# Patient Record
Sex: Female | Born: 1945 | Race: White | Hispanic: No | Smoking: Former smoker
Health system: Southern US, Community
[De-identification: ages and names within clinical notes are randomized; demographics above are authoritative.]

## PROBLEM LIST (undated history)

## (undated) DIAGNOSIS — J42 Unspecified chronic bronchitis: Secondary | ICD-10-CM

## (undated) DIAGNOSIS — J189 Pneumonia, unspecified organism: Secondary | ICD-10-CM

## (undated) DIAGNOSIS — R0601 Orthopnea: Secondary | ICD-10-CM

## (undated) DIAGNOSIS — I2510 Atherosclerotic heart disease of native coronary artery without angina pectoris: Secondary | ICD-10-CM

## (undated) DIAGNOSIS — F419 Anxiety disorder, unspecified: Secondary | ICD-10-CM

## (undated) DIAGNOSIS — E785 Hyperlipidemia, unspecified: Secondary | ICD-10-CM

## (undated) DIAGNOSIS — M549 Dorsalgia, unspecified: Secondary | ICD-10-CM

## (undated) DIAGNOSIS — I219 Acute myocardial infarction, unspecified: Secondary | ICD-10-CM

## (undated) DIAGNOSIS — R0600 Dyspnea, unspecified: Secondary | ICD-10-CM

## (undated) DIAGNOSIS — F329 Major depressive disorder, single episode, unspecified: Secondary | ICD-10-CM

## (undated) DIAGNOSIS — G8929 Other chronic pain: Secondary | ICD-10-CM

## (undated) DIAGNOSIS — J449 Chronic obstructive pulmonary disease, unspecified: Secondary | ICD-10-CM

## (undated) DIAGNOSIS — M1990 Unspecified osteoarthritis, unspecified site: Secondary | ICD-10-CM

## (undated) DIAGNOSIS — R062 Wheezing: Secondary | ICD-10-CM

## (undated) DIAGNOSIS — N289 Disorder of kidney and ureter, unspecified: Secondary | ICD-10-CM

## (undated) DIAGNOSIS — R011 Cardiac murmur, unspecified: Secondary | ICD-10-CM

## (undated) DIAGNOSIS — F32A Depression, unspecified: Secondary | ICD-10-CM

## (undated) DIAGNOSIS — Z8711 Personal history of peptic ulcer disease: Secondary | ICD-10-CM

## (undated) DIAGNOSIS — I10 Essential (primary) hypertension: Secondary | ICD-10-CM

## (undated) HISTORY — DX: Major depressive disorder, single episode, unspecified: F32.9

## (undated) HISTORY — DX: Depression, unspecified: F32.A

## (undated) HISTORY — PX: LACERATION REPAIR: SHX5168

## (undated) HISTORY — DX: Atherosclerotic heart disease of native coronary artery without angina pectoris: I25.10

## (undated) HISTORY — PX: ABDOMINAL HYSTERECTOMY: SHX81

## (undated) HISTORY — DX: Essential (primary) hypertension: I10

## (undated) HISTORY — DX: Dorsalgia, unspecified: M54.9

## (undated) HISTORY — DX: Other chronic pain: G89.29

## (undated) HISTORY — PX: BLADDER SUSPENSION: SHX72

## (undated) HISTORY — DX: Anxiety disorder, unspecified: F41.9

## (undated) HISTORY — DX: Disorder of kidney and ureter, unspecified: N28.9

## (undated) HISTORY — DX: Hyperlipidemia, unspecified: E78.5

## (undated) HISTORY — PX: TIBIA FRACTURE SURGERY: SHX806

---

## 2001-11-30 HISTORY — PX: CORONARY ARTERY BYPASS GRAFT: SHX141

## 2006-05-04 ENCOUNTER — Emergency Department: Payer: Self-pay | Admitting: Emergency Medicine

## 2011-05-07 ENCOUNTER — Observation Stay: Payer: Self-pay | Admitting: Internal Medicine

## 2011-05-07 DIAGNOSIS — R079 Chest pain, unspecified: Secondary | ICD-10-CM

## 2011-05-27 ENCOUNTER — Encounter: Payer: Self-pay | Admitting: Cardiovascular Disease

## 2014-01-22 DIAGNOSIS — M159 Polyosteoarthritis, unspecified: Secondary | ICD-10-CM | POA: Insufficient documentation

## 2014-01-22 DIAGNOSIS — J449 Chronic obstructive pulmonary disease, unspecified: Secondary | ICD-10-CM | POA: Insufficient documentation

## 2014-01-22 DIAGNOSIS — F17200 Nicotine dependence, unspecified, uncomplicated: Secondary | ICD-10-CM | POA: Insufficient documentation

## 2014-01-22 DIAGNOSIS — I2510 Atherosclerotic heart disease of native coronary artery without angina pectoris: Secondary | ICD-10-CM | POA: Insufficient documentation

## 2015-03-15 ENCOUNTER — Emergency Department
Admit: 2015-03-15 | Disposition: A | Discharge status: Home / Self Care | Payer: Self-pay | Admitting: Emergency Medicine

## 2015-09-07 ENCOUNTER — Emergency Department
Admission: EM | Admit: 2015-09-07 | Discharge: 2015-09-07 | Disposition: A | Discharge status: Home / Self Care | Payer: Medicare Other | Attending: Emergency Medicine | Admitting: Emergency Medicine

## 2015-09-07 ENCOUNTER — Encounter: Payer: Self-pay | Admitting: *Deleted

## 2015-09-07 ENCOUNTER — Emergency Department: Payer: Medicare Other

## 2015-09-07 DIAGNOSIS — Z720 Tobacco use: Secondary | ICD-10-CM | POA: Diagnosis not present

## 2015-09-07 DIAGNOSIS — Z7951 Long term (current) use of inhaled steroids: Secondary | ICD-10-CM | POA: Insufficient documentation

## 2015-09-07 DIAGNOSIS — Z7982 Long term (current) use of aspirin: Secondary | ICD-10-CM | POA: Insufficient documentation

## 2015-09-07 DIAGNOSIS — Z79899 Other long term (current) drug therapy: Secondary | ICD-10-CM | POA: Diagnosis not present

## 2015-09-07 DIAGNOSIS — J441 Chronic obstructive pulmonary disease with (acute) exacerbation: Secondary | ICD-10-CM | POA: Insufficient documentation

## 2015-09-07 DIAGNOSIS — I10 Essential (primary) hypertension: Secondary | ICD-10-CM | POA: Insufficient documentation

## 2015-09-07 DIAGNOSIS — R0682 Tachypnea, not elsewhere classified: Secondary | ICD-10-CM | POA: Diagnosis not present

## 2015-09-07 DIAGNOSIS — R0602 Shortness of breath: Secondary | ICD-10-CM | POA: Diagnosis present

## 2015-09-07 DIAGNOSIS — J069 Acute upper respiratory infection, unspecified: Secondary | ICD-10-CM

## 2015-09-07 LAB — TROPONIN I: Troponin I: <0.03 ng/mL (ref <0.031)

## 2015-09-07 LAB — CBC
HEMATOCRIT: 42.4 % (ref 35.0–47.0)
Hemoglobin: 13.8 g/dL (ref 12.0–16.0)
MCH: 29.6 pg (ref 26.0–34.0)
MCHC: 32.5 g/dL (ref 32.0–36.0)
MCV: 90.9 fL (ref 80.0–100.0)
PLATELETS: 338 10*3/uL (ref 150–440)
RBC: 4.66 MIL/uL (ref 3.80–5.20)
RDW: 14.7 % — ABNORMAL HIGH (ref 11.5–14.5)
WBC: 12.4 10*3/uL — ABNORMAL HIGH (ref 3.6–11.0)

## 2015-09-07 LAB — COMPREHENSIVE METABOLIC PANEL
ALBUMIN: 4.9 g/dL (ref 3.5–5.0)
ALK PHOS: 92 U/L (ref 38–126)
ALT: 15 U/L (ref 14–54)
AST: 20 U/L (ref 15–41)
Anion gap: 9 (ref 5–15)
BUN: 14 mg/dL (ref 6–20)
CALCIUM: 9.8 mg/dL (ref 8.9–10.3)
CO2: 26 mmol/L (ref 22–32)
CREATININE: 0.95 mg/dL (ref 0.44–1.00)
Chloride: 106 mmol/L (ref 101–111)
GFR calc Af Amer: >60 mL/min (ref >60)
GFR calc non Af Amer: 59 mL/min — ABNORMAL LOW (ref >60)
GLUCOSE: 100 mg/dL — AB (ref 65–99)
Potassium: 4.6 mmol/L (ref 3.5–5.1)
SODIUM: 141 mmol/L (ref 135–145)
Total Bilirubin: 0.5 mg/dL (ref 0.3–1.2)
Total Protein: 7.9 g/dL (ref 6.5–8.1)

## 2015-09-07 MED ORDER — AZITHROMYCIN 250 MG PO TABS
500.0000 mg | ORAL_TABLET | Freq: Once | ORAL | Status: AC
  Administered 2015-09-07: 500 mg via ORAL
  Filled 2015-09-07: qty 2

## 2015-09-07 MED ORDER — METOPROLOL TARTRATE 25 MG PO TABS: 25.0000 mg | ORAL_TABLET | Freq: Once | ORAL | Status: DC

## 2015-09-07 MED ORDER — HYDROCHLOROTHIAZIDE 25 MG PO TABS: 25.0000 mg | ORAL_TABLET | Freq: Every day | ORAL | Status: DC

## 2015-09-07 MED ORDER — PREDNISONE 20 MG PO TABS
60.0000 mg | ORAL_TABLET | Freq: Once | ORAL | Status: AC
  Administered 2015-09-07: 60 mg via ORAL
  Filled 2015-09-07: qty 3

## 2015-09-07 MED ORDER — IPRATROPIUM-ALBUTEROL 0.5-2.5 (3) MG/3ML IN SOLN
3.0000 mL | Freq: Once | RESPIRATORY_TRACT | Status: AC
  Administered 2015-09-07: 3 mL via RESPIRATORY_TRACT
  Filled 2015-09-07: qty 3

## 2015-09-07 MED ORDER — ALBUTEROL SULFATE (2.5 MG/3ML) 0.083% IN NEBU
5.0000 mg | INHALATION_SOLUTION | Freq: Once | RESPIRATORY_TRACT | Status: AC
  Administered 2015-09-07: 5 mg via RESPIRATORY_TRACT
  Filled 2015-09-07: qty 6

## 2015-09-07 MED ORDER — AZITHROMYCIN 250 MG PO TABS: ORAL_TABLET | ORAL | Status: AC

## 2015-09-07 MED ORDER — PREDNISONE 20 MG PO TABS: 60.0000 mg | ORAL_TABLET | Freq: Every day | ORAL | Status: DC

## 2015-09-07 MED ORDER — ALBUTEROL SULFATE HFA 108 (90 BASE) MCG/ACT IN AERS: 2.0000 | INHALATION_SPRAY | RESPIRATORY_TRACT | Status: DC | PRN

## 2015-09-07 NOTE — ED Notes (Signed)
Pt coughing throughout blood draw and EKG. Pt could not finish many sentences without beginning to cough excessively. Pt reports having productive cough and home but does not have productive sputum while in triage.

## 2015-09-07 NOTE — ED Notes (Signed)
Pt reports asthma flaring up x 3 weeks. Expiratory wheezing in triage. +Cough.

## 2015-09-07 NOTE — Discharge Instructions (Signed)
Please take the entire course of prednisone, even if you're feeling better. Please take the entire course of antibiotics, even if you're feeling better. Please take the albuterol inhaler as needed. Next  Return to the emergency Department if you develop shortness of breath, chest pain, fainting or lightheadedness, fever, or any other symptoms concerning to you.  It is very important that you call the [facility] to establish a primary care physician. This person will be able to be managed your high blood pressure and your COPD and asthma.

## 2015-09-07 NOTE — ED Provider Notes (Signed)
[facility] Emergency Department Provider Note  ____________________________________________  Time seen: Approximately 6:12 PM  I have reviewed the triage vital signs and the nursing notes.   HISTORY  Chief Complaint Asthma    HPI Katherine Price is a 69 y.o. female with ongoing tobacco abuse, asthma and COPD, presenting for shortness of breath and wheezing. Patient reports that for the past 3 weeks she has had a nonproductive cough with intermittent subjective fevers. She was previously stabilized on 3 inhalers for her pulmonary disease but has been off of all of her medications. Her symptoms did improve with an albuterol MDI that she borrowed from a friend.Her symptoms are worse if she ambulates, but she denies any chest pain, syncope. She does occasionally get lightheaded.   Past Medical History  Diagnosis Date  . Coronary artery disease   . Hypertension   . Hyperlipidemia   . Depression   . Anxiety   . Renal insufficiency   . Chronic back pain     There are no active problems to display for this patient.   Past Surgical History  Procedure Laterality Date  . Coronary artery bypass graft  2003    Current Outpatient Rx  Name  Route  Sig  Dispense  Refill  . albuterol (PROVENTIL HFA;VENTOLIN HFA) 108 (90 BASE) MCG/ACT inhaler   Inhalation   Inhale 2 puffs into the lungs every 4 (four) hours as needed for wheezing or shortness of breath.   1 Inhaler   0   . ALPRAZolam (XANAX) 0.25 MG tablet   Oral   Take 0.25 mg by mouth at bedtime as needed.           Marland Kitchen aspirin 81 MG tablet   Oral   Take 81 mg by mouth daily.           Marland Kitchen atorvastatin (LIPITOR) 40 MG tablet   Oral   Take 40 mg by mouth daily.           Marland Kitchen azithromycin (ZITHROMAX Z-PAK) 250 MG tablet      Take 1 tablet daily for four days   4 tablet   0   . enalapril (VASOTEC) 20 MG tablet   Oral   Take 20 mg by mouth daily.           Marland Kitchen escitalopram (LEXAPRO) 10 MG  tablet   Oral   Take 10 mg by mouth daily.           . fluticasone (FLONASE) 50 MCG/ACT nasal spray   Nasal   Place 2 sprays into the nose daily.           . hydrochlorothiazide (HYDRODIURIL) 25 MG tablet   Oral   Take 1 tablet (25 mg total) by mouth daily.   30 tablet   0   . metoprolol succinate (TOPROL XL) 25 MG 24 hr tablet   Oral   Take 25 mg by mouth daily.           . nitroGLYCERIN (NITROSTAT) 0.4 MG SL tablet   Sublingual   Place 0.4 mg under the tongue every 5 (five) minutes as needed.           Marland Kitchen oxybutynin (DITROPAN) 5 MG tablet   Oral   Take 5 mg by mouth 1 dose over 24 hours.           . predniSONE (DELTASONE) 20 MG tablet   Oral   Take 3 tablets (60 mg total) by mouth  daily.   15 tablet   0     Allergies Review of patient's allergies indicates no known allergies.  No family history on file.  Social History Social History  Substance Use Topics  . Smoking status: Current Every Day Smoker  . Smokeless tobacco: None  . Alcohol Use: No    Review of Systems Constitutional: Positive for subjective fever. No chills. Lightheadedness but no syncope. Eyes: No visual changes. ENT: No sore throat. Cardiovascular: No palpitations. Occasional central chest pain associated with coughing. Respiratory: Exertional shortness of breath. Nonproductive cough. Gastrointestinal: No abdominal pain.  No nausea, no vomiting.  No diarrhea.  No constipation. Genitourinary: Negative for dysuria. Musculoskeletal: Negative for back pain. Skin: Negative for rash. Neurological: Occasional headache after albuterol, focal weakness or numbness.  10-point ROS otherwise negative.  ____________________________________________   PHYSICAL EXAM:  VITAL SIGNS: ED Triage Vitals  Enc Vitals Group     BP 09/07/15 1456 166/115 mmHg     Pulse Rate 09/07/15 1456 125     Resp 09/07/15 1456 24     Temp 09/07/15 1456 98.3 F (36.8 C)     Temp Source 09/07/15 1456 Oral      SpO2 09/07/15 1456 92 %     Weight 09/07/15 1456 85 lb (38.556 kg)     Height 09/07/15 1456 5\' 4"  (1.626 m)     Head Cir --      Peak Flow --      Pain Score --      Pain Loc --      Pain Edu? --      Excl. in GC? --     Constitutional: Alert and oriented. Well appearing and able to speak in full sentences. Eyes: Conjunctivae are normal.  EOMI. Head: Atraumatic. Nose: No congestion/rhinnorhea. Mouth/Throat: Mucous membranes are moist.  Neck: No stridor.  Supple. No JVD  Cardiovascular: Normal rate, regular rhythm. No murmurs, rubs or gallops.  Respiratory: Mild tachypnea without accessory muscle use or retractions. No respiratory distress. Full breath sounds bilaterally. End expiratory wheezes and mild rales in the bases bilaterally. Gastrointestinal: Soft and nontender. No distention. No peritoneal signs. Musculoskeletal: No LE edema.  Neurologic:  Normal speech and language. No gross focal neurologic deficits are appreciated.  Skin:  Skin is warm, dry and intact. No rash noted. Psychiatric: Mood and affect are normal. Speech and behavior are normal.  Normal judgement.  ____________________________________________   LABS (all labs ordered are listed, but only abnormal results are displayed)  Labs Reviewed  COMPREHENSIVE METABOLIC PANEL - Abnormal; Notable for the following:    Glucose, Bld 100 (*)    GFR calc non Af Amer 59 (*)    All other components within normal limits  CBC - Abnormal; Notable for the following:    WBC 12.4 (*)    RDW 14.7 (*)    All other components within normal limits  TROPONIN I   ____________________________________________  EKG  ED ECG REPORT I, Rockne Menghini, the attending physician, personally viewed and interpreted this ECG.   Date: 09/07/2015  EKG Time: 1730  Rate: 81  Rhythm: sinus with sinus arrhythmia  Axis: normal  Intervals:none  ST&T Change: twave inversions in V1-V6; there are no EKGs for comparison in our system.  There is no ST elevation or reciprocal changes.  ____________________________________________  RADIOLOGY  Dg Chest 2 View  09/07/2015   CLINICAL DATA:  Cough, wheezing.  EXAM: CHEST  2 VIEW  COMPARISON:  October 31, 2011.  FINDINGS: The  heart size and mediastinal contours are within normal limits. Both lungs are clear. No pneumothorax or pleural effusion is noted. Sternotomy wires are noted. 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  IMPRESSION: No active cardiopulmonary disease.   Electronically Signed   By: Lupita Raider, M.D.   On: 09/07/2015 15:59    ____________________________________________   PROCEDURES  Procedure(s) performed: None  Critical Care performed: No ____________________________________________   INITIAL IMPRESSION / ASSESSMENT AND PLAN / ED COURSE  Pertinent labs & imaging results that were available during my care of the patient were reviewed by me and considered in my medical decision making (see chart for details).  69 y.o. female with a history of COPD and asthma, not on her chronic meds for these illnesses, presenting with 3 weeks of cough, subjective intermittent fevers cough and worsening shortness of breath. On my exam, the patient does have some end expiratory wheezing that would be consistent with URI exacerbating asthma and COPD. Her EKG shows nonspecific T-wave inversions in I don't have a previous I will repeat this to make sure there are no dynamic changes. Her troponin is negative. Her labs do not show an anemia or other leg slight disturbance that would correlate with her shortness of breath. I will plan a DuoNeb, steroids and azithromycin, and reevaluation after ambulation to see if she is able to maintain her oxygenation.  ----------------------------------------- 7:00 PM on 09/07/2015 -----------------------------------------  The patient was able to ambulate and maintain sats in the high 90s. After ambulation, her  blood pressure did elevate, but she states that she does have a past diagnosis of hypertension and has not been on her medications for several years. She is not having any active chest pain after walking. We will plan discharge with instructions for her to make an appointment with her primary care physician for reevaluation of her blood pressure. I will start her on HCTZ in the meantime.  ____________________________________________  FINAL CLINICAL IMPRESSION(S) / ED DIAGNOSES  Final diagnoses:  Upper respiratory infection  COPD with acute exacerbation (HCC)  Essential hypertension      NEW MEDICATIONS STARTED DURING THIS VISIT:  Discharge Medication List as of 09/07/2015  7:07 PM    START taking these medications   Details  albuterol (PROVENTIL HFA;VENTOLIN HFA) 108 (90 BASE) MCG/ACT inhaler Inhale 2 puffs into the lungs every 4 (four) hours as needed for wheezing or shortness of breath., Starting 09/07/2015, Until Discontinued, Print    azithromycin (ZITHROMAX Z-PAK) 250 MG tablet Take 1 tablet daily for four days, Print    hydrochlorothiazide (HYDRODIURIL) 25 MG tablet Take 1 tablet (25 mg total) by mouth daily., Starting 09/07/2015, Until Discontinued, Print    predniSONE (DELTASONE) 20 MG tablet Take 3 tablets (60 mg total) by mouth daily., Starting 09/07/2015, Until Discontinued, Print         Rockne Menghini, MD 09/07/15 2210

## 2015-10-18 ENCOUNTER — Emergency Department
Admission: EM | Admit: 2015-10-18 | Discharge: 2015-10-18 | Disposition: A | Discharge status: Home / Self Care | Payer: Medicare Other | Attending: Emergency Medicine | Admitting: Emergency Medicine

## 2015-10-18 ENCOUNTER — Emergency Department: Payer: Medicare Other

## 2015-10-18 ENCOUNTER — Encounter: Payer: Self-pay | Admitting: *Deleted

## 2015-10-18 DIAGNOSIS — F17210 Nicotine dependence, cigarettes, uncomplicated: Secondary | ICD-10-CM | POA: Diagnosis not present

## 2015-10-18 DIAGNOSIS — I10 Essential (primary) hypertension: Secondary | ICD-10-CM | POA: Diagnosis not present

## 2015-10-18 DIAGNOSIS — Z7982 Long term (current) use of aspirin: Secondary | ICD-10-CM | POA: Diagnosis not present

## 2015-10-18 DIAGNOSIS — Z7951 Long term (current) use of inhaled steroids: Secondary | ICD-10-CM | POA: Insufficient documentation

## 2015-10-18 DIAGNOSIS — Z79899 Other long term (current) drug therapy: Secondary | ICD-10-CM | POA: Insufficient documentation

## 2015-10-18 DIAGNOSIS — J441 Chronic obstructive pulmonary disease with (acute) exacerbation: Secondary | ICD-10-CM | POA: Insufficient documentation

## 2015-10-18 DIAGNOSIS — R0602 Shortness of breath: Secondary | ICD-10-CM | POA: Diagnosis present

## 2015-10-18 LAB — COMPREHENSIVE METABOLIC PANEL
ALT: 12 U/L — AB (ref 14–54)
ANION GAP: 7 (ref 5–15)
AST: 19 U/L (ref 15–41)
Albumin: 4.1 g/dL (ref 3.5–5.0)
Alkaline Phosphatase: 81 U/L (ref 38–126)
BUN: 11 mg/dL (ref 6–20)
CHLORIDE: 109 mmol/L (ref 101–111)
CO2: 26 mmol/L (ref 22–32)
CREATININE: 0.93 mg/dL (ref 0.44–1.00)
Calcium: 9 mg/dL (ref 8.9–10.3)
Glucose, Bld: 110 mg/dL — ABNORMAL HIGH (ref 65–99)
POTASSIUM: 4 mmol/L (ref 3.5–5.1)
SODIUM: 142 mmol/L (ref 135–145)
Total Bilirubin: <0.1 mg/dL — ABNORMAL LOW (ref 0.3–1.2)
Total Protein: 7.1 g/dL (ref 6.5–8.1)

## 2015-10-18 LAB — CBC
HCT: 39.6 % (ref 35.0–47.0)
Hemoglobin: 13.6 g/dL (ref 12.0–16.0)
MCH: 31.3 pg (ref 26.0–34.0)
MCHC: 34.4 g/dL (ref 32.0–36.0)
MCV: 91.1 fL (ref 80.0–100.0)
PLATELETS: 312 10*3/uL (ref 150–440)
RBC: 4.34 MIL/uL (ref 3.80–5.20)
RDW: 14.3 % (ref 11.5–14.5)
WBC: 9.7 10*3/uL (ref 3.6–11.0)

## 2015-10-18 LAB — TROPONIN I

## 2015-10-18 MED ORDER — IPRATROPIUM-ALBUTEROL 0.5-2.5 (3) MG/3ML IN SOLN
3.0000 mL | Freq: Once | RESPIRATORY_TRACT | Status: AC
  Administered 2015-10-18: 3 mL via RESPIRATORY_TRACT
  Filled 2015-10-18: qty 3

## 2015-10-18 MED ORDER — ALBUTEROL SULFATE (2.5 MG/3ML) 0.083% IN NEBU
5.0000 mg | INHALATION_SOLUTION | Freq: Once | RESPIRATORY_TRACT | Status: AC
  Administered 2015-10-18: 5 mg via RESPIRATORY_TRACT
  Filled 2015-10-18: qty 6

## 2015-10-18 MED ORDER — PREDNISONE 20 MG PO TABS: 40.0000 mg | ORAL_TABLET | Freq: Every day | ORAL | Status: DC

## 2015-10-18 MED ORDER — AZITHROMYCIN 250 MG PO TABS
ORAL_TABLET | ORAL | Status: AC
Start: 2015-10-18 — End: 2015-10-23

## 2015-10-18 MED ORDER — PREDNISONE 20 MG PO TABS
60.0000 mg | ORAL_TABLET | Freq: Once | ORAL | Status: AC
  Administered 2015-10-18: 60 mg via ORAL
  Filled 2015-10-18: qty 3

## 2015-10-18 MED ORDER — AZITHROMYCIN 250 MG PO TABS
500.0000 mg | ORAL_TABLET | Freq: Once | ORAL | Status: AC
  Administered 2015-10-18: 500 mg via ORAL
  Filled 2015-10-18: qty 2

## 2015-10-18 NOTE — ED Notes (Signed)
Pt reports feeling short of breath with coughing and chest pain, pt was treated last month for a URI

## 2015-10-18 NOTE — ED Provider Notes (Signed)
[facility] Emergency Department Provider Note  Time seen: 6:17 PM  I have reviewed the triage vital signs and the nursing notes.   HISTORY  Chief Complaint Shortness of Breath    HPI Katherine Price is a 69 y.o. female with a past medical history of CAD, hypertension, hyperlipidemia, anxiety, CK D, COPD who presents to the emergency department with difficulty breathing.According to the patient for the past 5-6 weeks she has had intermittent flares of dyspnea, as well as chest discomfort when coughing. She was seen last month and diagnosed with a COPD exacerbation with likely upper respiratory infection. She states she did a course of antibiotics and steroids and felt considerably better until the last few days. Patient states she believes it is the environmental smoke that has set off her COPD once again. Patient denies any fever. She states she has been coughing but denies any sputum production. States some chest pain with cough but denies any chest pain at rest. Describes her shortness of breath is moderate.     Past Medical History  Diagnosis Date  . Coronary artery disease   . Hypertension   . Hyperlipidemia   . Depression   . Anxiety   . Renal insufficiency   . Chronic back pain     There are no active problems to display for this patient.   Past Surgical History  Procedure Laterality Date  . Coronary artery bypass graft  2003    Current Outpatient Rx  Name  Route  Sig  Dispense  Refill  . albuterol (PROVENTIL HFA;VENTOLIN HFA) 108 (90 BASE) MCG/ACT inhaler   Inhalation   Inhale 2 puffs into the lungs every 4 (four) hours as needed for wheezing or shortness of breath.   1 Inhaler   0   . ALPRAZolam (XANAX) 0.25 MG tablet   Oral   Take 0.25 mg by mouth at bedtime as needed.           Marland Kitchen aspirin 81 MG tablet   Oral   Take 81 mg by mouth daily.           Marland Kitchen atorvastatin (LIPITOR) 40 MG tablet   Oral   Take 40 mg by mouth daily.          . enalapril (VASOTEC) 20 MG tablet   Oral   Take 20 mg by mouth daily.           Marland Kitchen escitalopram (LEXAPRO) 10 MG tablet   Oral   Take 10 mg by mouth daily.           . fluticasone (FLONASE) 50 MCG/ACT nasal spray   Nasal   Place 2 sprays into the nose daily.           . hydrochlorothiazide (HYDRODIURIL) 25 MG tablet   Oral   Take 1 tablet (25 mg total) by mouth daily.   30 tablet   0   . metoprolol succinate (TOPROL XL) 25 MG 24 hr tablet   Oral   Take 25 mg by mouth daily.           . nitroGLYCERIN (NITROSTAT) 0.4 MG SL tablet   Sublingual   Place 0.4 mg under the tongue every 5 (five) minutes as needed.           Marland Kitchen oxybutynin (DITROPAN) 5 MG tablet   Oral   Take 5 mg by mouth 1 dose over 24 hours.           Marland Kitchen  predniSONE (DELTASONE) 20 MG tablet   Oral   Take 3 tablets (60 mg total) by mouth daily.   15 tablet   0     Allergies Review of patient's allergies indicates no known allergies.  No family history on file.  Social History Social History  Substance Use Topics  . Smoking status: Current Every Day Smoker -- 1.00 packs/day    Types: Cigarettes  . Smokeless tobacco: None  . Alcohol Use: No    Review of Systems Constitutional: Negative for fever. Cardiovascular: Intermittent chest pain with cough. Denies any chest pain at rest. Respiratory: Positive for shortness of breath. Gastrointestinal: Negative for abdominal pain Musculoskeletal: Negative for back pain Neurological: Negative for headache 10-point ROS otherwise negative.  ____________________________________________   PHYSICAL EXAM:  VITAL SIGNS: ED Triage Vitals  Enc Vitals Group     BP 10/18/15 1722 162/100 mmHg     Pulse Rate 10/18/15 1722 88     Resp 10/18/15 1722 20     Temp 10/18/15 1722 98.2 F (36.8 C)     Temp Source 10/18/15 1722 Oral     SpO2 10/18/15 1722 96 %     Weight 10/18/15 1722 85 lb (38.556 kg)     Height 10/18/15 1722  (1.626 m)      Head Cir --      Peak Flow --      Pain Score 10/18/15 1723 5     Pain Loc --      Pain Edu? --      Excl. in GC? --     Constitutional: Alert and oriented. Well appearing and in no distress. Eyes: Normal exam ENT   Head: Normocephalic and atraumatic.   Mouth/Throat: Mucous membranes are moist. Cardiovascular: Normal rate, regular rhythm. No murmur Respiratory: Moderate expiratory wheeze bilaterally. No rales, rhonchi. Gastrointestinal: Soft and nontender. No distention.   Musculoskeletal: Nontender with normal range of motion in all extremities. No lower extremity tenderness or edema. Neurologic:  Normal speech and language. No gross focal neurologic deficits  Skin:  Skin is warm, dry and intact.  Psychiatric: Mood and affect are normal. Speech and behavior are normal.  ____________________________________________    EKG  EKG reviewed and interpreted by myself shows sinus rhythm at 85 bpm, narrow QRS, normal axis, normal intervals, nonspecific ST changes present, with lateral T-wave inversions. Patient appears to have T-wave changes on 09-22-15, somewhat more pronounced currently, but this could be positional or due to lead placement.  ____________________________________________    RADIOLOGY  Chest x-ray shows no acute findings.  ____________________________________________    INITIAL IMPRESSION / ASSESSMENT AND PLAN / ED COURSE  Pertinent labs & imaging results that were available during my care of the patient were reviewed by me and considered in my medical decision making (see chart for details).  Patient presents with trouble breathing, cough and intermittent chest pain while coughing. Patient has a history of COPD, moderate wheeze on exam. We'll place the patient prednisone, treat with DuoNeb's while awaiting lab results and chest x-ray. EKG appears to show some lateral T-wave inversions which appeared to be present in prior EKGs but somewhat more pronounced  currently.  Chest x-ray shows no acute findings, labs are largely within normal limits. Patient with likely COPD exacerbation.  ____________________________________________   FINAL CLINICAL IMPRESSION(S) / ED DIAGNOSES  Dyspnea   Minna Antis, MD 10/18/15 [phone]

## 2015-10-18 NOTE — Discharge Instructions (Signed)
Chronic Obstructive Pulmonary Disease Chronic obstructive pulmonary disease (COPD) is a common lung condition in which airflow from the lungs is limited. COPD is a general term that can be used to describe many different lung problems that limit airflow, including both chronic bronchitis and emphysema. If you have COPD, your lung function will probably never return to normal, but there are measures you can take to improve lung function and make yourself feel better. CAUSES   Smoking (common).  Exposure to secondhand smoke.  Genetic problems.  Chronic inflammatory lung diseases or recurrent infections. SYMPTOMS  Shortness of breath, especially with physical activity.  Deep, persistent (chronic) cough with a large amount of thick mucus.  Wheezing.  Rapid breaths (tachypnea).  Gray or bluish discoloration (cyanosis) of the skin, especially in your fingers, toes, or lips.  Fatigue.  Weight loss.  Frequent infections or episodes when breathing symptoms become much worse (exacerbations).  Chest tightness. DIAGNOSIS Your health care provider will take a medical history and perform a physical examination to diagnose COPD. Additional tests for COPD may include:  Lung (pulmonary) function tests.  Chest X-ray.  CT scan.  Blood tests. TREATMENT  Treatment for COPD may include:  Inhaler and nebulizer medicines. These help manage the symptoms of COPD and make your breathing more comfortable.  Supplemental oxygen. Supplemental oxygen is only helpful if you have a low oxygen level in your blood.  Exercise and physical activity. These are beneficial for nearly all people with COPD.  Lung surgery or transplant.  Nutrition therapy to gain weight, if you are underweight.  Pulmonary rehabilitation. This may involve working with a team of health care providers and specialists, such as respiratory, occupational, and physical therapists. HOME CARE INSTRUCTIONS  Take all medicines  (inhaled or pills) as directed by your health care provider.  Avoid over-the-counter medicines or cough syrups that dry up your airway (such as antihistamines) and slow down the elimination of secretions unless instructed otherwise by your health care provider.  If you are a smoker, the most important thing that you can do is stop smoking. Continuing to smoke will cause further lung damage and breathing trouble. Ask your health care provider for help with quitting smoking. He or she can direct you to community resources or hospitals that provide support.  Avoid exposure to irritants such as smoke, chemicals, and fumes that aggravate your breathing.  Use oxygen therapy and pulmonary rehabilitation if directed by your health care provider. If you require home oxygen therapy, ask your health care provider whether you should purchase a pulse oximeter to measure your oxygen level at home.  Avoid contact with individuals who have a contagious illness.  Avoid extreme temperature and humidity changes.  Eat healthy foods. Eating smaller, more frequent meals and resting before meals may help you maintain your strength.  Stay active, but balance activity with periods of rest. Exercise and physical activity will help you maintain your ability to do things you want to do.  Preventing infection and hospitalization is very important when you have COPD. Make sure to receive all the vaccines your health care provider recommends, especially the pneumococcal and influenza vaccines. Ask your health care provider whether you need a pneumonia vaccine.  Learn and use relaxation techniques to manage stress.  Learn and use controlled breathing techniques as directed by your health care provider. Controlled breathing techniques include:  Pursed lip breathing. Start by breathing in (inhaling) through your nose for 1 second. Then, purse your lips as if you were   going to whistle and breathe out (exhale) through the  pursed lips for 2 seconds.  Diaphragmatic breathing. Start by putting one hand on your abdomen just above your waist. Inhale slowly through your nose. The hand on your abdomen should move out. Then purse your lips and exhale slowly. You should be able to feel the hand on your abdomen moving in as you exhale.  Learn and use controlled coughing to clear mucus from your lungs. Controlled coughing is a series of short, progressive coughs. The steps of controlled coughing are: 1. Lean your head slightly forward. 2. Breathe in deeply using diaphragmatic breathing. 3. Try to hold your breath for 3 seconds. 4. Keep your mouth slightly open while coughing twice. 5. Spit any mucus out into a tissue. 6. Rest and repeat the steps once or twice as needed. SEEK MEDICAL CARE IF:  You are coughing up more mucus than usual.  There is a change in the color or thickness of your mucus.  Your breathing is more labored than usual.  Your breathing is faster than usual. SEEK IMMEDIATE MEDICAL CARE IF:  You have shortness of breath while you are resting.  You have shortness of breath that prevents you from:  Being able to talk.  Performing your usual physical activities.  You have chest pain lasting longer than 5 minutes.  Your skin color is more cyanotic than usual.  You measure low oxygen saturations for longer than 5 minutes with a pulse oximeter. MAKE SURE YOU:  Understand these instructions.  Will watch your condition.  Will get help right away if you are not doing well or get worse.   This information is not intended to replace advice given to you by your health care provider. Make sure you discuss any questions you have with your health care provider.   Document Released: 08/26/2005 Document Revised: 12/07/2014 Document Reviewed: 07/13/2013 Elsevier Interactive Patient Education 2016 [employer]  

## 2015-10-18 NOTE — ED Notes (Signed)
Pt. Going home with family. 

## 2015-12-09 ENCOUNTER — Emergency Department
Admission: EM | Admit: 2015-12-09 | Discharge: 2015-12-09 | Disposition: A | Discharge status: Home / Self Care | Payer: Medicare Other | Attending: Emergency Medicine | Admitting: Emergency Medicine

## 2015-12-09 ENCOUNTER — Encounter: Payer: Self-pay | Admitting: Emergency Medicine

## 2015-12-09 DIAGNOSIS — Z79899 Other long term (current) drug therapy: Secondary | ICD-10-CM | POA: Diagnosis not present

## 2015-12-09 DIAGNOSIS — Z7951 Long term (current) use of inhaled steroids: Secondary | ICD-10-CM | POA: Insufficient documentation

## 2015-12-09 DIAGNOSIS — L259 Unspecified contact dermatitis, unspecified cause: Secondary | ICD-10-CM | POA: Insufficient documentation

## 2015-12-09 DIAGNOSIS — Z7982 Long term (current) use of aspirin: Secondary | ICD-10-CM | POA: Insufficient documentation

## 2015-12-09 DIAGNOSIS — I10 Essential (primary) hypertension: Secondary | ICD-10-CM | POA: Insufficient documentation

## 2015-12-09 DIAGNOSIS — F17210 Nicotine dependence, cigarettes, uncomplicated: Secondary | ICD-10-CM | POA: Diagnosis not present

## 2015-12-09 DIAGNOSIS — R21 Rash and other nonspecific skin eruption: Secondary | ICD-10-CM | POA: Diagnosis present

## 2015-12-09 DIAGNOSIS — Z7952 Long term (current) use of systemic steroids: Secondary | ICD-10-CM | POA: Insufficient documentation

## 2015-12-09 MED ORDER — METHYLPREDNISOLONE 4 MG PO TBPK: ORAL_TABLET | ORAL | Status: DC

## 2015-12-09 MED ORDER — HYDROXYZINE HCL 50 MG PO TABS: 50.0000 mg | ORAL_TABLET | Freq: Three times a day (TID) | ORAL | Status: DC | PRN

## 2015-12-09 MED ORDER — HYDROXYZINE HCL 50 MG PO TABS
50.0000 mg | ORAL_TABLET | Freq: Once | ORAL | Status: AC
  Administered 2015-12-09: 50 mg via ORAL
  Filled 2015-12-09: qty 1

## 2015-12-09 NOTE — ED Provider Notes (Signed)
[facility] Emergency Department Provider Note  ____________________________________________  Time seen: Approximately 6:03 PM  I have reviewed the triage vital signs and the nursing notes.   HISTORY  Chief Complaint Rash    HPI Katherine Price is a 70 y.o. female patient complaining of rash to bilateral arms. Patient state rash has a burning and itching sensation. Patient states she was exposed to shingles. Patient stated this the first episode of having this type of rash. Patient denies any new foods, personal hygiene, or laundry products. Patient stated onset was 3 days ago. Patient states she's tried over-the-counter hydrocortisone cream with no resolution of complaint. Patient rates her pain discomfort as 8/10.   Past Medical History  Diagnosis Date  . Coronary artery disease   . Hypertension   . Hyperlipidemia   . Depression   . Anxiety   . Renal insufficiency   . Chronic back pain     There are no active problems to display for this patient.   Past Surgical History  Procedure Laterality Date  . Coronary artery bypass graft  2003    Current Outpatient Rx  Name  Route  Sig  Dispense  Refill  . albuterol (PROVENTIL HFA;VENTOLIN HFA) 108 (90 BASE) MCG/ACT inhaler   Inhalation   Inhale 2 puffs into the lungs every 4 (four) hours as needed for wheezing or shortness of breath.   1 Inhaler   0   . ALPRAZolam (XANAX) 0.25 MG tablet   Oral   Take 0.25 mg by mouth at bedtime as needed.           Marland Kitchen aspirin 81 MG tablet   Oral   Take 81 mg by mouth daily.           Marland Kitchen atorvastatin (LIPITOR) 40 MG tablet   Oral   Take 40 mg by mouth daily.           . enalapril (VASOTEC) 20 MG tablet   Oral   Take 20 mg by mouth daily.           Marland Kitchen escitalopram (LEXAPRO) 10 MG tablet   Oral   Take 10 mg by mouth daily.           . fluticasone (FLONASE) 50 MCG/ACT nasal spray   Nasal   Place 2 sprays into the nose daily.           .  hydrochlorothiazide (HYDRODIURIL) 25 MG tablet   Oral   Take 1 tablet (25 mg total) by mouth daily.   30 tablet   0   . hydrOXYzine (ATARAX/VISTARIL) 50 MG tablet   Oral   Take 1 tablet (50 mg total) by mouth 3 (three) times daily as needed.   30 tablet   0   . methylPREDNISolone (MEDROL DOSEPAK) 4 MG TBPK tablet      Take Tapered dose as directed   21 tablet   0   . metoprolol succinate (TOPROL XL) 25 MG 24 hr tablet   Oral   Take 25 mg by mouth daily.           . nitroGLYCERIN (NITROSTAT) 0.4 MG SL tablet   Sublingual   Place 0.4 mg under the tongue every 5 (five) minutes as needed.           Marland Kitchen oxybutynin (DITROPAN) 5 MG tablet   Oral   Take 5 mg by mouth 1 dose over 24 hours.           Marland Kitchen  predniSONE (DELTASONE) 20 MG tablet   Oral   Take 2 tablets (40 mg total) by mouth daily.   10 tablet   0     Allergies Review of patient's allergies indicates no known allergies.  History reviewed. No pertinent family history.  Social History Social History  Substance Use Topics  . Smoking status: Current Every Day Smoker -- 1.00 packs/day    Types: Cigarettes  . Smokeless tobacco: None  . Alcohol Use: No    Review of Systems Constitutional: No fever/chills Eyes: No visual changes. ENT: No sore throat. Cardiovascular: Denies chest pain. Respiratory: Denies shortness of breath. Gastrointestinal: No abdominal pain.  No nausea, no vomiting.  No diarrhea.  No constipation. Genitourinary: Negative for dysuria. Musculoskeletal: Negative for back pain. Skin: Positive for rash. Neurological: Negative for headaches, focal weakness or numbness. 10-point ROS otherwise negative.  ____________________________________________   PHYSICAL EXAM:  VITAL SIGNS: ED Triage Vitals  Enc Vitals Group     BP 12/09/15 1722 199/85 mmHg     Pulse Rate 12/09/15 1722 92     Resp 12/09/15 1722 18     Temp 12/09/15 1722 99.1 F (37.3 C)     Temp Source 12/09/15 1722 Oral      SpO2 12/09/15 1722 96 %     Weight 12/09/15 1722 85 lb (38.556 kg)     Height 12/09/15 1722  (1.626 m)     Head Cir --      Peak Flow --      Pain Score 12/09/15 1709 8     Pain Loc --      Pain Edu? --      Excl. in GC? --     Constitutional: Alert and oriented. Well appearing and in no acute distress. Eyes: Conjunctivae are normal. PERRL. EOMI. Head: Atraumatic. Nose: No congestion/rhinnorhea. Mouth/Throat: Mucous membranes are moist.  Oropharynx non-erythematous. Neck: No stridor.  No cervical spine tenderness to palpation. Hematological/Lymphatic/Immunilogical: No cervical lymphadenopathy. Cardiovascular: Normal rate, regular rhythm. Grossly normal heart sounds.  Good peripheral circulation. Elevated blood pressure. Respiratory: Normal respiratory effort.  No retractions. Lungs CTAB. Gastrointestinal: Soft and nontender. No distention. No abdominal bruits. No CVA tenderness. Musculoskeletal: No lower extremity tenderness nor edema.  No joint effusions. Neurologic:  Normal speech and language. No gross focal neurologic deficits are appreciated. No gait instability. Skin:  Skin is warm, dry and intact. No rash noted. Vesicle papular lesions bilateral upper extremity. Signs of excoriation. Signs of secondary infection. Psychiatric: Mood and affect are normal. Speech and behavior are normal.  ____________________________________________   LABS (all labs ordered are listed, but only abnormal results are displayed)  Labs Reviewed - No data to display ____________________________________________  EKG   ____________________________________________  RADIOLOGY   ____________________________________________   PROCEDURES  Procedure(s) performed: None  Critical Care performed: No  ____________________________________________   INITIAL IMPRESSION / ASSESSMENT AND PLAN / ED COURSE  Pertinent labs & imaging results that were available during my care of the patient  were reviewed by me and considered in my medical decision making (see chart for details).  Contact dermatitis. Patient get a prescription for Medrol Dosepak and Atarax. ____________________________________________   FINAL CLINICAL IMPRESSION(S) / ED DIAGNOSES  Final diagnoses:  Contact dermatitis      Joni Reining, PA-C 12/09/15 1816  Emily Filbert, MD 12/10/15 [phone]

## 2015-12-09 NOTE — ED Notes (Addendum)
Pt to ed with c/o rash to bilateral arms and face.  Pt states itching and burning to areas.

## 2015-12-09 NOTE — Discharge Instructions (Signed)
Take medication as directed and follow-up with clinic if no improvement

## 2016-11-05 ENCOUNTER — Telehealth: Payer: Self-pay | Admitting: Pharmacist

## 2016-11-05 ENCOUNTER — Emergency Department: Payer: Medicare Other

## 2016-11-05 ENCOUNTER — Emergency Department
Admission: EM | Admit: 2016-11-05 | Discharge: 2016-11-05 | Disposition: A | Discharge status: Home / Self Care | Payer: Medicare Other | Attending: Student in an Organized Health Care Education/Training Program | Admitting: Student in an Organized Health Care Education/Training Program

## 2016-11-05 ENCOUNTER — Encounter: Payer: Self-pay | Admitting: Emergency Medicine

## 2016-11-05 DIAGNOSIS — F17210 Nicotine dependence, cigarettes, uncomplicated: Secondary | ICD-10-CM | POA: Diagnosis not present

## 2016-11-05 DIAGNOSIS — I2510 Atherosclerotic heart disease of native coronary artery without angina pectoris: Secondary | ICD-10-CM | POA: Diagnosis not present

## 2016-11-05 DIAGNOSIS — Z79899 Other long term (current) drug therapy: Secondary | ICD-10-CM | POA: Diagnosis not present

## 2016-11-05 DIAGNOSIS — I10 Essential (primary) hypertension: Secondary | ICD-10-CM | POA: Insufficient documentation

## 2016-11-05 DIAGNOSIS — H1032 Unspecified acute conjunctivitis, left eye: Secondary | ICD-10-CM | POA: Insufficient documentation

## 2016-11-05 DIAGNOSIS — J441 Chronic obstructive pulmonary disease with (acute) exacerbation: Secondary | ICD-10-CM | POA: Diagnosis not present

## 2016-11-05 DIAGNOSIS — R0602 Shortness of breath: Secondary | ICD-10-CM | POA: Diagnosis present

## 2016-11-05 LAB — TROPONIN I: Troponin I: <0.03 ng/mL (ref <0.03)

## 2016-11-05 LAB — CBC
HEMATOCRIT: 40.9 % (ref 35.0–47.0)
Hemoglobin: 14.1 g/dL (ref 12.0–16.0)
MCH: 30.5 pg (ref 26.0–34.0)
MCHC: 34.4 g/dL (ref 32.0–36.0)
MCV: 88.7 fL (ref 80.0–100.0)
Platelets: 328 10*3/uL (ref 150–440)
RBC: 4.61 MIL/uL (ref 3.80–5.20)
RDW: 14.8 % — AB (ref 11.5–14.5)
WBC: 6.7 10*3/uL (ref 3.6–11.0)

## 2016-11-05 LAB — BASIC METABOLIC PANEL
Anion gap: 8 (ref 5–15)
BUN: 23 mg/dL — AB (ref 6–20)
CHLORIDE: 108 mmol/L (ref 101–111)
CO2: 25 mmol/L (ref 22–32)
Calcium: 9.2 mg/dL (ref 8.9–10.3)
Creatinine, Ser: 0.82 mg/dL (ref 0.44–1.00)
GFR calc Af Amer: >60 mL/min (ref >60)
GFR calc non Af Amer: >60 mL/min (ref >60)
Glucose, Bld: 86 mg/dL (ref 65–99)
POTASSIUM: 3.8 mmol/L (ref 3.5–5.1)
SODIUM: 141 mmol/L (ref 135–145)

## 2016-11-05 LAB — BRAIN NATRIURETIC PEPTIDE: B Natriuretic Peptide: 51 pg/mL (ref 0.0–100.0)

## 2016-11-05 MED ORDER — PREDNISONE 20 MG PO TABS
60.0000 mg | ORAL_TABLET | Freq: Once | ORAL | Status: AC
  Administered 2016-11-05: 60 mg via ORAL
  Filled 2016-11-05: qty 3

## 2016-11-05 MED ORDER — POLYMYXIN B-TRIMETHOPRIM 10000-0.1 UNIT/ML-% OP SOLN: 2.0000 [drp] | Freq: Four times a day (QID) | OPHTHALMIC | 0 refills | Status: AC

## 2016-11-05 MED ORDER — PREDNISONE 20 MG PO TABS: 20.0000 mg | ORAL_TABLET | Freq: Every day | ORAL | 0 refills | Status: AC

## 2016-11-05 MED ORDER — ALBUTEROL SULFATE HFA 108 (90 BASE) MCG/ACT IN AERS: 2.0000 | INHALATION_SPRAY | Freq: Four times a day (QID) | RESPIRATORY_TRACT | 2 refills | Status: DC | PRN

## 2016-11-05 MED ORDER — IPRATROPIUM-ALBUTEROL 0.5-2.5 (3) MG/3ML IN SOLN
RESPIRATORY_TRACT | Status: AC
  Administered 2016-11-05: 3 mL
  Filled 2016-11-05: qty 3

## 2016-11-05 MED ORDER — ALBUTEROL SULFATE (2.5 MG/3ML) 0.083% IN NEBU
INHALATION_SOLUTION | RESPIRATORY_TRACT | Status: AC
  Administered 2016-11-05: 2.5 mg
  Filled 2016-11-05: qty 3

## 2016-11-05 MED ORDER — IPRATROPIUM-ALBUTEROL 0.5-2.5 (3) MG/3ML IN SOLN
3.0000 mL | Freq: Once | RESPIRATORY_TRACT | Status: AC
  Administered 2016-11-05: 3 mL via RESPIRATORY_TRACT
  Filled 2016-11-05: qty 3

## 2016-11-05 MED ORDER — POLYMYXIN B-TRIMETHOPRIM 10000-0.1 UNIT/ML-% OP SOLN: 1.0000 [drp] | Freq: Four times a day (QID) | OPHTHALMIC | Status: DC

## 2016-11-05 MED ORDER — ALBUTEROL SULFATE (2.5 MG/3ML) 0.083% IN NEBU: 5.0000 mg | INHALATION_SOLUTION | Freq: Once | RESPIRATORY_TRACT | Status: DC

## 2016-11-05 MED ORDER — ALBUTEROL SULFATE (2.5 MG/3ML) 0.083% IN NEBU
5.0000 mg | INHALATION_SOLUTION | Freq: Once | RESPIRATORY_TRACT | Status: AC
  Administered 2016-11-05: 5 mg via RESPIRATORY_TRACT
  Filled 2016-11-05: qty 6

## 2016-11-05 NOTE — ED Notes (Addendum)
Pt states that she is breathing better. A/o. Denies pain. Breath sounds clear. Sats 98% RA. resp 18

## 2016-11-05 NOTE — Telephone Encounter (Signed)
Ms. Afifi called today inquiring about her prescriptions from [facility] and Medicare. I spoke with her about Medicare Part A, B and C and D. I let her know that today is the last day for the annual Medicare enrollment. It appears that she currently has Medicare A and B, with no prescription coverage. She states that she cannot afford prescription coverage.   Patient will be going to Social Services in the morning. I encouraged her to ask Social Services to screen her for LIS (Low Income Subsidy) and [employer] (Medicare Savings Plan). If she qualifies for LIS, this will provide financial assistance with the prescription coverage. If she qualifies for [facility], this will assist with the Part B premium and prescription coverage.   I explained that if she does qualify for either the LIS/MSP, that she could enroll for a Medicare Part D plan or Medicare Advantage plan at any time. I also explained that several of the Advantage plans has a zero monthly premium.  I also explained that if she does not qualify for LIS/MSP that she would not be able to enroll again until next year's enrollment and a penalty may be attached to the Part D premium.   If the patient is not able to get acute prescription assistance through Social Services for her 3 medications upon discharge from [facility], I encouraged her to bring them to [facility] to see if we can assist. Patient was told our hours of operation.   K. Joelene Millin, PharmD [facility]-Pharmacy Operations Coordinator [phone]

## 2016-11-05 NOTE — ED Notes (Signed)
Pt's avn popped apart due to O2 pressure and remaining medication spilled on bed. OK per Dr. Roxan Hockey to replace with one duoneb and one albuterol.

## 2016-11-05 NOTE — ED Provider Notes (Addendum)
[facility] Emergency Department Provider Note    None    (approximate)  I have reviewed the triage vital signs and the nursing notes.   HISTORY  Chief Complaint Shortness of Breath    HPI Katherine Price is a 70 y.o. female  history of half pack per day smoking presents with shortness of breath 2 weeks as well as crustations in her eyes when she wakes in the morning. Patient has had worsening wheezing. No productive cough. No fevers. No chest pain. No pressure. No nausea or vomiting.  Patient has been using her neighbor's albuterol inhaler with some improvement but has not been able to afford any inhalers that she was previously prescribed.   Past Medical History:  Diagnosis Date  . Anxiety   . Chronic back pain   . Coronary artery disease   . Depression   . Hyperlipidemia   . Hypertension   . Renal insufficiency    History reviewed. No pertinent family history. Past Surgical History:  Procedure Laterality Date  . CORONARY ARTERY BYPASS GRAFT  2003   There are no active problems to display for this patient.     Prior to Admission medications   Medication Sig Start Date End Date Taking? Authorizing Provider  albuterol (PROVENTIL HFA;VENTOLIN HFA) 108 (90 BASE) MCG/ACT inhaler Inhale 2 puffs into the lungs every 4 (four) hours as needed for wheezing or shortness of breath. 09/07/15   Anne-Caroline Sharma Covert, MD  albuterol (PROVENTIL HFA;VENTOLIN HFA) 108 (90 Base) MCG/ACT inhaler Inhale 2 puffs into the lungs every 6 (six) hours as needed for wheezing or shortness of breath. 11/05/16   Willy Eddy, MD  ALPRAZolam Prudy Feeler) 0.25 MG tablet Take 0.25 mg by mouth at bedtime as needed.      Historical Provider, MD  aspirin 81 MG tablet Take 81 mg by mouth daily.      Historical Provider, MD  atorvastatin (LIPITOR) 40 MG tablet Take 40 mg by mouth daily.      Historical Provider, MD  enalapril (VASOTEC) 20 MG tablet Take 20 mg by mouth daily.       Historical Provider, MD  escitalopram (LEXAPRO) 10 MG tablet Take 10 mg by mouth daily.      Historical Provider, MD  fluticasone (FLONASE) 50 MCG/ACT nasal spray Place 2 sprays into the nose daily.      Historical Provider, MD  hydrochlorothiazide (HYDRODIURIL) 25 MG tablet Take 1 tablet (25 mg total) by mouth daily. 09/07/15   Rockne Menghini, MD  hydrOXYzine (ATARAX/VISTARIL) 50 MG tablet Take 1 tablet (50 mg total) by mouth 3 (three) times daily as needed. 12/09/15   Joni Reining, PA-C  methylPREDNISolone (MEDROL DOSEPAK) 4 MG TBPK tablet Take Tapered dose as directed 12/09/15   Joni Reining, PA-C  metoprolol succinate (TOPROL XL) 25 MG 24 hr tablet Take 25 mg by mouth daily.      Historical Provider, MD  nitroGLYCERIN (NITROSTAT) 0.4 MG SL tablet Place 0.4 mg under the tongue every 5 (five) minutes as needed.      Historical Provider, MD  oxybutynin (DITROPAN) 5 MG tablet Take 5 mg by mouth 1 dose over 24 hours.      Historical Provider, MD  predniSONE (DELTASONE) 20 MG tablet Take 2 tablets (40 mg total) by mouth daily. 10/18/15   Minna Antis, MD  predniSONE (DELTASONE) 20 MG tablet Take 1 tablet (20 mg total) by mouth daily. 11/05/16 11/05/17  Willy Eddy, MD  trimethoprim-polymyxin b (POLYTRIM)  ophthalmic solution Place 2 drops into both eyes every 6 (six) hours. 11/05/16 11/12/16  Willy EddyPatrick , MD    Allergies Patient has no known allergies.    Social History Social History  Substance Use Topics  . Smoking status: Current Every Day Smoker    Packs/day: 1.00    Types: Cigarettes  . Smokeless tobacco: Never Used  . Alcohol use No    Review of Systems Patient denies headaches, rhinorrhea, blurry vision, numbness, shortness of breath, chest pain, edema, cough, abdominal pain, nausea, vomiting, diarrhea, dysuria, fevers, rashes or hallucinations unless otherwise stated above in HPI. ____________________________________________   PHYSICAL EXAM:  VITAL  SIGNS: Vitals:   11/05/16 1200 11/05/16 1215  BP:    Pulse: 75 71  Resp: (!) 23 15    Constitutional: Alert and oriented. Well appearing and in no acute distress. Eyes: Conjunctivae with mild erythema bilaterally. PERRL. EOMI. Head: Atraumatic. Nose: No congestion/rhinnorhea. Mouth/Throat: Mucous membranes are moist.  Oropharynx non-erythematous. Neck: No stridor. Painless ROM. No cervical spine tenderness to palpation Hematological/Lymphatic/Immunilogical: No cervical lymphadenopathy. Cardiovascular: Normal rate, regular rhythm. Grossly normal heart sounds.  Good peripheral circulation. Respiratory: prolonged expiratory phase, diffuse wheezing Gastrointestinal: Soft and nontender. No distention. No abdominal bruits. No CVA tenderness. Genitourinary:  Musculoskeletal: No lower extremity tenderness nor edema.  No joint effusions. Neurologic:  Normal speech and language. No gross focal neurologic deficits are appreciated. No gait instability. Skin:  Skin is warm, dry and intact. No rash noted. Psychiatric: Mood and affect are normal. Speech and behavior are normal.  ____________________________________________   LABS (all labs ordered are listed, but only abnormal results are displayed)  Results for orders placed or performed during the hospital encounter of 11/05/16 (from the past 24 hour(s))  Basic metabolic panel     Status: Abnormal   Collection Time: 11/05/16 10:38 AM  Result Value Ref Range   Sodium 141 135 - 145 mmol/L   Potassium 3.8 3.5 - 5.1 mmol/L   Chloride 108 101 - 111 mmol/L   CO2 25 22 - 32 mmol/L   Glucose, Bld 86 65 - 99 mg/dL   BUN 23 (H) 6 - 20 mg/dL   Creatinine, Ser 1.610.82 0.44 - 1.00 mg/dL   Calcium 9.2 8.9 - 09.610.3 mg/dL   GFR calc non Af Amer >60 >60 mL/min   GFR calc Af Amer >60 >60 mL/min   Anion gap 8 5 - 15  CBC     Status: Abnormal   Collection Time: 11/05/16 10:38 AM  Result Value Ref Range   WBC 6.7 3.6 - 11.0 K/uL   RBC 4.61 3.80 - 5.20  MIL/uL   Hemoglobin 14.1 12.0 - 16.0 g/dL   HCT 04.540.9 40.935.0 - 81.147.0 %   MCV 88.7 80.0 - 100.0 fL   MCH 30.5 26.0 - 34.0 pg   MCHC 34.4 32.0 - 36.0 g/dL   RDW 91.414.8 (H) 78.211.5 - 95.614.5 %   Platelets 328 150 - 440 K/uL  Troponin I     Status: None   Collection Time: 11/05/16 10:38 AM  Result Value Ref Range   Troponin I <0.03 <0.03 ng/mL  Brain natriuretic peptide     Status: None   Collection Time: 11/05/16 10:38 AM  Result Value Ref Range   B Natriuretic Peptide 51.0 0.0 - 100.0 pg/mL   ____________________________________________  EKG My review and personal interpretation at Time: 10:05   Indication: sob  Rate: 70  Rhythm: sinus Axis: normal Other: non specific st changes,normal intervals, no  STEMI ____________________________________________  RADIOLOGY  I personally reviewed all radiographic images ordered to evaluate for the above acute complaints and reviewed radiology reports and findings.  These findings were personally discussed with the patient.  Please see medical record for radiology report.  ____________________________________________   PROCEDURES  Procedure(s) performed: none Procedures    Critical Care performed: no ____________________________________________   INITIAL IMPRESSION / ASSESSMENT AND PLAN / ED COURSE  Pertinent labs & imaging results that were available during my care of the patient were reviewed by me and considered in my medical decision making (see chart for details).  DDX: Asthma, copd, CHF, pna, ptx, malignancy, Pe, anemia   MAUD RUBENDALL is a 70 y.o. who presents to the ED with chief complaint of 2 weeks of progressive worsening shortness of breath and wheezing. Patient is afebrile and hemodynamically stable. Exam is more consistent with COPD exacerbation possible bronchitis or pneumonia. Do not appreciate any exam findings to be consistent with congestive heart failure but will order chest x-ray to evaluate for any component of pneumonia  or edema. EKG ordered in triage shows nonspecific ST changes. Patient denies any chest pain or pressure in the symptoms have been ongoing for 2 weeks of phlebolith suspicion for ACS. We'll check blood work as well as give breathing treatments and oral steroids.  Clinical Course as of Nov 05 1312  Thu Nov 05, 2016  1239 Troponin negative. Not consistent with CHF.  Significant improvement after nebs.  Patient is able to ambulate with a steady gait and without desaturation ORA. We'll provide patient with upper prescription for steroids as well as breathing treatments and ophthalmic antibiotic drops.  Have discussed with the patient and available family all diagnostics and treatments performed thus far and all questions were answered to the best of my ability. The patient demonstrates understanding and agreement with plan.   [PR]    Clinical Course User Index [PR] Willy Eddy, MD     ____________________________________________   FINAL CLINICAL IMPRESSION(S) / ED DIAGNOSES  Final diagnoses:  COPD exacerbation (HCC)  Shortness of breath  Acute conjunctivitis of left eye, unspecified acute conjunctivitis type      NEW MEDICATIONS STARTED DURING THIS VISIT:  Discharge Medication List as of 11/05/2016 12:40 PM    START taking these medications   Details  !! albuterol (PROVENTIL HFA;VENTOLIN HFA) 108 (90 Base) MCG/ACT inhaler Inhale 2 puffs into the lungs every 6 (six) hours as needed for wheezing or shortness of breath., Starting Thu 11/05/2016, Print    !! predniSONE (DELTASONE) 20 MG tablet Take 1 tablet (20 mg total) by mouth daily., Starting Thu 11/05/2016, Until Fri 11/05/2017, Print    trimethoprim-polymyxin b (POLYTRIM) ophthalmic solution Place 2 drops into both eyes every 6 (six) hours., Starting Thu 11/05/2016, Until Thu 11/12/2016, Print     !! - Potential duplicate medications found. Please discuss with provider.       Note:  This document was prepared using Dragon voice  recognition software and may include unintentional dictation errors.    Willy Eddy, MD 11/05/16 1313    Willy Eddy, MD 11/05/16 [phone]

## 2016-11-05 NOTE — ED Triage Notes (Signed)
Pt from home with sob x 2 weeks; states it continues to worsen. Pt states she has been prescribed htn medication but does not take it because of the cost> Pt O2 sat 97% on room air, has some difficulty with long sentences. Wheezing bilaterally, most prominent in base of left lower lobe. Pt alert & oriented; nad noted.   Pt c/o headache x 2 weeks intermittently.

## 2018-02-26 ENCOUNTER — Other Ambulatory Visit: Payer: Self-pay

## 2018-02-26 ENCOUNTER — Emergency Department: Payer: Medicare Other

## 2018-02-26 ENCOUNTER — Encounter: Payer: Self-pay | Admitting: Emergency Medicine

## 2018-02-26 ENCOUNTER — Emergency Department
Admission: EM | Admit: 2018-02-26 | Discharge: 2018-02-26 | Disposition: A | Discharge status: Home / Self Care | Payer: Medicare Other | Attending: Emergency Medicine | Admitting: Emergency Medicine

## 2018-02-26 DIAGNOSIS — J441 Chronic obstructive pulmonary disease with (acute) exacerbation: Secondary | ICD-10-CM | POA: Diagnosis not present

## 2018-02-26 DIAGNOSIS — Z79899 Other long term (current) drug therapy: Secondary | ICD-10-CM | POA: Insufficient documentation

## 2018-02-26 DIAGNOSIS — Z951 Presence of aortocoronary bypass graft: Secondary | ICD-10-CM | POA: Insufficient documentation

## 2018-02-26 DIAGNOSIS — I10 Essential (primary) hypertension: Secondary | ICD-10-CM | POA: Diagnosis not present

## 2018-02-26 DIAGNOSIS — R062 Wheezing: Secondary | ICD-10-CM | POA: Diagnosis present

## 2018-02-26 DIAGNOSIS — Z7982 Long term (current) use of aspirin: Secondary | ICD-10-CM | POA: Diagnosis not present

## 2018-02-26 DIAGNOSIS — F17210 Nicotine dependence, cigarettes, uncomplicated: Secondary | ICD-10-CM | POA: Diagnosis not present

## 2018-02-26 DIAGNOSIS — E785 Hyperlipidemia, unspecified: Secondary | ICD-10-CM | POA: Insufficient documentation

## 2018-02-26 DIAGNOSIS — I2510 Atherosclerotic heart disease of native coronary artery without angina pectoris: Secondary | ICD-10-CM | POA: Diagnosis not present

## 2018-02-26 HISTORY — DX: Chronic obstructive pulmonary disease, unspecified: J44.9

## 2018-02-26 LAB — COMPREHENSIVE METABOLIC PANEL
ALK PHOS: 83 U/L (ref 38–126)
ALT: 11 U/L — AB (ref 14–54)
ANION GAP: 11 (ref 5–15)
AST: 27 U/L (ref 15–41)
Albumin: 4.3 g/dL (ref 3.5–5.0)
BILIRUBIN TOTAL: 0.7 mg/dL (ref 0.3–1.2)
BUN: 20 mg/dL (ref 6–20)
CALCIUM: 9.3 mg/dL (ref 8.9–10.3)
CO2: 22 mmol/L (ref 22–32)
Chloride: 106 mmol/L (ref 101–111)
Creatinine, Ser: 1.15 mg/dL — ABNORMAL HIGH (ref 0.44–1.00)
GFR calc non Af Amer: 46 mL/min — ABNORMAL LOW (ref >60)
GFR, EST AFRICAN AMERICAN: 53 mL/min — AB (ref >60)
GLUCOSE: 90 mg/dL (ref 65–99)
Potassium: 3.9 mmol/L (ref 3.5–5.1)
Sodium: 139 mmol/L (ref 135–145)
TOTAL PROTEIN: 7.5 g/dL (ref 6.5–8.1)

## 2018-02-26 LAB — CBC
HEMATOCRIT: 40.8 % (ref 35.0–47.0)
HEMOGLOBIN: 13.7 g/dL (ref 12.0–16.0)
MCH: 30 pg (ref 26.0–34.0)
MCHC: 33.6 g/dL (ref 32.0–36.0)
MCV: 89.5 fL (ref 80.0–100.0)
Platelets: 307 10*3/uL (ref 150–440)
RBC: 4.55 MIL/uL (ref 3.80–5.20)
RDW: 14.7 % — ABNORMAL HIGH (ref 11.5–14.5)
WBC: 8.7 10*3/uL (ref 3.6–11.0)

## 2018-02-26 LAB — TROPONIN I: Troponin I: <0.03 ng/mL (ref <0.03)

## 2018-02-26 MED ORDER — ALBUTEROL SULFATE HFA 108 (90 BASE) MCG/ACT IN AERS: 2.0000 | INHALATION_SPRAY | Freq: Four times a day (QID) | RESPIRATORY_TRACT | 2 refills | Status: DC | PRN

## 2018-02-26 MED ORDER — PREDNISONE 20 MG PO TABS: 60.0000 mg | ORAL_TABLET | Freq: Every day | ORAL | 0 refills | Status: AC

## 2018-02-26 MED ORDER — IPRATROPIUM-ALBUTEROL 0.5-2.5 (3) MG/3ML IN SOLN
3.0000 mL | Freq: Once | RESPIRATORY_TRACT | Status: AC
  Administered 2018-02-26: 3 mL via RESPIRATORY_TRACT
  Filled 2018-02-26: qty 3

## 2018-02-26 MED ORDER — PREDNISONE 20 MG PO TABS
60.0000 mg | ORAL_TABLET | Freq: Once | ORAL | Status: AC
  Administered 2018-02-26: 60 mg via ORAL
  Filled 2018-02-26: qty 3

## 2018-02-26 MED ORDER — HYDROCHLOROTHIAZIDE 25 MG PO TABS: 25.0000 mg | ORAL_TABLET | Freq: Every day | ORAL | 2 refills | Status: DC

## 2018-02-26 MED ORDER — LABETALOL HCL 5 MG/ML IV SOLN
10.0000 mg | Freq: Once | INTRAVENOUS | Status: AC
  Administered 2018-02-26: 10 mg via INTRAVENOUS
  Filled 2018-02-26: qty 4

## 2018-02-26 NOTE — ED Notes (Signed)
ED Provider at bedside. 

## 2018-02-26 NOTE — ED Provider Notes (Signed)
EKG is reviewed and her by me at 10:47 AM Heart rate 80 QRS 89 QTC 430 Normal sinus rhythm, frequent PVCs, there are multiple T wave abnormalities denoted, as compared with her previous however many of them appear to be pre-existing compared with November 05, 2016.  However, I question if the T wave inversions in lead II and lead III are new compared with previous.  EKG reviewed, no STEMI, but is concerning for possible new T wave abnormality.  Cannot rule out ischemia based on this twelve-lead.  Discussed with nurse, patient will be transferred to the A or B side of the ER for physician evaluation   Sharyn Creamer, MD 02/26/18 1053

## 2018-02-26 NOTE — Discharge Instructions (Addendum)
Take the prednisone as prescribed starting tomorrow for the full 4 days.  You may use your inhaler up to every 4-6 hours for the next several days.  We have also prescribed blood pressure medication.  Return to the emergency department for new, worsening, persistent shortness of breath, weakness or lightheadedness, chest pain, or any other new or worsening symptoms that concern you.

## 2018-02-26 NOTE — ED Provider Notes (Signed)
[facility] Emergency Department Provider Note ____________________________________________   First MD Initiated Contact with Patient 02/26/18 1504     (approximate)  I have reviewed the triage vital signs and the nursing notes.   HISTORY  Chief Complaint Wheezing    HPI Katherine Price is a 72 y.o. female with past medical history as noted below including COPD and CAD who presents with shortness of breath over the last several days, gradual onset, associated with wheezing and nonproductive cough, somewhat improved with her in inhaler initially but not in the last few days.  She states it feels like her COPD/asthma.  She also reports some sharp chest pain.  She denies fever, vomiting or diarrhea, abdominal pain, or back pain.   Past Medical History:  Diagnosis Date  . Anxiety   . Chronic back pain   . COPD (chronic obstructive pulmonary disease) (HCC)   . Coronary artery disease   . Depression   . Hyperlipidemia   . Hypertension   . Renal insufficiency     There are no active problems to display for this patient.   Past Surgical History:  Procedure Laterality Date  . CORONARY ARTERY BYPASS GRAFT  2003    Prior to Admission medications   Medication Sig Start Date End Date Taking? Authorizing Provider  albuterol (PROVENTIL HFA;VENTOLIN HFA) 108 (90 Base) MCG/ACT inhaler Inhale 2 puffs into the lungs every 6 (six) hours as needed for wheezing or shortness of breath. 02/26/18   Dionne Bucy, MD  ALPRAZolam Prudy Feeler) 0.25 MG tablet Take 0.25 mg by mouth at bedtime as needed.      [provider]  aspirin 81 MG tablet Take 81 mg by mouth daily.      [provider]  atorvastatin (LIPITOR) 40 MG tablet Take 40 mg by mouth daily.      [provider]  enalapril (VASOTEC) 20 MG tablet Take 20 mg by mouth daily.      [provider]  escitalopram (LEXAPRO) 10 MG tablet Take 10 mg by mouth daily.      [provider]  fluticasone (FLONASE) 50 MCG/ACT nasal spray Place 2 sprays into the nose daily.      [provider]  hydrochlorothiazide (HYDRODIURIL) 25 MG tablet Take 1 tablet (25 mg total) by mouth daily. 02/26/18 05/27/18  Dionne Bucy, MD  hydrOXYzine (ATARAX/VISTARIL) 50 MG tablet Take 1 tablet (50 mg total) by mouth 3 (three) times daily as needed. 12/09/15   Joni Reining, PA-C  methylPREDNISolone (MEDROL DOSEPAK) 4 MG TBPK tablet Take Tapered dose as directed 12/09/15   Joni Reining, PA-C  metoprolol succinate (TOPROL XL) 25 MG 24 hr tablet Take 25 mg by mouth daily.      [provider]  nitroGLYCERIN (NITROSTAT) 0.4 MG SL tablet Place 0.4 mg under the tongue every 5 (five) minutes as needed.      [provider]  oxybutynin (DITROPAN) 5 MG tablet Take 5 mg by mouth 1 dose over 24 hours.      [provider]  predniSONE (DELTASONE) 20 MG tablet Take 3 tablets (60 mg total) by mouth daily for 4 days. 02/27/18 03/03/18  Dionne Bucy, MD    Allergies Patient has no known allergies.  No family history on file.  Social History Social History   Tobacco Use  . Smoking status: Current Every Day Smoker    Packs/day: 1.00    Types: Cigarettes  . Smokeless tobacco: Never Used  Substance Use Topics  . Alcohol use: No  . Drug use: No    Review of Systems  Constitutional: No fever. Eyes: No redness. ENT: No sore throat. Cardiovascular: Positive for chest pain. Respiratory: Positive for shortness of breath. Gastrointestinal: No nausea, no vomiting.  No diarrhea.  Genitourinary: Negative for dysuria.  Musculoskeletal: Negative for back pain. Skin: Negative for rash. Neurological: Negative for headache.   ____________________________________________   PHYSICAL EXAM:  VITAL SIGNS: ED Triage Vitals  Enc Vitals Group     BP 02/26/18 0952 (!) 203/105     Pulse Rate 02/26/18 0952 (!) 109     Resp 02/26/18 0952 20     Temp  02/26/18 0952 98.2 F (36.8 C)     Temp src --      SpO2 02/26/18 0952 98 %     Weight 02/26/18 0949 82 lb (37.2 kg)     Height 02/26/18 0949 5' 4.5" (1.638 m)     Head Circumference --      Peak Flow --      Pain Score 02/26/18 0949 0     Pain Loc --      Pain Edu? --      Excl. in GC? --     Constitutional: Alert and oriented. Well appearing and in no acute distress. Eyes: Conjunctivae are normal.  Head: Atraumatic. Nose: No congestion/rhinnorhea. Mouth/Throat: Mucous membranes are moist.   Neck: Normal range of motion.  Cardiovascular: Normal rate, regular rhythm. Grossly normal heart sounds.  Good peripheral circulation. Respiratory: Normal respiratory effort.  No retractions.  Trace wheezing with prolonged expiratory phase.  Good air entry.  No respiratory distress. Gastrointestinal: No distention.  Musculoskeletal: No lower extremity edema.  Extremities warm and well perfused.  Neurologic:  Normal speech and language. No gross focal neurologic deficits are appreciated.  Skin:  Skin is warm and dry. No rash noted. Psychiatric: Mood and affect are normal. Speech and behavior are normal.  ____________________________________________   LABS (all labs ordered are listed, but only abnormal results are displayed)  Labs Reviewed  CBC - Abnormal; Notable for the following components:      Result Value   RDW 14.7 (*)    All other components within normal limits  COMPREHENSIVE METABOLIC PANEL - Abnormal; Notable for the following components:   Creatinine, Ser 1.15 (*)    ALT 11 (*)    GFR calc non Af Amer 46 (*)    GFR calc Af Amer 53 (*)    All other components within normal limits  TROPONIN I  TROPONIN I   ____________________________________________  EKG  ED ECG REPORT I, Dionne Bucy, the attending physician, personally viewed and interpreted this ECG.  Date: 02/26/2018 EKG Time: 1046 Rate: 80 Rhythm: normal sinus rhythm with PVCs  QRS Axis:  normal Intervals: normal ST/T Wave abnormalities: Inferior and lateral T wave inversions Narrative Interpretation: Nonspecific changes; new inferior T wave inversions when compared to EKG from 11/05/2016  ____________________________________________  RADIOLOGY  CXR: No focal infiltrate  ____________________________________________   PROCEDURES  Procedure(s) performed: No  Procedures  Critical Care performed: No ____________________________________________   INITIAL IMPRESSION / ASSESSMENT AND PLAN / ED COURSE  Pertinent labs & imaging results that were available during my care of the patient were reviewed by me and considered in my medical decision making (see chart for details).  72 year old female with history of COPD presents with shortness of breath, wheezing, and some cough over the last week not relieved by her inhaler  at home but feeling similar to prior COPD exacerbations.  She also reports some chest pain.  Past medical records reviewed in epic and are noncontributory; patient was last seen in the ED in late 2017.  On exam, the patient is extremely well-appearing.  She is significantly hypertensive, although she reports that this is chronic as she is noncompliant with her blood pressure medication due to not being able to afford it.  The remainder of her exam is as described above, with some trace wheezing (after nebs were already given) but no respiratory distress.  Of note, the patient's MD evaluation was delayed due to a clerical error.  The patient was placed in the room with a  physician assigned on Epic that was not the patient's actual physician.  I was alerted to the presence of the patient and the fact that she had not yet been evaluated by an MD at 3 PM and immediately went to go see her.  Overall presentation is consistent with mild COPD exacerbation versus viral bronchitis.  Chest x-ray shows no focal infiltrate.  I have a low suspicion for ACS or other acute  cardiac etiology.  Given patient's CAD history and nonspecific EKG findings we will obtain a repeat troponin.  Nebs and steroid have already been given.  Anticipate discharge home if repeat troponin is negative.    ----------------------------------------- 4:51 PM on 02/26/2018 -----------------------------------------  Repeat troponin is negative.  Patient's blood pressure is improved.  She feels well to go home.  Return precautions given, and she expresses understanding.  She states that she may be able to get the blood pressure medication with help from social services so I prescribed her for hydrochlorothiazide which she has been prescribed previously.  ____________________________________________   FINAL CLINICAL IMPRESSION(S) / ED DIAGNOSES  Final diagnoses:  COPD exacerbation (HCC)      NEW MEDICATIONS STARTED DURING THIS VISIT:  New Prescriptions   ALBUTEROL (PROVENTIL HFA;VENTOLIN HFA) 108 (90 BASE) MCG/ACT INHALER    Inhale 2 puffs into the lungs every 6 (six) hours as needed for wheezing or shortness of breath.   HYDROCHLOROTHIAZIDE (HYDRODIURIL) 25 MG TABLET    Take 1 tablet (25 mg total) by mouth daily.   PREDNISONE (DELTASONE) 20 MG TABLET    Take 3 tablets (60 mg total) by mouth daily for 4 days.     Note:  This document was prepared using Dragon voice recognition software and may include unintentional dictation errors.   Dionne Bucy, MD 02/26/18 [id number]

## 2018-02-26 NOTE — ED Triage Notes (Signed)
C/O cough and wheezing for several weeks.  States she ran out of her inhaler, but did get another one -- its not working.   AAOx3/  Skin warm and dry. NAD

## 2018-02-26 NOTE — ED Notes (Signed)
Patient states she has history of high blood pressure, but is not taking her medication at this time due to financial struggles.

## 2018-02-26 NOTE — ED Provider Notes (Signed)
[facility] Emergency Department Provider Note  ____________________________________________  Time seen: Approximately 11:03 AM  I have reviewed the triage vital signs and the nursing notes.   HISTORY  Chief Complaint Wheezing    HPI Katherine Price is a 72 y.o. female that presents to the emergency department for evaluation of worsening non productive cough and wheezing for 3 days.  Patient  states that this feels similar to COPD/asthma.  Inhalers have not helped her symptoms lately.   Past Medical History:  Diagnosis Date  . Anxiety   . Chronic back pain   . COPD (chronic obstructive pulmonary disease) (HCC)   . Coronary artery disease   . Depression   . Hyperlipidemia   . Hypertension   . Renal insufficiency     There are no active problems to display for this patient.   Past Surgical History:  Procedure Laterality Date  . CORONARY ARTERY BYPASS GRAFT  2003    Prior to Admission medications   Medication Sig Start Date End Date Taking? Authorizing Provider  albuterol (PROVENTIL HFA;VENTOLIN HFA) 108 (90 BASE) MCG/ACT inhaler Inhale 2 puffs into the lungs every 4 (four) hours as needed for wheezing or shortness of breath. 09/07/15   Rockne Menghini, MD  albuterol (PROVENTIL HFA;VENTOLIN HFA) 108 (90 Base) MCG/ACT inhaler Inhale 2 puffs into the lungs every 6 (six) hours as needed for wheezing or shortness of breath. 11/05/16   Willy Eddy, MD  ALPRAZolam Prudy Feeler) 0.25 MG tablet Take 0.25 mg by mouth at bedtime as needed.      [provider]  aspirin 81 MG tablet Take 81 mg by mouth daily.      [provider]  atorvastatin (LIPITOR) 40 MG tablet Take 40 mg by mouth daily.      [provider]  enalapril (VASOTEC) 20 MG tablet Take 20 mg by mouth daily.      [provider]  escitalopram (LEXAPRO) 10 MG tablet Take 10 mg by mouth daily.      [provider]  fluticasone (FLONASE) 50 MCG/ACT  nasal spray Place 2 sprays into the nose daily.      [provider]  hydrochlorothiazide (HYDRODIURIL) 25 MG tablet Take 1 tablet (25 mg total) by mouth daily. 09/07/15   Rockne Menghini, MD  hydrOXYzine (ATARAX/VISTARIL) 50 MG tablet Take 1 tablet (50 mg total) by mouth 3 (three) times daily as needed. 12/09/15   Joni Reining, PA-C  methylPREDNISolone (MEDROL DOSEPAK) 4 MG TBPK tablet Take Tapered dose as directed 12/09/15   Joni Reining, PA-C  metoprolol succinate (TOPROL XL) 25 MG 24 hr tablet Take 25 mg by mouth daily.      [provider]  nitroGLYCERIN (NITROSTAT) 0.4 MG SL tablet Place 0.4 mg under the tongue every 5 (five) minutes as needed.      [provider]  oxybutynin (DITROPAN) 5 MG tablet Take 5 mg by mouth 1 dose over 24 hours.      [provider]  predniSONE (DELTASONE) 20 MG tablet Take 2 tablets (40 mg total) by mouth daily. 10/18/15   Minna Antis, MD    Allergies Patient has no known allergies.  No family history on file.  Social History Social History   Tobacco Use  . Smoking status: Current Every Day Smoker    Packs/day: 1.00    Types: Cigarettes  . Smokeless tobacco: Never Used  Substance Use Topics  . Alcohol use: No  . Drug use: No  Review of Systems  Respiratory: Positive for cough and shortness of breath. Gastrointestinal: No abdominal pain.  No nausea, no vomiting.  Musculoskeletal: Negative for musculoskeletal pain. Skin: Negative for rash, abrasions, lacerations, ecchymosis. Neurological: Negative for headaches   ____________________________________________   PHYSICAL EXAM:  VITAL SIGNS: ED Triage Vitals  Enc Vitals Group     BP 02/26/18 0952 (!) 203/105     Pulse Rate 02/26/18 0952 (!) 109     Resp 02/26/18 0952 20     Temp 02/26/18 0952 98.2 F (36.8 C)     Temp src --      SpO2 02/26/18 0952 98 %     Weight 02/26/18 0949 82 lb (37.2 kg)     Height 02/26/18 0949 5' 4.5" (1.638  m)     Head Circumference --      Peak Flow --      Pain Score 02/26/18 0949 0     Pain Loc --      Pain Edu? --      Excl. in GC? --      Constitutional: Alert and oriented.  Eyes: Conjunctivae are normal. PERRL. EOMI. Head: Atraumatic. ENT:      Ears:      Nose: No congestion/rhinnorhea.      Mouth/Throat: Mucous membranes are moist.  Neck: No stridor.   Cardiovascular: Normal rate, regular rhythm.  Good peripheral circulation. Respiratory: Audible wheezing.  Normal respiratory effort without tachypnea or retractions.  Scattered wheezes. Good air entry to the bases with no decreased or absent breath sounds. Musculoskeletal: Full range of motion to all extremities. No gross deformities appreciated. Neurologic:  Normal speech and language. No gross focal neurologic deficits are appreciated.  Skin:  Skin is warm, dry and intact. No rash noted.   ____________________________________________   LABS (all labs ordered are listed, but only abnormal results are displayed)  Labs Reviewed  CBC  COMPREHENSIVE METABOLIC PANEL  TROPONIN I   ____________________________________________  EKG   ____________________________________________  RADIOLOGY   No results found.  ____________________________________________    PROCEDURES  Procedure(s) performed:    Procedures    Medications  ipratropium-albuterol (DUONEB) 0.5-2.5 (3) MG/3ML nebulizer solution 3 mL (3 mLs Nebulization Given 02/26/18 1041)  predniSONE (DELTASONE) tablet 60 mg (60 mg Oral Given 02/26/18 1041)     ____________________________________________   INITIAL IMPRESSION / ASSESSMENT AND PLAN / ED COURSE  Pertinent labs & imaging results that were available during my care of the patient were reviewed by me and considered in my medical decision making (see chart for details).  Review of the [place] CSRS was performed in accordance of the [facility] prior to dispensing any controlled drugs.   Patient  presented to the emergency department for evaluation of worsening wheezing and cough for 3 days.  Upon entering the room, patient had audible wheezing.  Labs, chest x-ray, EKG, DuoNeb, steroids were ordered.  EKG showed changes per MD so patient was transferred to main for further evaluation.  Report was given to Dr. Mayford Knife.  ____________________________________________  FINAL CLINICAL IMPRESSION(S) / ED DIAGNOSES  Final diagnoses:  None      NEW MEDICATIONS STARTED DURING THIS VISIT:  ED Discharge Orders    None          This chart was dictated using voice recognition software/Dragon. Despite best efforts to proofread, errors can occur which can change the meaning. Any change was purely unintentional.    Enid Derry, PA-C 02/26/18 1613    Sharyn Creamer, MD 02/27/18  1403  

## 2018-04-14 ENCOUNTER — Emergency Department: Payer: Medicare Other

## 2018-04-14 ENCOUNTER — Emergency Department
Admission: EM | Admit: 2018-04-14 | Discharge: 2018-04-14 | Disposition: A | Discharge status: Home / Self Care | Payer: Medicare Other | Attending: Emergency Medicine | Admitting: Emergency Medicine

## 2018-04-14 ENCOUNTER — Encounter: Payer: Self-pay | Admitting: Emergency Medicine

## 2018-04-14 ENCOUNTER — Other Ambulatory Visit: Payer: Self-pay

## 2018-04-14 DIAGNOSIS — I2510 Atherosclerotic heart disease of native coronary artery without angina pectoris: Secondary | ICD-10-CM | POA: Insufficient documentation

## 2018-04-14 DIAGNOSIS — F17210 Nicotine dependence, cigarettes, uncomplicated: Secondary | ICD-10-CM | POA: Insufficient documentation

## 2018-04-14 DIAGNOSIS — J069 Acute upper respiratory infection, unspecified: Secondary | ICD-10-CM

## 2018-04-14 DIAGNOSIS — J441 Chronic obstructive pulmonary disease with (acute) exacerbation: Secondary | ICD-10-CM

## 2018-04-14 DIAGNOSIS — Z7982 Long term (current) use of aspirin: Secondary | ICD-10-CM | POA: Diagnosis not present

## 2018-04-14 DIAGNOSIS — F329 Major depressive disorder, single episode, unspecified: Secondary | ICD-10-CM | POA: Insufficient documentation

## 2018-04-14 DIAGNOSIS — Z951 Presence of aortocoronary bypass graft: Secondary | ICD-10-CM | POA: Insufficient documentation

## 2018-04-14 DIAGNOSIS — R05 Cough: Secondary | ICD-10-CM | POA: Diagnosis present

## 2018-04-14 DIAGNOSIS — F419 Anxiety disorder, unspecified: Secondary | ICD-10-CM | POA: Insufficient documentation

## 2018-04-14 DIAGNOSIS — I10 Essential (primary) hypertension: Secondary | ICD-10-CM | POA: Insufficient documentation

## 2018-04-14 DIAGNOSIS — Z79899 Other long term (current) drug therapy: Secondary | ICD-10-CM | POA: Insufficient documentation

## 2018-04-14 LAB — COMPREHENSIVE METABOLIC PANEL
ALBUMIN: 4.5 g/dL (ref 3.5–5.0)
ALT: 11 U/L — ABNORMAL LOW (ref 14–54)
ANION GAP: 10 (ref 5–15)
AST: 24 U/L (ref 15–41)
Alkaline Phosphatase: 79 U/L (ref 38–126)
BUN: 17 mg/dL (ref 6–20)
CHLORIDE: 106 mmol/L (ref 101–111)
CO2: 22 mmol/L (ref 22–32)
Calcium: 9.2 mg/dL (ref 8.9–10.3)
Creatinine, Ser: 0.89 mg/dL (ref 0.44–1.00)
GFR calc Af Amer: >60 mL/min (ref >60)
Glucose, Bld: 93 mg/dL (ref 65–99)
POTASSIUM: 3.8 mmol/L (ref 3.5–5.1)
Sodium: 138 mmol/L (ref 135–145)
Total Bilirubin: 0.8 mg/dL (ref 0.3–1.2)
Total Protein: 7.7 g/dL (ref 6.5–8.1)

## 2018-04-14 LAB — CBC
HCT: 41.6 % (ref 35.0–47.0)
Hemoglobin: 14 g/dL (ref 12.0–16.0)
MCH: 30.3 pg (ref 26.0–34.0)
MCHC: 33.7 g/dL (ref 32.0–36.0)
MCV: 89.9 fL (ref 80.0–100.0)
PLATELETS: 242 10*3/uL (ref 150–440)
RBC: 4.62 MIL/uL (ref 3.80–5.20)
RDW: 15.2 % — AB (ref 11.5–14.5)
WBC: 7.2 10*3/uL (ref 3.6–11.0)

## 2018-04-14 MED ORDER — AZITHROMYCIN 250 MG PO TABS: ORAL_TABLET | ORAL | 0 refills | Status: AC

## 2018-04-14 MED ORDER — PREDNISONE 50 MG PO TABS: 50.0000 mg | ORAL_TABLET | Freq: Every day | ORAL | 0 refills | Status: DC

## 2018-04-14 MED ORDER — ALBUTEROL SULFATE (2.5 MG/3ML) 0.083% IN NEBU
5.0000 mg | INHALATION_SOLUTION | Freq: Once | RESPIRATORY_TRACT | Status: DC
  Filled 2018-04-14: qty 6

## 2018-04-14 NOTE — ED Triage Notes (Signed)
Pt reports sinus pressure and pain, cough and congestion and CP when she coughs. Pt reports is not able to cough anything up.

## 2018-04-14 NOTE — ED Notes (Signed)
Pt is being discharged to home. Pt is AOX4, VSS, and she does not show any signs of respiratory distress. AVS was given and explained to pt and she verbalized understanding of the AVS and discharge plan of care.

## 2018-04-14 NOTE — ED Notes (Signed)
ED Provider at bedside. 

## 2018-04-14 NOTE — ED Notes (Signed)
First Nurse Note:  Patient to Room 19 via WC.  Tresa Endo RN aware of room placement.  Given a gown to put on, bed in low position.  Patient states she feels better than she did.

## 2018-04-14 NOTE — ED Provider Notes (Signed)
[facility] Emergency Department Provider Note   ____________________________________________    I have reviewed the triage vital signs and the nursing notes.   HISTORY  Chief Complaint Cough; Chest Pain; and Facial Pain     HPI Katherine Price is a 72 y.o. female with a history of COPD who presents with complaints of cough, mild shortness of breath, chest tightness consistent with her usual COPD exacerbation.  She has taken inhalers at home with little improvement.  She denies fevers or chills.  No recent travel.  No calf pain or swelling.  No nausea or vomiting.  No diaphoresis.   Past Medical History:  Diagnosis Date  . Anxiety   . Chronic back pain   . COPD (chronic obstructive pulmonary disease) (HCC)   . Coronary artery disease   . Depression   . Hyperlipidemia   . Hypertension   . Renal insufficiency     There are no active problems to display for this patient.   Past Surgical History:  Procedure Laterality Date  . CORONARY ARTERY BYPASS GRAFT  2003    Prior to Admission medications   Medication Sig Start Date End Date Taking? Authorizing Provider  albuterol (PROVENTIL HFA;VENTOLIN HFA) 108 (90 Base) MCG/ACT inhaler Inhale 2 puffs into the lungs every 6 (six) hours as needed for wheezing or shortness of breath. 02/26/18   Dionne Bucy, MD  ALPRAZolam Prudy Feeler) 0.25 MG tablet Take 0.25 mg by mouth at bedtime as needed.      [provider]  aspirin 81 MG tablet Take 81 mg by mouth daily.      [provider]  atorvastatin (LIPITOR) 40 MG tablet Take 40 mg by mouth daily.      [provider]  azithromycin (ZITHROMAX Z-PAK) 250 MG tablet Take 2 tablets (500 mg) on  Day 1,  followed by 1 tablet (250 mg) once daily on Days 2 through 5. 04/14/18 04/19/18  Jene Every, MD  enalapril (VASOTEC) 20 MG tablet Take 20 mg by mouth daily.      [provider]  escitalopram (LEXAPRO) 10 MG tablet Take 10  mg by mouth daily.      [provider]  fluticasone (FLONASE) 50 MCG/ACT nasal spray Place 2 sprays into the nose daily.      [provider]  hydrochlorothiazide (HYDRODIURIL) 25 MG tablet Take 1 tablet (25 mg total) by mouth daily. 02/26/18 05/27/18  Dionne Bucy, MD  hydrOXYzine (ATARAX/VISTARIL) 50 MG tablet Take 1 tablet (50 mg total) by mouth 3 (three) times daily as needed. 12/09/15   Joni Reining, PA-C  methylPREDNISolone (MEDROL DOSEPAK) 4 MG TBPK tablet Take Tapered dose as directed 12/09/15   Joni Reining, PA-C  metoprolol succinate (TOPROL XL) 25 MG 24 hr tablet Take 25 mg by mouth daily.      [provider]  nitroGLYCERIN (NITROSTAT) 0.4 MG SL tablet Place 0.4 mg under the tongue every 5 (five) minutes as needed.      [provider]  oxybutynin (DITROPAN) 5 MG tablet Take 5 mg by mouth 1 dose over 24 hours.      [provider]  predniSONE (DELTASONE) 50 MG tablet Take 1 tablet (50 mg total) by mouth daily with breakfast. 04/14/18   Jene Every, MD     Allergies Patient has no known allergies.  No family history on file.  Social History Social History   Tobacco Use  . Smoking status: Current Every Day  Smoker    Packs/day: 1.00    Types: Cigarettes  . Smokeless tobacco: Never Used  Substance Use Topics  . Alcohol use: No  . Drug use: No    Review of Systems  Constitutional: No fever/chills Eyes: No visual changes.  ENT: No sore throat. Cardiovascular: As above Respiratory: As above. Gastrointestinal: No abdominal pain.  No nausea, no vomiting.   Genitourinary: Negative for dysuria. Musculoskeletal: Negative for back pain. Skin: Negative for rash. Neurological: Negative for headaches or weakness   ____________________________________________   PHYSICAL EXAM:  VITAL SIGNS: ED Triage Vitals  Enc Vitals Group     BP 04/14/18 0851 (!) 177/81     Pulse Rate 04/14/18 0851 93     Resp 04/14/18 0851 16      Temp 04/14/18 0851 98.4 F (36.9 C)     Temp Source 04/14/18 0851 Oral     SpO2 04/14/18 0851 93 %     Weight 04/14/18 0852 36.3 kg (80 lb)     Height 04/14/18 0852 1.638 m (5' 4.5")     Head Circumference --      Peak Flow --      Pain Score 04/14/18 0852 8     Pain Loc --      Pain Edu? --      Excl. in GC? --     Constitutional: Alert and oriented. No acute distress. Pleasant and interactive Eyes: Conjunctivae are normal.   Nose: No congestion/rhinnorhea. Mouth/Throat: Mucous membranes are moist.   Neck:  Painless ROM Cardiovascular: Normal rate, regular rhythm. Grossly normal heart sounds.  Good peripheral circulation. Respiratory: Normal respiratory effort.  No retractions.  Scattered wheezes on exam Gastrointestinal: Soft and nontender. No distention.  . Genitourinary: deferred Musculoskeletal: No lower extremity tenderness nor edema.  Warm and well perfused Neurologic:  Normal speech and language. No gross focal neurologic deficits are appreciated.  Skin:  Skin is warm, dry and intact. No rash noted. Psychiatric: Mood and affect are normal. Speech and behavior are normal.  ____________________________________________   LABS (all labs ordered are listed, but only abnormal results are displayed)  Labs Reviewed  CBC - Abnormal; Notable for the following components:      Result Value   RDW 15.2 (*)    All other components within normal limits  COMPREHENSIVE METABOLIC PANEL - Abnormal; Notable for the following components:   ALT 11 (*)    All other components within normal limits   ____________________________________________  EKG  None ____________________________________________  RADIOLOGY  Chest x-ray unremarkable ____________________________________________   PROCEDURES  Procedure(s) performed: No  Procedures   Critical Care performed: No ____________________________________________   INITIAL IMPRESSION / ASSESSMENT AND PLAN / ED  COURSE  Pertinent labs & imaging results that were available during my care of the patient were reviewed by me and considered in my medical decision making (see chart for details).  Patient well-appearing in no acute distress.  She had near complete resolution of symptoms after 2 DuoNeb's.  She feels well.  We will treat with p.o. prednisone, recommend every 4 to 6-hour inhaler.  Lab work unremarkable, chest x-ray negative for pneumonia.    ____________________________________________   FINAL CLINICAL IMPRESSION(S) / ED DIAGNOSES  Final diagnoses:  Upper respiratory tract infection, unspecified type  COPD exacerbation (HCC)        Note:  This document was prepared using Dragon voice recognition software and may include unintentional dictation errors.    Jene Every, MD 04/14/18 1459

## 2018-04-14 NOTE — ED Notes (Signed)
First Nurse Note:  Patient complaining of SHOB.  Pulse ox 93% on room air.  Color pale, speaking in full sentences.  Congested cough noted.  States she has used her inhaler this AM.  RR - 24.

## 2018-04-14 NOTE — ED Notes (Signed)
Patient had breathing treatment.  Pulse ox 94% after TX.  Acuity changed to 3.

## 2018-04-14 NOTE — ED Notes (Signed)
Pt is AOx4, vital signs are stable, she does not show any signs of distress at this time. Pt reports that she was having shortness of breath but now it feels "a lot better." Pt denies any nausea, vomiting or chest pain at this time. We will continue to monitor the pt.

## 2018-04-14 NOTE — ED Notes (Signed)
First Nurse Note:  Patient getting 2nd neb tx, RR-16, pulse ox 100%, states she feels better.

## 2018-11-02 ENCOUNTER — Encounter: Payer: Self-pay | Admitting: Emergency Medicine

## 2018-11-02 ENCOUNTER — Emergency Department
Admission: EM | Admit: 2018-11-02 | Discharge: 2018-11-02 | Disposition: A | Discharge status: Home / Self Care | Payer: Medicare Other | Attending: Emergency Medicine | Admitting: Emergency Medicine

## 2018-11-02 ENCOUNTER — Other Ambulatory Visit: Payer: Self-pay

## 2018-11-02 ENCOUNTER — Emergency Department: Payer: Medicare Other

## 2018-11-02 DIAGNOSIS — I10 Essential (primary) hypertension: Secondary | ICD-10-CM | POA: Insufficient documentation

## 2018-11-02 DIAGNOSIS — R21 Rash and other nonspecific skin eruption: Secondary | ICD-10-CM | POA: Diagnosis present

## 2018-11-02 DIAGNOSIS — J019 Acute sinusitis, unspecified: Secondary | ICD-10-CM | POA: Insufficient documentation

## 2018-11-02 DIAGNOSIS — Z951 Presence of aortocoronary bypass graft: Secondary | ICD-10-CM | POA: Diagnosis not present

## 2018-11-02 DIAGNOSIS — J329 Chronic sinusitis, unspecified: Secondary | ICD-10-CM

## 2018-11-02 DIAGNOSIS — F17200 Nicotine dependence, unspecified, uncomplicated: Secondary | ICD-10-CM | POA: Diagnosis not present

## 2018-11-02 DIAGNOSIS — Z7982 Long term (current) use of aspirin: Secondary | ICD-10-CM | POA: Diagnosis not present

## 2018-11-02 DIAGNOSIS — Z79899 Other long term (current) drug therapy: Secondary | ICD-10-CM | POA: Insufficient documentation

## 2018-11-02 DIAGNOSIS — I2510 Atherosclerotic heart disease of native coronary artery without angina pectoris: Secondary | ICD-10-CM | POA: Diagnosis not present

## 2018-11-02 DIAGNOSIS — J449 Chronic obstructive pulmonary disease, unspecified: Secondary | ICD-10-CM | POA: Diagnosis not present

## 2018-11-02 MED ORDER — AZITHROMYCIN 250 MG PO TABS: ORAL_TABLET | ORAL | 0 refills | Status: AC

## 2018-11-02 MED ORDER — ALBUTEROL SULFATE HFA 108 (90 BASE) MCG/ACT IN AERS: 2.0000 | INHALATION_SPRAY | Freq: Four times a day (QID) | RESPIRATORY_TRACT | 2 refills | Status: DC | PRN

## 2018-11-02 MED ORDER — ALBUTEROL SULFATE (2.5 MG/3ML) 0.083% IN NEBU
5.0000 mg | INHALATION_SOLUTION | Freq: Once | RESPIRATORY_TRACT | Status: AC
  Administered 2018-11-02: 5 mg via RESPIRATORY_TRACT
  Filled 2018-11-02: qty 6

## 2018-11-02 MED ORDER — TRIAMCINOLONE ACETONIDE 0.5 % EX OINT: 1.0000 "application " | TOPICAL_OINTMENT | Freq: Two times a day (BID) | CUTANEOUS | 0 refills | Status: AC

## 2018-11-02 NOTE — ED Triage Notes (Addendum)
Pt c/o sore throat and rash x65month. Rash localized and noted on RT side of neck. States also sinus pressure. VSS. PT hx of COPD states has been out of her inhaler x33month due to financial reasons, increased SOB. PT able to speak in full sentences and ambulate, + cough. Denies fever.

## 2018-11-02 NOTE — ED Provider Notes (Signed)
[facility] Emergency Department Provider Note  Time seen: 7:08 PM  I have reviewed the triage vital signs and the nursing notes.   HISTORY  Chief Complaint Sore Throat and Rash    HPI Katherine Price is a 73 y.o. female with a past medical history of anxiety, COPD, depression, hypertension, hyperlipidemia presents to the emergency department for a rash and congestion.  According to the patient for the past 1 month she has been experiencing a rash to both sides of her neck which is itchy, as well as experiencing congestion with occasional dark sputum production.  States a history of sinus infections in the past which this feels identical.  Denies any fever.  Patient did have wheeze in triage was given a breathing treatment and states no shortness of breath at this time.  Denies any chest pain.   Past Medical History:  Diagnosis Date  . Anxiety   . Chronic back pain   . COPD (chronic obstructive pulmonary disease) (HCC)   . Coronary artery disease   . Depression   . Hyperlipidemia   . Hypertension   . Renal insufficiency     There are no active problems to display for this patient.   Past Surgical History:  Procedure Laterality Date  . CORONARY ARTERY BYPASS GRAFT  2003    Prior to Admission medications   Medication Sig Start Date End Date Taking? Authorizing Provider  albuterol (PROVENTIL HFA;VENTOLIN HFA) 108 (90 Base) MCG/ACT inhaler Inhale 2 puffs into the lungs every 6 (six) hours as needed for wheezing or shortness of breath. 02/26/18   Dionne Bucy, MD  ALPRAZolam Prudy Feeler) 0.25 MG tablet Take 0.25 mg by mouth at bedtime as needed.      [provider]  aspirin 81 MG tablet Take 81 mg by mouth daily.      [provider]  atorvastatin (LIPITOR) 40 MG tablet Take 40 mg by mouth daily.      [provider]  enalapril (VASOTEC) 20 MG tablet Take 20 mg by mouth daily.      [provider]  escitalopram  (LEXAPRO) 10 MG tablet Take 10 mg by mouth daily.      [provider]  fluticasone (FLONASE) 50 MCG/ACT nasal spray Place 2 sprays into the nose daily.      [provider]  hydrochlorothiazide (HYDRODIURIL) 25 MG tablet Take 1 tablet (25 mg total) by mouth daily. 02/26/18 05/27/18  Dionne Bucy, MD  hydrOXYzine (ATARAX/VISTARIL) 50 MG tablet Take 1 tablet (50 mg total) by mouth 3 (three) times daily as needed. 12/09/15   Joni Reining, PA-C  methylPREDNISolone (MEDROL DOSEPAK) 4 MG TBPK tablet Take Tapered dose as directed 12/09/15   Joni Reining, PA-C  metoprolol succinate (TOPROL XL) 25 MG 24 hr tablet Take 25 mg by mouth daily.      [provider]  nitroGLYCERIN (NITROSTAT) 0.4 MG SL tablet Place 0.4 mg under the tongue every 5 (five) minutes as needed.      [provider]  oxybutynin (DITROPAN) 5 MG tablet Take 5 mg by mouth 1 dose over 24 hours.      [provider]  predniSONE (DELTASONE) 50 MG tablet Take 1 tablet (50 mg total) by mouth daily with breakfast. 04/14/18   Jene Every, MD    No Known Allergies  No family history on file.  Social History Social History   Tobacco Use  . Smoking status: Current Every Day Smoker  Packs/day: 1.00    Types: Cigarettes  . Smokeless tobacco: Never Used  Substance Use Topics  . Alcohol use: No  . Drug use: No    Review of Systems Constitutional: Negative for fever. Eyes: Negative for visual complaints ENT: Positive for sinus congestion and pressure Cardiovascular: Negative for chest pain. Respiratory: Negative for shortness of breath. Gastrointestinal: Negative for abdominal pain Genitourinary: Negative for urinary compaints Musculoskeletal: Negative for musculoskeletal complaints Skin: Itchy rash to both sides of her neck x1 month Neurological: Negative for headache All other ROS negative  ____________________________________________   PHYSICAL EXAM:  VITAL SIGNS: ED  Triage Vitals  Enc Vitals Group     BP 11/02/18 1628 (!) 185/95     Pulse Rate 11/02/18 1628 99     Resp 11/02/18 1628 20     Temp 11/02/18 1628 98.9 F (37.2 C)     Temp Source 11/02/18 1628 Oral     SpO2 11/02/18 1628 99 %     Weight --      Height --      Head Circumference --      Peak Flow --      Pain Score 11/02/18 1629 5     Pain Loc --      Pain Edu? --      Excl. in GC? --    Constitutional: Alert and oriented. Well appearing and in no distress. Eyes: Normal exam ENT   Head: Normocephalic and atraumatic.  Patient does have tenderness to percussion of the bilateral maxillary and frontal sinuses.   Mouth/Throat: Mucous membranes are moist. Cardiovascular: Normal rate, regular rhythm. Respiratory: Normal respiratory effort without tachypnea nor retractions. Breath sounds are clear (received a DuoNeb in triage). Gastrointestinal: Soft and nontender. No distention. Musculoskeletal: Nontender with normal range of motion in all extremities. Neurologic:  Normal speech and language. No gross focal neurologic deficits  Skin: Patient has mild erythema with dry skin to both sides of upper neck.  States it is very itchy.  There is no vesicular rash.  It crosses the midline.  Not consistent with shingles. Psychiatric: Mood and affect are normal.   ____________________________________________    EKG  EKG viewed and interpreted by myself shows a normal sinus rhythm at 100 bpm with a narrow QRS, normal axis, normal intervals.  Patient does have inferolateral T wave inversions.  Unchanged from 04/14/2018. ____________________________________________    RADIOLOGY  Chest x-ray shows possible left upper lobe nodule discussed this with the patient for follow-up with her PCP in 3-6 months for repeat chest x-ray.  ____________________________________________   INITIAL IMPRESSION / ASSESSMENT AND PLAN / ED COURSE  Pertinent labs & imaging results that were available during my  care of the patient were reviewed by me and considered in my medical decision making (see chart for details).  Patient presents to the emergency department with complaints of a rash to both sides of her upper neck.  The rash on the neck is not consistent with shingles.  Is itchy and bilateral.  We will prescribe a steroid for the patient to be used topically.  Is a secondary complaint patient states sinus congestion x1 month with occasional dark sputum and now with sinus pressure over the past several days.  We will write for Zithromax.  Finally patient also states she is out of her albuterol inhaler.  Noted to have a mild wheeze in triage, no wheeze during my examination however this was after a DuoNeb treatment.  We will refill the patient's  home albuterol inhaler.  ____________________________________________   FINAL CLINICAL IMPRESSION(S) / ED DIAGNOSES  Sinusitis Rash    Minna Antis, MD 11/02/18 [phone]

## 2018-11-02 NOTE — ED Notes (Signed)
Pt ambulatory to POV without difficulty. VSS. NAD. Discharge instructions, RX and follow up reviewed. All questions and concerns addressed at this time.

## 2019-08-03 ENCOUNTER — Other Ambulatory Visit: Payer: Self-pay

## 2019-08-03 ENCOUNTER — Emergency Department: Payer: Medicare Other

## 2019-08-03 DIAGNOSIS — Z5321 Procedure and treatment not carried out due to patient leaving prior to being seen by health care provider: Secondary | ICD-10-CM | POA: Insufficient documentation

## 2019-08-03 DIAGNOSIS — I10 Essential (primary) hypertension: Secondary | ICD-10-CM | POA: Insufficient documentation

## 2019-08-03 LAB — BASIC METABOLIC PANEL
Anion gap: 11 (ref 5–15)
BUN: 10 mg/dL (ref 8–23)
CO2: 25 mmol/L (ref 22–32)
Calcium: 9.6 mg/dL (ref 8.9–10.3)
Chloride: 103 mmol/L (ref 98–111)
Creatinine, Ser: 1.02 mg/dL — ABNORMAL HIGH (ref 0.44–1.00)
GFR calc Af Amer: >60 mL/min (ref >60)
GFR calc non Af Amer: 54 mL/min — ABNORMAL LOW (ref >60)
Glucose, Bld: 98 mg/dL (ref 70–99)
Potassium: 5 mmol/L (ref 3.5–5.1)
Sodium: 139 mmol/L (ref 135–145)

## 2019-08-03 LAB — CBC
HCT: 39.8 % (ref 36.0–46.0)
Hemoglobin: 13.6 g/dL (ref 12.0–15.0)
MCH: 30.8 pg (ref 26.0–34.0)
MCHC: 34.2 g/dL (ref 30.0–36.0)
MCV: 90 fL (ref 80.0–100.0)
Platelets: 337 10*3/uL (ref 150–400)
RBC: 4.42 MIL/uL (ref 3.87–5.11)
RDW: 14.2 % (ref 11.5–15.5)
WBC: 8.6 10*3/uL (ref 4.0–10.5)
nRBC: 0 % (ref 0.0–0.2)

## 2019-08-03 LAB — TROPONIN I (HIGH SENSITIVITY): Troponin I (High Sensitivity): 7 ng/L (ref <18)

## 2019-08-03 NOTE — ED Triage Notes (Addendum)
Pt arrives to ED via POV from [facility] with c/o hypertension and tachycardia. Pt states she was being seen for a cough and was told her HR and BP was elevated and sent to the ED for evaluation. Pt denies CP; reports she's been out of her asthma inhaler for "2-3 months" and denies being r/x'd antihypertensives for high BP. No c/o N/V/D or fever. Pt is A&O, in NAD; RR even, regular, and unlabored.

## 2019-08-04 ENCOUNTER — Emergency Department
Admission: EM | Admit: 2019-08-04 | Discharge: 2019-08-04 | Disposition: A | Discharge status: Home / Self Care | Payer: Medicare Other | Attending: Emergency Medicine | Admitting: Emergency Medicine

## 2019-08-04 ENCOUNTER — Telehealth: Payer: Self-pay | Admitting: Emergency Medicine

## 2019-08-04 NOTE — ED Notes (Signed)
No answer when called several times from lobby 

## 2019-08-04 NOTE — Telephone Encounter (Signed)
Called patient due to lwot to inquire about condition and follow up plans. Left message.   

## 2019-08-06 ENCOUNTER — Other Ambulatory Visit: Payer: Self-pay

## 2019-08-06 ENCOUNTER — Emergency Department
Admission: EM | Admit: 2019-08-06 | Discharge: 2019-08-06 | Disposition: A | Discharge status: Home / Self Care | Payer: Medicare Other | Attending: Emergency Medicine | Admitting: Emergency Medicine

## 2019-08-06 ENCOUNTER — Encounter: Payer: Self-pay | Admitting: Emergency Medicine

## 2019-08-06 DIAGNOSIS — Z79899 Other long term (current) drug therapy: Secondary | ICD-10-CM | POA: Diagnosis not present

## 2019-08-06 DIAGNOSIS — I2510 Atherosclerotic heart disease of native coronary artery without angina pectoris: Secondary | ICD-10-CM | POA: Diagnosis not present

## 2019-08-06 DIAGNOSIS — J441 Chronic obstructive pulmonary disease with (acute) exacerbation: Secondary | ICD-10-CM | POA: Insufficient documentation

## 2019-08-06 DIAGNOSIS — Z7982 Long term (current) use of aspirin: Secondary | ICD-10-CM | POA: Diagnosis not present

## 2019-08-06 DIAGNOSIS — F17210 Nicotine dependence, cigarettes, uncomplicated: Secondary | ICD-10-CM | POA: Insufficient documentation

## 2019-08-06 DIAGNOSIS — Z951 Presence of aortocoronary bypass graft: Secondary | ICD-10-CM | POA: Insufficient documentation

## 2019-08-06 DIAGNOSIS — R0602 Shortness of breath: Secondary | ICD-10-CM | POA: Diagnosis present

## 2019-08-06 DIAGNOSIS — I10 Essential (primary) hypertension: Secondary | ICD-10-CM

## 2019-08-06 LAB — BASIC METABOLIC PANEL
Anion gap: 10 (ref 5–15)
BUN: 14 mg/dL (ref 8–23)
CO2: 25 mmol/L (ref 22–32)
Calcium: 9.5 mg/dL (ref 8.9–10.3)
Chloride: 104 mmol/L (ref 98–111)
Creatinine, Ser: 0.9 mg/dL (ref 0.44–1.00)
GFR calc Af Amer: >60 mL/min (ref >60)
GFR calc non Af Amer: >60 mL/min (ref >60)
Glucose, Bld: 95 mg/dL (ref 70–99)
Potassium: 3.6 mmol/L (ref 3.5–5.1)
Sodium: 139 mmol/L (ref 135–145)

## 2019-08-06 LAB — CBC WITH DIFFERENTIAL/PLATELET
Abs Immature Granulocytes: 0.02 10*3/uL (ref 0.00–0.07)
Basophils Absolute: 0.1 10*3/uL (ref 0.0–0.1)
Basophils Relative: 1 %
Eosinophils Absolute: 0.5 10*3/uL (ref 0.0–0.5)
Eosinophils Relative: 6 %
HCT: 41.3 % (ref 36.0–46.0)
Hemoglobin: 14 g/dL (ref 12.0–15.0)
Immature Granulocytes: 0 %
Lymphocytes Relative: 36 %
Lymphs Abs: 3.2 10*3/uL (ref 0.7–4.0)
MCH: 30.8 pg (ref 26.0–34.0)
MCHC: 33.9 g/dL (ref 30.0–36.0)
MCV: 91 fL (ref 80.0–100.0)
Monocytes Absolute: 0.4 10*3/uL (ref 0.1–1.0)
Monocytes Relative: 5 %
Neutro Abs: 4.5 10*3/uL (ref 1.7–7.7)
Neutrophils Relative %: 52 %
Platelets: 338 10*3/uL (ref 150–400)
RBC: 4.54 MIL/uL (ref 3.87–5.11)
RDW: 14.4 % (ref 11.5–15.5)
WBC: 8.8 10*3/uL (ref 4.0–10.5)
nRBC: 0 % (ref 0.0–0.2)

## 2019-08-06 LAB — TROPONIN I (HIGH SENSITIVITY): Troponin I (High Sensitivity): 8 ng/L (ref <18)

## 2019-08-06 MED ORDER — IPRATROPIUM-ALBUTEROL 0.5-2.5 (3) MG/3ML IN SOLN
3.0000 mL | Freq: Once | RESPIRATORY_TRACT | Status: AC
  Administered 2019-08-06: 3 mL via RESPIRATORY_TRACT
  Filled 2019-08-06: qty 3

## 2019-08-06 MED ORDER — IPRATROPIUM-ALBUTEROL 0.5-2.5 (3) MG/3ML IN SOLN
3.0000 mL | Freq: Once | RESPIRATORY_TRACT | Status: AC
  Administered 2019-08-06: 08:00:00 3 mL via RESPIRATORY_TRACT
  Filled 2019-08-06: qty 3

## 2019-08-06 MED ORDER — ENALAPRIL MALEATE 20 MG PO TABS: 20.0000 mg | ORAL_TABLET | Freq: Every day | ORAL | 0 refills | Status: DC

## 2019-08-06 MED ORDER — METHYLPREDNISOLONE SODIUM SUCC 125 MG IJ SOLR
125.0000 mg | Freq: Once | INTRAMUSCULAR | Status: AC
  Administered 2019-08-06: 125 mg via INTRAVENOUS
  Filled 2019-08-06: qty 2

## 2019-08-06 MED ORDER — HYDROCHLOROTHIAZIDE 25 MG PO TABS
25.0000 mg | ORAL_TABLET | Freq: Once | ORAL | Status: AC
  Administered 2019-08-06: 25 mg via ORAL
  Filled 2019-08-06: qty 1

## 2019-08-06 MED ORDER — METOPROLOL SUCCINATE ER 25 MG PO TB24: 25.0000 mg | ORAL_TABLET | Freq: Every day | ORAL | 0 refills | Status: DC

## 2019-08-06 MED ORDER — ALBUTEROL SULFATE HFA 108 (90 BASE) MCG/ACT IN AERS: 2.0000 | INHALATION_SPRAY | Freq: Four times a day (QID) | RESPIRATORY_TRACT | 1 refills | Status: DC | PRN

## 2019-08-06 MED ORDER — METOPROLOL SUCCINATE ER 50 MG PO TB24: 25.0000 mg | ORAL_TABLET | Freq: Every day | ORAL | Status: DC

## 2019-08-06 MED ORDER — PREDNISONE 20 MG PO TABS: 40.0000 mg | ORAL_TABLET | Freq: Every day | ORAL | 0 refills | Status: AC

## 2019-08-06 MED ORDER — ENALAPRIL MALEATE 10 MG PO TABS
20.0000 mg | ORAL_TABLET | Freq: Once | ORAL | Status: AC
  Administered 2019-08-06: 08:00:00 20 mg via ORAL
  Filled 2019-08-06: qty 2

## 2019-08-06 NOTE — ED Notes (Signed)
Pt given 2 warm blankets

## 2019-08-06 NOTE — ED Triage Notes (Signed)
Pt arrived via POV with reports of increased shortness of breath over the past several days. Pt was seen at [facility] and referred here on Friday. Pt left before being seen.  Pt states she has had recent elevated BP and hypertensive.   Pt states she has been out of her inhaler for the past 3 months.  Audible wheezing heard on arrival, short of breath when speaking in complete sentences.

## 2019-08-06 NOTE — ED Notes (Signed)
Pt ambulated around ED. Pt able to ambulate without shortness of breath. Pts SpO2 stayed between 98-100% on room air. Pts heart rate stable in the 90's during ambulation. Dr. Jari Pigg aware.

## 2019-08-06 NOTE — Discharge Instructions (Addendum)
Your work-up was reassuring.  We are prescribing you your albuterol to take every 4-6 hours as needed.  Also your steroids to help you with your asthma and COPD.  I am also prescribing you two blood pressure medications.  Your blood pressure was severely elevated today.  I do not want to restart all of the blood pressure medications at once given the risk of low blood pressures or dropping too fast.  Therefore it will be important that you follow-up with a primary doctor within 1 week to have your blood pressure rechecked.  At that time they may want to restart your hydrochlorothiazide.  Return to the ER for headaches, worsening shortness of breath or any other concerns

## 2019-08-06 NOTE — ED Provider Notes (Signed)
[facility] Emergency Department Provider Note  ____________________________________________   First MD Initiated Contact with Patient 08/06/19 [phone]     (approximate)  I have reviewed the triage vital signs and the nursing notes.   HISTORY  Chief Complaint Shortness of Breath    HPI Katherine Price is a 73 y.o. female with hypertension, hyperlipidemia, coronary disease status post CABG 20 years ago, COPD who presents with shortness of breath.  Patient was having increased shortness of breath for 3 months. [facility] but left before being seen.  Patient is been out of her inhaler for the past 3 months she says since then her shortness of breath has been intermittent, worse with exertion, better with rest, consistent with her prior COPD flares with wheezing and getting worse due to not using her inhaler because she ran out.  She denies any fevers, cough, chest pain, abdominal pain.  No leg swelling.  No history of blood clots.       Past Medical History:  Diagnosis Date  . Anxiety   . Chronic back pain   . COPD (chronic obstructive pulmonary disease) ([place])   . Coronary artery disease   . Depression   . Hyperlipidemia   . Hypertension   . Renal insufficiency     There are no active problems to display for this patient.   Past Surgical History:  Procedure Laterality Date  . CORONARY ARTERY BYPASS GRAFT  2003    Prior to Admission medications   Medication Sig Start Date End Date Taking? Authorizing Provider  albuterol (PROVENTIL HFA;VENTOLIN HFA) 108 (90 Base) MCG/ACT inhaler Inhale 2 puffs into the lungs every 6 (six) hours as needed for wheezing or shortness of breath. 11/02/18   Harvest Dark, MD  ALPRAZolam Duanne Moron) 0.25 MG tablet Take 0.25 mg by mouth at bedtime as needed.      [provider]  aspirin 81 MG tablet Take 81 mg by mouth daily.      [provider]  atorvastatin  (LIPITOR) 40 MG tablet Take 40 mg by mouth daily.      [provider]  enalapril (VASOTEC) 20 MG tablet Take 20 mg by mouth daily.      [provider]  escitalopram (LEXAPRO) 10 MG tablet Take 10 mg by mouth daily.      [provider]  fluticasone (FLONASE) 50 MCG/ACT nasal spray Place 2 sprays into the nose daily.      [provider]  hydrochlorothiazide (HYDRODIURIL) 25 MG tablet Take 1 tablet (25 mg total) by mouth daily. 02/26/18 05/27/18  Arta Silence, MD  hydrOXYzine (ATARAX/VISTARIL) 50 MG tablet Take 1 tablet (50 mg total) by mouth 3 (three) times daily as needed. 12/09/15   Sable Feil, PA-C  methylPREDNISolone (MEDROL DOSEPAK) 4 MG TBPK tablet Take Tapered dose as directed 12/09/15   Sable Feil, PA-C  metoprolol succinate (TOPROL XL) 25 MG 24 hr tablet Take 25 mg by mouth daily.      [provider]  nitroGLYCERIN (NITROSTAT) 0.4 MG SL tablet Place 0.4 mg under the tongue every 5 (five) minutes as needed.      [provider]  oxybutynin (DITROPAN) 5 MG tablet Take 5 mg by mouth 1 dose over 24 hours.      [provider]  predniSONE (DELTASONE) 50 MG tablet Take 1 tablet (50 mg total) by mouth daily with breakfast. 04/14/18   Corky Downs,  Molly Maduroobert, MD    Allergies Patient has no known allergies.  History reviewed. No pertinent family history.  Social History Social History   Tobacco Use  . Smoking status: Current Every Day Smoker    Packs/day: 1.00    Types: Cigarettes  . Smokeless tobacco: Never Used  Substance Use Topics  . Alcohol use: No  . Drug use: No      Review of Systems Constitutional: No fever/chills Eyes: No visual changes. ENT: No sore throat. Cardiovascular: No chest pain Respiratory: Positive for SOB Gastrointestinal: No abdominal pain.  No nausea, no vomiting.  No diarrhea.  No constipation. Genitourinary: Negative for dysuria. Musculoskeletal: Negative for back pain. Skin:  Negative for rash. Neurological: Negative for headaches, focal weakness or numbness. All other ROS negative ____________________________________________   PHYSICAL EXAM:  VITAL SIGNS: ED Triage Vitals  Enc Vitals Group     BP 08/06/19 0714 (!) 238/92     Pulse Rate 08/06/19 0714 82     Resp 08/06/19 0714 (!) 22     Temp 08/06/19 0714 98.1 F (36.7 C)     Temp Source 08/06/19 0714 Oral     SpO2 08/06/19 0714 97 %     Weight 08/06/19 0710 85 lb (38.6 kg)     Height 08/06/19 0710 5\' 4"  (1.626 m)     Head Circumference --      Peak Flow --      Pain Score 08/06/19 0710 0     Pain Loc --      Pain Edu? --      Excl. in GC? --     Constitutional: Alert and oriented. Well appearing and in no acute distress. Eyes: Conjunctivae are normal. EOMI. Head: Atraumatic. Nose: No congestion/rhinnorhea. Mouth/Throat: Mucous membranes are moist.   Neck: No stridor. Trachea Midline. FROM Cardiovascular: Normal rate, regular rhythm. Grossly normal heart sounds.  Good peripheral circulation.  Well-healing midsternal scar Respiratory: No increased work of breathing, bilateral wheezing Gastrointestinal: Soft and nontender. No distention. No abdominal bruits.  Musculoskeletal: No lower extremity tenderness nor edema.  No joint effusions. Neurologic:  Normal speech and language. No gross focal neurologic deficits are appreciated.  Skin:  Skin is warm, dry and intact. No rash noted. Psychiatric: Mood and affect are normal. Speech and behavior are normal. GU: Deferred   ____________________________________________   LABS (all labs ordered are listed, but only abnormal results are displayed)  Labs Reviewed  SARS CORONAVIRUS 2 (HOSPITAL ORDER, PERFORMED IN [facility])  CBC WITH DIFFERENTIAL/PLATELET  BASIC METABOLIC PANEL  TROPONIN I (HIGH SENSITIVITY)   ____________________________________________   ED ECG REPORT I, Concha SeMary E , the attending physician, personally viewed  and interpreted this ECG.  EKG is sinus rhythm rate of 90, flipped T waves that looks similar to prior, no ST elevation, normal intervals, occasional PVC ____________________________________________  RADIOLOGY    PROCEDURES  Procedure(s) performed (including Critical Care):  Procedures   ____________________________________________   INITIAL IMPRESSION / ASSESSMENT AND PLAN / ED COURSE   Katherine Price was evaluated in Emergency Department on 08/06/2019 for the symptoms described in the history of present illness. She was evaluated in the context of the global COVID-19 pandemic, which necessitated consideration that the patient might be at risk for infection with the SARS-CoV-2 virus that causes COVID-19. Institutional protocols and algorithms that pertain to the evaluation of patients at risk for COVID-19 are in a state of rapid change based on information released by regulatory bodies including the CDC  and federal and state organizations. These policies and algorithms were followed during the patient's care in the ED.     Pt presents with SOB.  This is most likely secondary to patient's COPD and running out of her inhaler..  Patient Already had chest x-ray done 3 days ago.  Patient declined get another chest x-ray today.  The chest x-ray did not show any signs of pneumonia, pneumothorax and her symptoms have stayed constant since a few days ago.  Patient also noted to be quite hypertensive in triage at 230 systolic.  Patient says she not been taking her blood pressure medications for a while now due to running out.  Will give patient her blood pressure meds and reevaluate her blood pressures.  This is asymptomatic hypertension at this time.  Anemia-CBC to evaluate ACS- will get trops, troponin 3 days ago was 7 so as long as is around here will assume that this is stable given patient denies any chest pain. Arrhythmia-Will get EKG and keep on monitor.  COVID- low suspicion givens  symptoms for 3 months. PE-lower suspicion given no risk factors and other cause more likely   We will give patient duo nebs, steroids and reevaluate with ambulatory saturation. Negative Gold criteria so will hold of on zpac.   Troponin is 8 which is stable from 3 days ago.  Offered repeat troponin testing but given her very low suspicion for ACS given this is been going on for 3 months due to not having her albuterol, patient would like to hold off on repeat troponin. EKG is stable from prior.   8:44 AM reevaluated patient.  Patient has no more wheezing.  Patient speaking in full sentences.  Patient feels much better.  Patient says she has not had a primary care doctor to follow-up for her blood pressure medicines.  I discussed that I would write a 1 month supply but she would need to follow-up with her primary care doctor.  Patient is normally on four blood pressure medicine but given I do not want to drop her too fast I will only restart 2 blood pressure medications.  Patient does not think that she is ever been on all 4 at once.  She thinks that at most she is only been on 2 at once.  Patient is instructed to follow-up with her primary care doctor for her blood pressure within 1  weeks.  Patient denies having any headache.  I discussed with patient that if her blood pressure stay that high that she is high risk of internal bleeding in her brain.  Patient expressed understanding.  Ambulatory sat 98-100% and felt very well.  Repeat BP down from 230s to 190s and continues to not have symptoms.     ____________________________________________   FINAL CLINICAL IMPRESSION(S) / ED DIAGNOSES   Final diagnoses:  COPD exacerbation (HCC)  Hypertension, unspecified type     MEDICATIONS GIVEN DURING THIS VISIT:  Medications  metoprolol succinate (TOPROL-XL) 24 hr tablet 25 mg (has no administration in time range)  ipratropium-albuterol (DUONEB) 0.5-2.5 (3) MG/3ML nebulizer solution 3 mL (3 mLs  Nebulization Given 08/06/19 0749)  ipratropium-albuterol (DUONEB) 0.5-2.5 (3) MG/3ML nebulizer solution 3 mL (3 mLs Nebulization Given 08/06/19 0749)  ipratropium-albuterol (DUONEB) 0.5-2.5 (3) MG/3ML nebulizer solution 3 mL (3 mLs Nebulization Given 08/06/19 0748)  methylPREDNISolone sodium succinate (SOLU-MEDROL) 125 mg/2 mL injection 125 mg (125 mg Intravenous Given 08/06/19 0746)  hydrochlorothiazide (HYDRODIURIL) tablet 25 mg (25 mg Oral Given 08/06/19 0748)  enalapril (VASOTEC)  tablet 20 mg (20 mg Oral Given 08/06/19 0748)     ED Discharge Orders         Ordered    albuterol (VENTOLIN HFA) 108 (90 Base) MCG/ACT inhaler  Every 6 hours PRN     08/06/19 0848    enalapril (VASOTEC) 20 MG tablet  Daily     08/06/19 0848    metoprolol succinate (TOPROL XL) 25 MG 24 hr tablet  Daily     08/06/19 0848    predniSONE (DELTASONE) 20 MG tablet  Daily     08/06/19 0851           Note:  This document was prepared using Dragon voice recognition software and may include unintentional dictation errors.   Concha SeFunke,  E, MD 08/06/19 [phone]

## 2019-09-04 ENCOUNTER — Encounter: Payer: Self-pay | Admitting: *Deleted

## 2019-09-11 ENCOUNTER — Other Ambulatory Visit
Admission: RE | Admit: 2019-09-11 | Discharge: 2019-09-11 | Disposition: A | Discharge status: Home / Self Care | Payer: Medicare Other | Source: Ambulatory Visit | Attending: Ophthalmology | Admitting: Ophthalmology

## 2019-09-11 DIAGNOSIS — Z01812 Encounter for preprocedural laboratory examination: Secondary | ICD-10-CM | POA: Insufficient documentation

## 2019-09-11 DIAGNOSIS — Z20828 Contact with and (suspected) exposure to other viral communicable diseases: Secondary | ICD-10-CM | POA: Insufficient documentation

## 2019-09-11 DIAGNOSIS — H2511 Age-related nuclear cataract, right eye: Secondary | ICD-10-CM | POA: Diagnosis not present

## 2019-09-11 LAB — SARS CORONAVIRUS 2 (TAT 6-24 HRS): SARS Coronavirus 2: NEGATIVE

## 2019-09-14 ENCOUNTER — Other Ambulatory Visit: Payer: Self-pay

## 2019-09-14 ENCOUNTER — Encounter
Admission: RE | Disposition: A | Discharge status: Home / Self Care | Payer: Self-pay | Source: Home / Self Care | Attending: Ophthalmology

## 2019-09-14 ENCOUNTER — Ambulatory Visit: Payer: Medicare Other | Admitting: Anesthesiology

## 2019-09-14 ENCOUNTER — Ambulatory Visit
Admission: RE | Admit: 2019-09-14 | Discharge: 2019-09-14 | Disposition: A | Discharge status: Home / Self Care | Payer: Medicare Other | Attending: Ophthalmology | Admitting: Ophthalmology

## 2019-09-14 DIAGNOSIS — J449 Chronic obstructive pulmonary disease, unspecified: Secondary | ICD-10-CM | POA: Diagnosis not present

## 2019-09-14 DIAGNOSIS — H2511 Age-related nuclear cataract, right eye: Secondary | ICD-10-CM | POA: Diagnosis not present

## 2019-09-14 DIAGNOSIS — I10 Essential (primary) hypertension: Secondary | ICD-10-CM | POA: Insufficient documentation

## 2019-09-14 DIAGNOSIS — Z955 Presence of coronary angioplasty implant and graft: Secondary | ICD-10-CM | POA: Diagnosis not present

## 2019-09-14 DIAGNOSIS — I252 Old myocardial infarction: Secondary | ICD-10-CM | POA: Diagnosis not present

## 2019-09-14 DIAGNOSIS — F17200 Nicotine dependence, unspecified, uncomplicated: Secondary | ICD-10-CM | POA: Diagnosis not present

## 2019-09-14 DIAGNOSIS — Z951 Presence of aortocoronary bypass graft: Secondary | ICD-10-CM | POA: Diagnosis not present

## 2019-09-14 HISTORY — DX: Personal history of peptic ulcer disease: Z87.11

## 2019-09-14 HISTORY — DX: Unspecified osteoarthritis, unspecified site: M19.90

## 2019-09-14 HISTORY — DX: Dyspnea, unspecified: R06.00

## 2019-09-14 HISTORY — DX: Pneumonia, unspecified organism: J18.9

## 2019-09-14 HISTORY — DX: Acute myocardial infarction, unspecified: I21.9

## 2019-09-14 HISTORY — DX: Cardiac murmur, unspecified: R01.1

## 2019-09-14 HISTORY — PX: CATARACT EXTRACTION W/PHACO: SHX586

## 2019-09-14 SURGERY — PHACOEMULSIFICATION, CATARACT, WITH IOL INSERTION
Anesthesia: Monitor Anesthesia Care | Site: Eye | Laterality: Right

## 2019-09-14 MED ORDER — TETRACAINE HCL 0.5 % OP SOLN
OPHTHALMIC | Status: AC
  Administered 2019-09-14: 09:00:00 1 [drp] via OPHTHALMIC
  Filled 2019-09-14: qty 4

## 2019-09-14 MED ORDER — POVIDONE-IODINE 5 % OP SOLN
OPHTHALMIC | Status: AC
  Filled 2019-09-14: qty 30

## 2019-09-14 MED ORDER — TRYPAN BLUE 0.06 % OP SOLN
OPHTHALMIC | Status: AC
  Filled 2019-09-14: qty 0.5

## 2019-09-14 MED ORDER — TRYPAN BLUE 0.06 % OP SOLN
OPHTHALMIC | Status: DC | PRN
  Administered 2019-09-14: 0.5 mL via INTRAOCULAR

## 2019-09-14 MED ORDER — NA HYALUR & NA CHOND-NA HYALUR 0.55-0.5 ML IO KIT
PACK | INTRAOCULAR | Status: AC
  Filled 2019-09-14: qty 1.05

## 2019-09-14 MED ORDER — NA CHONDROIT SULF-NA HYALURON 40-17 MG/ML IO SOLN
INTRAOCULAR | Status: AC
  Filled 2019-09-14: qty 1

## 2019-09-14 MED ORDER — ARMC OPHTHALMIC DILATING DROPS
1.0000 "application " | OPHTHALMIC | Status: AC
  Administered 2019-09-14 (×3): 1 via OPHTHALMIC

## 2019-09-14 MED ORDER — MOXIFLOXACIN HCL 0.5 % OP SOLN: 1.0000 [drp] | Freq: Once | OPHTHALMIC | Status: DC

## 2019-09-14 MED ORDER — POVIDONE-IODINE 5 % OP SOLN
OPHTHALMIC | Status: DC | PRN
  Administered 2019-09-14: 1 via OPHTHALMIC

## 2019-09-14 MED ORDER — EPINEPHRINE PF 1 MG/ML IJ SOLN
INTRAOCULAR | Status: DC | PRN
  Administered 2019-09-14: 200 mL via OPHTHALMIC

## 2019-09-14 MED ORDER — TETRACAINE HCL 0.5 % OP SOLN
1.0000 [drp] | Freq: Once | OPHTHALMIC | Status: AC
  Administered 2019-09-14 (×2): 1 [drp] via OPHTHALMIC

## 2019-09-14 MED ORDER — ONDANSETRON HCL 4 MG/2ML IJ SOLN
INTRAMUSCULAR | Status: DC | PRN
  Administered 2019-09-14: 4 mg via INTRAVENOUS

## 2019-09-14 MED ORDER — ARMC OPHTHALMIC DILATING DROPS
OPHTHALMIC | Status: AC
  Filled 2019-09-14: qty 0.5

## 2019-09-14 MED ORDER — EPINEPHRINE PF 1 MG/ML IJ SOLN
INTRAMUSCULAR | Status: AC
  Filled 2019-09-14: qty 1

## 2019-09-14 MED ORDER — MOXIFLOXACIN HCL 0.5 % OP SOLN
OPHTHALMIC | Status: DC | PRN
  Administered 2019-09-14: 0.2 mL via OPHTHALMIC

## 2019-09-14 MED ORDER — MIDAZOLAM HCL 2 MG/2ML IJ SOLN
INTRAMUSCULAR | Status: DC | PRN
  Administered 2019-09-14 (×2): 0.5 mg via INTRAVENOUS

## 2019-09-14 MED ORDER — MIDAZOLAM HCL 2 MG/2ML IJ SOLN
INTRAMUSCULAR | Status: AC
  Filled 2019-09-14: qty 2

## 2019-09-14 MED ORDER — NEOMYCIN-POLYMYXIN-DEXAMETH 3.5-10000-0.1 OP OINT
TOPICAL_OINTMENT | OPHTHALMIC | Status: AC
  Filled 2019-09-14: qty 3.5

## 2019-09-14 MED ORDER — NA CHONDROIT SULF-NA HYALURON 40-17 MG/ML IO SOLN
INTRAOCULAR | Status: DC | PRN
  Administered 2019-09-14 (×2): 1 mL via INTRAOCULAR

## 2019-09-14 MED ORDER — SODIUM CHLORIDE 0.9 % IV SOLN
INTRAVENOUS | Status: DC
  Administered 2019-09-14 (×2): via INTRAVENOUS

## 2019-09-14 MED ORDER — LIDOCAINE HCL (PF) 4 % IJ SOLN
INTRAMUSCULAR | Status: AC
  Filled 2019-09-14: qty 5

## 2019-09-14 MED ORDER — MOXIFLOXACIN HCL 0.5 % OP SOLN
OPHTHALMIC | Status: AC
  Filled 2019-09-14: qty 3

## 2019-09-14 MED ORDER — LIDOCAINE HCL (PF) 4 % IJ SOLN
INTRAOCULAR | Status: DC | PRN
  Administered 2019-09-14: 10:00:00 2 mL via OPHTHALMIC

## 2019-09-14 MED ORDER — LABETALOL HCL 5 MG/ML IV SOLN
INTRAVENOUS | Status: DC | PRN
  Administered 2019-09-14: 5 mg via INTRAVENOUS

## 2019-09-14 MED ORDER — FENTANYL CITRATE (PF) 100 MCG/2ML IJ SOLN
INTRAMUSCULAR | Status: AC
  Filled 2019-09-14: qty 2

## 2019-09-14 SURGICAL SUPPLY — 18 items
BNDG EYE OVAL (GAUZE/BANDAGES/DRESSINGS) ×6 IMPLANT
DISSECTOR HYDRO NUCLEUS 50X22 (MISCELLANEOUS) ×12 IMPLANT
DRSG TEGADERM 2-3/8X2-3/4 SM (GAUZE/BANDAGES/DRESSINGS) ×3 IMPLANT
GLOVE BIOGEL M 6.5 STRL (GLOVE) ×3 IMPLANT
GOWN STRL REUS W/ TWL LRG LVL3 (GOWN DISPOSABLE) ×1 IMPLANT
GOWN STRL REUS W/ TWL XL LVL3 (GOWN DISPOSABLE) ×1 IMPLANT
GOWN STRL REUS W/TWL LRG LVL3 (GOWN DISPOSABLE) ×2
GOWN STRL REUS W/TWL XL LVL3 (GOWN DISPOSABLE) ×2
KNIFE 45D UP 2.3 (MISCELLANEOUS) ×3 IMPLANT
LABEL CATARACT MEDS ST (LABEL) ×3 IMPLANT
LENS IOL TECNIS ITEC 22.5 (Intraocular Lens) ×3 IMPLANT
PACK CATARACT (MISCELLANEOUS) ×3 IMPLANT
PACK CATARACT KING (MISCELLANEOUS) ×3 IMPLANT
PACK EYE AFTER SURG (MISCELLANEOUS) ×3 IMPLANT
SOL BSS BAG (MISCELLANEOUS) ×3
SOLUTION BSS BAG (MISCELLANEOUS) ×1 IMPLANT
WATER STERILE IRR 250ML POUR (IV SOLUTION) ×3 IMPLANT
WIPE NON LINTING 3.25X3.25 (MISCELLANEOUS) ×3 IMPLANT

## 2019-09-14 NOTE — Transfer of Care (Signed)
Immediate Anesthesia Transfer of Care Note  Patient: Katherine Price  Procedure(s) Performed: CATARACT EXTRACTION PHACO AND INTRAOCULAR LENS PLACEMENT (IOC), RIGHT (Right Eye)  Patient Location: PACU  Anesthesia Type:MAC  Level of Consciousness: awake, alert , oriented and patient cooperative  Airway & Oxygen Therapy: Patient Spontanous Breathing  Post-op Assessment: Report given to RN and Post -op Vital signs reviewed and stable  Post vital signs: Reviewed and stable  Last Vitals:  Vitals Value Taken Time  BP    Temp    Pulse    Resp    SpO2      Last Pain:  Vitals:   09/14/19 0821  TempSrc: Tympanic  PainSc: 0-No pain         Complications: No apparent anesthesia complications

## 2019-09-14 NOTE — Discharge Instructions (Addendum)
Eye Surgery Discharge Instructions    Expect mild scratchy sensation or mild soreness. DO NOT RUB YOUR EYE!  The day of surgery:  Minimal physical activity, but bed rest is not required  No reading, computer work, or close hand work  No bending, lifting, or straining.  May watch TV  For 24 hours:  No driving, legal decisions, or alcoholic beverages  Safety precautions  Eat anything you prefer: It is better to start with liquids, then soup then solid foods.  _____ Eye patch should be worn until postoperative exam tomorrow.   Resume all regular medications including aspirin or Coumadin if these were discontinued prior to surgery. You may shower, bathe, shave, or wash your hair. Tylenol may be taken for mild discomfort.  Call your doctor if you experience significant pain, nausea, or vomiting, fever > 101 or other signs of infection. [phone] or [phone] Specific instructions:  Follow-up Information    Marchia Meiers, MD Follow up.   Specialty: Ophthalmology Why: [id number] on 10/16 Contact information: [address] [place] [address] [phone]

## 2019-09-14 NOTE — OR Nursing (Signed)
BP reported to Dr Ronelle Nigh no new orders given.

## 2019-09-14 NOTE — H&P (Signed)
   I have reviewed the patient's H&P and agree with its findings. There have been no interval changes.    MD Ophthalmology 

## 2019-09-14 NOTE — Anesthesia Preprocedure Evaluation (Signed)
Anesthesia Evaluation  Patient identified by MRN, date of birth, ID band Patient awake    Reviewed: Allergy & Precautions, NPO status , Patient's Chart, lab work & pertinent test results, reviewed documented beta blocker date and time   History of Anesthesia Complications Negative for: history of anesthetic complications  Airway Mallampati: II       Dental  (+) Edentulous Upper, Edentulous Lower   Pulmonary neg sleep apnea, COPD,  COPD inhaler, Current Smoker,           Cardiovascular hypertension, Pt. on medications and Pt. on home beta blockers + Past MI and + Cardiac Stents  (-) CHF (-) dysrhythmias (-) Valvular Problems/Murmurs     Neuro/Psych neg Seizures Anxiety Depression    GI/Hepatic Neg liver ROS, neg GERD  ,  Endo/Other  neg diabetes  Renal/GU Renal InsufficiencyRenal disease     Musculoskeletal   Abdominal   Peds  Hematology   Anesthesia Other Findings   Reproductive/Obstetrics                             Anesthesia Physical Anesthesia Plan  ASA: III  Anesthesia Plan: MAC   Post-op Pain Management:    Induction:   PONV Risk Score and Plan:   Airway Management Planned:   Additional Equipment:   Intra-op Plan:   Post-operative Plan:   Informed Consent: I have reviewed the patients History and Physical, chart, labs and discussed the procedure including the risks, benefits and alternatives for the proposed anesthesia with the patient or authorized representative who has indicated his/her understanding and acceptance.       Plan Discussed with:   Anesthesia Plan Comments:         Anesthesia Quick Evaluation

## 2019-09-14 NOTE — Anesthesia Post-op Follow-up Note (Signed)
Anesthesia QCDR form completed.        

## 2019-09-14 NOTE — Op Note (Signed)
  PREOPERATIVE DIAGNOSIS:  Nuclear sclerotic cataract of the RIGHT eye.   POSTOPERATIVE DIAGNOSIS:  Nuclear sclerotic cataract of the RIGHT eye.   OPERATIVE PROCEDURE: Cataract surgery OD   SURGEON:  Marchia Meiers, MD.   ANESTHESIA:  Anesthesiologist: Gunnar Fusi, MD CRNA: Kelton Pillar, CRNA  1.      Managed anesthesia care. 2.     0.66ml of Shugarcaine was instilled following the paracentesis   COMPLICATIONS:  None.   TECHNIQUE:   Divide and conquer   DESCRIPTION OF PROCEDURE:  The patient was examined and consented in the preoperative holding area where the aforementioned topical anesthesia was applied to the RIGHT eye and then brought back to the Operating Room where the RIGHT eye was prepped and draped in the usual sterile ophthalmic fashion and a lid speculum was placed. A paracentesis was created with the side port blade, the anterior chamber was washed out with trypan blue to stain the anterior capsule, and the anterior chamber was filled with viscoelastic. A near clear corneal incision was performed with the steel keratome. A continuous curvilinear capsulorrhexis was performed with a cystotome followed by the capsulorrhexis forceps. Hydrodissection and hydrodelineation were carried out with BSS on a blunt cannula. The lens was removed in a divide and conquer  technique and the remaining cortical material was removed with the irrigation-aspiration handpiece. The capsular bag was inflated with viscoelastic and the lens was placed in the capsular bag without complication. The remaining viscoelastic was removed from the eye with the irrigation-aspiration handpiece. The wounds were hydrated. The anterior chamber was flushed and the eye was inflated to physiologic pressure. 0.110ml Vigamox was placed in the anterior chamber. The wounds were found to be water tight. The eye was dressed with Vigamox. The patient was given protective glasses to wear throughout the day and a shield  with which to sleep tonight. The patient was also given drops with which to begin a drop regimen today and will follow-up with me in one day. Implant Name Type Inv. Item Serial No. Manufacturer Lot No. LRB No. Used Action  LENS IOL DIOP 22.5 - [id number] Intraocular Lens LENS IOL DIOP 22.5 [id number] [place]  Right 1 Implanted    Procedure(s) with comments: CATARACT EXTRACTION PHACO AND INTRAOCULAR LENS PLACEMENT (IOC), RIGHT (Right) - Lot #[id number] H [place]: 01:10.7 CDE; 12.49  Electronically signed:    09/14/2019 11:57 AM

## 2019-09-14 NOTE — Anesthesia Postprocedure Evaluation (Signed)
Anesthesia Post Note  Patient: Katherine Price  Procedure(s) Performed: CATARACT EXTRACTION PHACO AND INTRAOCULAR LENS PLACEMENT ([place]), RIGHT (Right Eye)  Patient location during evaluation: PACU Anesthesia Type: MAC Level of consciousness: awake and alert Pain management: pain level controlled Vital Signs Assessment: post-procedure vital signs reviewed and stable Respiratory status: spontaneous breathing, nonlabored ventilation and respiratory function stable Cardiovascular status: stable and blood pressure returned to baseline Postop Assessment: no apparent nausea or vomiting Anesthetic complications: no     Last Vitals:  Vitals:   09/14/19 0821 09/14/19 0956  BP: (!) 193/71 (!) 162/91  Pulse: (!) 58 63  Resp: 18 18  Temp: (!) 36.3 C (!) 36.3 C  SpO2: 99% 100%    Last Pain:  Vitals:   09/14/19 0956  TempSrc: Temporal  PainSc: 0-No pain                  F[place]Harrison

## 2019-10-09 ENCOUNTER — Encounter: Payer: Self-pay | Admitting: *Deleted

## 2019-10-16 ENCOUNTER — Other Ambulatory Visit
Admission: RE | Admit: 2019-10-16 | Discharge: 2019-10-16 | Disposition: A | Discharge status: Home / Self Care | Payer: Medicare Other | Source: Ambulatory Visit | Attending: Ophthalmology | Admitting: Ophthalmology

## 2019-10-16 ENCOUNTER — Other Ambulatory Visit: Payer: Self-pay

## 2019-10-16 DIAGNOSIS — Z20828 Contact with and (suspected) exposure to other viral communicable diseases: Secondary | ICD-10-CM | POA: Insufficient documentation

## 2019-10-16 DIAGNOSIS — Z01812 Encounter for preprocedural laboratory examination: Secondary | ICD-10-CM | POA: Diagnosis present

## 2019-10-16 LAB — SARS CORONAVIRUS 2 (TAT 6-24 HRS): SARS Coronavirus 2: NEGATIVE

## 2019-10-19 ENCOUNTER — Ambulatory Visit
Admission: RE | Admit: 2019-10-19 | Discharge: 2019-10-19 | Disposition: A | Discharge status: Home / Self Care | Payer: Medicare Other | Attending: Ophthalmology | Admitting: Ophthalmology

## 2019-10-19 ENCOUNTER — Encounter
Admission: RE | Disposition: A | Discharge status: Home / Self Care | Payer: Self-pay | Source: Home / Self Care | Attending: Ophthalmology

## 2019-10-19 ENCOUNTER — Other Ambulatory Visit: Payer: Self-pay

## 2019-10-19 ENCOUNTER — Ambulatory Visit: Payer: Medicare Other | Admitting: Anesthesiology

## 2019-10-19 ENCOUNTER — Encounter: Payer: Self-pay | Admitting: *Deleted

## 2019-10-19 DIAGNOSIS — H2512 Age-related nuclear cataract, left eye: Secondary | ICD-10-CM | POA: Insufficient documentation

## 2019-10-19 DIAGNOSIS — E7800 Pure hypercholesterolemia, unspecified: Secondary | ICD-10-CM | POA: Diagnosis not present

## 2019-10-19 DIAGNOSIS — Z79899 Other long term (current) drug therapy: Secondary | ICD-10-CM | POA: Diagnosis not present

## 2019-10-19 DIAGNOSIS — Z955 Presence of coronary angioplasty implant and graft: Secondary | ICD-10-CM | POA: Diagnosis not present

## 2019-10-19 DIAGNOSIS — I10 Essential (primary) hypertension: Secondary | ICD-10-CM | POA: Diagnosis not present

## 2019-10-19 DIAGNOSIS — Z951 Presence of aortocoronary bypass graft: Secondary | ICD-10-CM | POA: Insufficient documentation

## 2019-10-19 DIAGNOSIS — F17200 Nicotine dependence, unspecified, uncomplicated: Secondary | ICD-10-CM | POA: Diagnosis not present

## 2019-10-19 DIAGNOSIS — M1990 Unspecified osteoarthritis, unspecified site: Secondary | ICD-10-CM | POA: Diagnosis not present

## 2019-10-19 DIAGNOSIS — I252 Old myocardial infarction: Secondary | ICD-10-CM | POA: Insufficient documentation

## 2019-10-19 DIAGNOSIS — I2510 Atherosclerotic heart disease of native coronary artery without angina pectoris: Secondary | ICD-10-CM | POA: Insufficient documentation

## 2019-10-19 DIAGNOSIS — J449 Chronic obstructive pulmonary disease, unspecified: Secondary | ICD-10-CM | POA: Insufficient documentation

## 2019-10-19 HISTORY — PX: CATARACT EXTRACTION W/PHACO: SHX586

## 2019-10-19 HISTORY — DX: Unspecified chronic bronchitis: J42

## 2019-10-19 HISTORY — DX: Orthopnea: R06.01

## 2019-10-19 HISTORY — DX: Wheezing: R06.2

## 2019-10-19 SURGERY — PHACOEMULSIFICATION, CATARACT, WITH IOL INSERTION
Anesthesia: Monitor Anesthesia Care | Site: Eye | Laterality: Left

## 2019-10-19 MED ORDER — NA CHONDROIT SULF-NA HYALURON 40-17 MG/ML IO SOLN
INTRAOCULAR | Status: DC | PRN
  Administered 2019-10-19: 1 mL via INTRAOCULAR

## 2019-10-19 MED ORDER — POVIDONE-IODINE 5 % OP SOLN
OPHTHALMIC | Status: DC | PRN
  Administered 2019-10-19: 1 via OPHTHALMIC

## 2019-10-19 MED ORDER — MOXIFLOXACIN HCL 0.5 % OP SOLN
OPHTHALMIC | Status: AC
  Filled 2019-10-19: qty 3

## 2019-10-19 MED ORDER — NA HYALUR & NA CHOND-NA HYALUR 0.55-0.5 ML IO KIT
PACK | INTRAOCULAR | Status: DC | PRN
  Administered 2019-10-19: 1 via OPHTHALMIC

## 2019-10-19 MED ORDER — TETRACAINE HCL 0.5 % OP SOLN
OPHTHALMIC | Status: AC
  Filled 2019-10-19: qty 4

## 2019-10-19 MED ORDER — EPINEPHRINE PF 1 MG/ML IJ SOLN
INTRAOCULAR | Status: DC | PRN
  Administered 2019-10-19: 11:00:00 via OPHTHALMIC

## 2019-10-19 MED ORDER — FENTANYL CITRATE (PF) 100 MCG/2ML IJ SOLN
INTRAMUSCULAR | Status: AC
  Filled 2019-10-19: qty 2

## 2019-10-19 MED ORDER — TETRACAINE HCL 0.5 % OP SOLN
1.0000 [drp] | Freq: Two times a day (BID) | OPHTHALMIC | Status: AC
  Administered 2019-10-19 (×2): 1 [drp] via OPHTHALMIC

## 2019-10-19 MED ORDER — MOXIFLOXACIN HCL 0.5 % OP SOLN
OPHTHALMIC | Status: DC | PRN
  Administered 2019-10-19: 0.2 mL via OPHTHALMIC

## 2019-10-19 MED ORDER — SODIUM CHLORIDE 0.9 % IV SOLN
INTRAVENOUS | Status: DC
  Administered 2019-10-19 (×2): via INTRAVENOUS

## 2019-10-19 MED ORDER — FENTANYL CITRATE (PF) 100 MCG/2ML IJ SOLN
INTRAMUSCULAR | Status: DC | PRN
  Administered 2019-10-19 (×2): 25 ug via INTRAVENOUS

## 2019-10-19 MED ORDER — LIDOCAINE HCL (PF) 4 % IJ SOLN
INTRAOCULAR | Status: DC | PRN
  Administered 2019-10-19: 4 mL via OPHTHALMIC

## 2019-10-19 MED ORDER — ARMC OPHTHALMIC DILATING DROPS
OPHTHALMIC | Status: AC
  Administered 2019-10-19: 1 via OPHTHALMIC
  Filled 2019-10-19: qty 0.5

## 2019-10-19 MED ORDER — MIDAZOLAM HCL 2 MG/2ML IJ SOLN
INTRAMUSCULAR | Status: AC
  Filled 2019-10-19: qty 2

## 2019-10-19 MED ORDER — ARMC OPHTHALMIC DILATING DROPS
1.0000 "application " | OPHTHALMIC | Status: AC
  Administered 2019-10-19 (×3): 1 via OPHTHALMIC

## 2019-10-19 MED ORDER — MOXIFLOXACIN HCL 0.5 % OP SOLN: 1.0000 [drp] | Freq: Once | OPHTHALMIC | Status: DC

## 2019-10-19 MED ORDER — MIDAZOLAM HCL 2 MG/2ML IJ SOLN
INTRAMUSCULAR | Status: DC | PRN
  Administered 2019-10-19: 1 mg via INTRAVENOUS

## 2019-10-19 MED ORDER — TRYPAN BLUE 0.06 % OP SOLN
OPHTHALMIC | Status: DC | PRN
  Administered 2019-10-19: 0.5 mL via INTRAOCULAR

## 2019-10-19 SURGICAL SUPPLY — 18 items
DISSECTOR HYDRO NUCLEUS 50X22 (MISCELLANEOUS) ×12 IMPLANT
DRSG TEGADERM 2-3/8X2-3/4 SM (GAUZE/BANDAGES/DRESSINGS) ×3 IMPLANT
GLOVE BIOGEL M 6.5 STRL (GLOVE) ×3 IMPLANT
GOWN STRL REUS W/ TWL LRG LVL3 (GOWN DISPOSABLE) ×1 IMPLANT
GOWN STRL REUS W/ TWL XL LVL3 (GOWN DISPOSABLE) ×1 IMPLANT
GOWN STRL REUS W/TWL LRG LVL3 (GOWN DISPOSABLE) ×2
GOWN STRL REUS W/TWL XL LVL3 (GOWN DISPOSABLE) ×2
KIT SLEEVE INFUSION .9 MICRO (MISCELLANEOUS) ×2 IMPLANT
KNIFE 45D UP 2.3 (MISCELLANEOUS) ×3 IMPLANT
LABEL CATARACT MEDS ST (LABEL) ×3 IMPLANT
LENS IOL TECNIS ITEC 22.5 (Intraocular Lens) ×2 IMPLANT
PACK CATARACT (MISCELLANEOUS) ×3 IMPLANT
PACK CATARACT KING (MISCELLANEOUS) ×3 IMPLANT
PACK EYE AFTER SURG (MISCELLANEOUS) ×3 IMPLANT
SOL BSS BAG (MISCELLANEOUS) ×3
SOLUTION BSS BAG (MISCELLANEOUS) ×1 IMPLANT
WATER STERILE IRR 250ML POUR (IV SOLUTION) ×3 IMPLANT
WIPE NON LINTING 3.25X3.25 (MISCELLANEOUS) ×3 IMPLANT

## 2019-10-19 NOTE — H&P (Signed)
   I have reviewed the patient's H&P and agree with its findings. There have been no interval changes.    MD Ophthalmology 

## 2019-10-19 NOTE — Anesthesia Preprocedure Evaluation (Signed)
Anesthesia Evaluation  Patient identified by MRN, date of birth, ID band Patient awake    Reviewed: Allergy & Precautions, H&P , NPO status , reviewed documented beta blocker date and time   Airway Mallampati: II  TM Distance: >3 FB Neck ROM: full    Dental  (+) Edentulous Lower, Edentulous Upper   Pulmonary shortness of breath, pneumonia, COPD, Current Smoker and Patient abstained from smoking.,     + decreased breath sounds      Cardiovascular hypertension, + CAD, + Past MI, + Cardiac Stents and + Orthopnea  Normal cardiovascular exam+ Valvular Problems/Murmurs   Low risk Myoview 2012, Nml EF   Neuro/Psych PSYCHIATRIC DISORDERS Anxiety Depression    GI/Hepatic   Endo/Other    Renal/GU Renal disease     Musculoskeletal   Abdominal   Peds  Hematology   Anesthesia Other Findings Past Medical History: No date: Anxiety No date: Arthritis No date: Chronic back pain No date: Chronic bronchitis (HCC) No date: COPD (chronic obstructive pulmonary disease) (HCC) No date: Coronary artery disease No date: Depression No date: Dyspnea     Comment:  orthopnea No date: Heart murmur No date: History of peptic ulcer No date: Hyperlipidemia No date: Hypertension No date: Myocardial infarction ([place]) No date: Orthopnea No date: Pneumonia     Comment:  no to pneumonia but yes to chronic bronchitis No date: Renal insufficiency No date: Wheezing  Past Surgical History: No date: ABDOMINAL HYSTERECTOMY No date: BLADDER SUSPENSION 09/14/2019: CATARACT EXTRACTION W/PHACO; Right     Comment:  Procedure: CATARACT EXTRACTION PHACO AND INTRAOCULAR               LENS PLACEMENT ([place]), RIGHT;  Surgeon: Marchia Meiers, MD;              Location: [facility] ORS;  Service: Ophthalmology;  Laterality:              Right;  Lot #[id number] H [place]: 01:10.7 CDE; 12.49 2003: CORONARY ARTERY BYPASS GRAFT No date: LACERATION REPAIR     Comment:  of  forehead No date: TIBIA FRACTURE SURGERY     Comment:  rod implanted  BMI    Body Mass Index: 15.00 kg/m      Reproductive/Obstetrics                             Anesthesia Physical Anesthesia Plan  ASA: III  Anesthesia Plan: MAC   Post-op Pain Management:    Induction: Intravenous  PONV Risk Score and Plan: Treatment may vary due to age or medical condition and TIVA  Airway Management Planned: Nasal Cannula and Natural Airway  Additional Equipment:   Intra-op Plan:   Post-operative Plan:   Informed Consent: I have reviewed the patients History and Physical, chart, labs and discussed the procedure including the risks, benefits and alternatives for the proposed anesthesia with the patient or authorized representative who has indicated his/her understanding and acceptance.     Dental Advisory Given  Plan Discussed with: CRNA  Anesthesia Plan Comments:         Anesthesia Quick Evaluation

## 2019-10-19 NOTE — Discharge Instructions (Signed)
Eye Surgery Discharge Instructions    Expect mild scratchy sensation or mild soreness. DO NOT RUB YOUR EYE!  The day of surgery:  Minimal physical activity, but bed rest is not required  No reading, computer work, or close hand work  No bending, lifting, or straining.  May watch TV  For 24 hours:  No driving, legal decisions, or alcoholic beverages  Safety precautions  Eat anything you prefer: It is better to start with liquids, then soup then solid foods.  _____ Eye patch should be worn until postoperative exam tomorrow.  ____ Solar shield eyeglasses should be worn for comfort in the sunlight/patch while sleeping  Resume all regular medications including aspirin or Coumadin if these were discontinued prior to surgery. You may shower, bathe, shave, or wash your hair. Tylenol may be taken for mild discomfort.  Call your doctor if you experience significant pain, nausea, or vomiting, fever > 101 or other signs of infection. [phone] or [phone] Specific instructions:  Follow-up Information    Marchia Meiers, MD Follow up.   Specialty: Ophthalmology Why: 950 AM 11/20 AS SCHEDULED FOR POST OP FOLLOW UP Contact information: [address] [place] [address] [phone]

## 2019-10-19 NOTE — Anesthesia Postprocedure Evaluation (Signed)
Anesthesia Post Note  Patient: Katherine Price  Procedure(s) Performed: CATARACT EXTRACTION PHACO AND INTRAOCULAR LENS PLACEMENT (I[facility]) LEFT VISION BLUE (Left Eye)  Patient location during evaluation: Phase II Anesthesia Type: MAC Level of consciousness: awake and alert Pain management: pain level controlled Vital Signs Assessment: post-procedure vital signs reviewed and stable Respiratory status: spontaneous breathing, nonlabored ventilation and respiratory function stable Cardiovascular status: blood pressure returned to baseline and stable Postop Assessment: no apparent nausea or vomiting Anesthetic complications: no     Last Vitals:  Vitals:   10/19/19 1116 10/19/19 1149  BP: (!) 143/50 (!) 158/76  Pulse: 74 62  Resp: 16 16  Temp: 36.6 C (!) 36.1 C  SpO2: 100% 99%    Last Pain:  Vitals:   10/19/19 1149  TempSrc: Temporal  PainSc: 0-No pain                 Alphonsus Sias

## 2019-10-19 NOTE — Anesthesia Post-op Follow-up Note (Signed)
Anesthesia QCDR form completed.        

## 2019-10-19 NOTE — Transfer of Care (Signed)
Immediate Anesthesia Transfer of Care Note  Patient: Katherine Price  Procedure(s) Performed: CATARACT EXTRACTION PHACO AND INTRAOCULAR LENS PLACEMENT ([facility]) LEFT VISION BLUE (Left Eye)  Patient Location: PACU  Anesthesia Type:MAC  Level of Consciousness: awake and alert   Airway & Oxygen Therapy: Patient Spontanous Breathing  Post-op Assessment: Report given to RN and Post -op Vital signs reviewed and stable  Post vital signs: Reviewed and stable  Last Vitals:  Vitals Value Taken Time  BP    Temp    Pulse    Resp    SpO2      Last Pain:  Vitals:   10/19/19 0847  TempSrc: Temporal  PainSc: 0-No pain         Complications: No apparent anesthesia complications

## 2019-10-19 NOTE — Op Note (Signed)
  PREOPERATIVE DIAGNOSIS:  Nuclear sclerotic cataract of the LEFT eye.   POSTOPERATIVE DIAGNOSIS:  Nuclear sclerotic cataract of the LEFT eye.   OPERATIVE PROCEDURE: Cataract surgery OS   SURGEON:  Marchia Meiers, MD.   ANESTHESIA:  Anesthesiologist: Alphonsus Sias, MD CRNA: Allean Found, CRNA  1.      Managed anesthesia care. 2.     0.58ml of Shugarcaine was instilled following the paracentesis   COMPLICATIONS:  None.   TECHNIQUE:   Divide and conquer   DESCRIPTION OF PROCEDURE:  The patient was examined and consented in the preoperative holding area where the aforementioned topical anesthesia was applied to the LEFT eye and then brought back to the Operating Room where the left eye was prepped and draped in the usual sterile ophthalmic fashion and a lid speculum was placed. A paracentesis was created with the side port blade, the anterior chamber was washed out with trypan blue to stain the anterior capsule, and the anterior chamber was filled with viscoelastic. A near clear corneal incision was performed with the steel keratome. A continuous curvilinear capsulorrhexis was performed with a cystotome followed by the capsulorrhexis forceps. Hydrodissection and hydrodelineation were carried out with BSS on a blunt cannula. The lens was removed in a divide and conquer  technique and the remaining cortical material was removed with the irrigation-aspiration handpiece. The capsular bag was inflated with viscoelastic and the lens was placed in the capsular bag without complication. The remaining viscoelastic was removed from the eye with the irrigation-aspiration handpiece. The wounds were hydrated. The anterior chamber was flushed and the eye was inflated to physiologic pressure. 0.41ml Vigamox was placed in the anterior chamber. The wounds were found to be water tight. The eye was dressed with Vigamox. The patient was given protective glasses to wear throughout the day and a shield with which to  sleep tonight. The patient was also given drops with which to begin a drop regimen today and will follow-up with me in one day. Implant Name Type Inv. Item Serial No. Manufacturer Lot No. LRB No. Used Action  LENS IOL DIOP 22.5 - [id number] Intraocular Lens LENS IOL DIOP 22.5 [id number] [place]  Left 1 Implanted    Procedure(s) with comments: CATARACT EXTRACTION PHACO AND INTRAOCULAR LENS PLACEMENT ([facility]) LEFT VISION BLUE (Left) - [place] 01:19.8 CDE 11.74 Fluid Pack Lot 3 [id number]  Electronically signed: Marchia Meiers 10/19/2019 12:35 PM

## 2021-11-11 ENCOUNTER — Encounter: Payer: Self-pay | Admitting: Emergency Medicine

## 2021-11-11 ENCOUNTER — Emergency Department: Payer: Medicare (Managed Care)

## 2021-11-11 ENCOUNTER — Other Ambulatory Visit: Payer: Self-pay

## 2021-11-11 ENCOUNTER — Emergency Department
Admission: EM | Admit: 2021-11-11 | Discharge: 2021-11-11 | Disposition: A | Discharge status: Home / Self Care | Payer: Medicare (Managed Care) | Attending: Emergency Medicine | Admitting: Emergency Medicine

## 2021-11-11 DIAGNOSIS — I10 Essential (primary) hypertension: Secondary | ICD-10-CM | POA: Diagnosis not present

## 2021-11-11 DIAGNOSIS — Z79899 Other long term (current) drug therapy: Secondary | ICD-10-CM | POA: Diagnosis not present

## 2021-11-11 DIAGNOSIS — F17210 Nicotine dependence, cigarettes, uncomplicated: Secondary | ICD-10-CM | POA: Insufficient documentation

## 2021-11-11 DIAGNOSIS — I2510 Atherosclerotic heart disease of native coronary artery without angina pectoris: Secondary | ICD-10-CM | POA: Diagnosis not present

## 2021-11-11 DIAGNOSIS — S0990XA Unspecified injury of head, initial encounter: Secondary | ICD-10-CM | POA: Insufficient documentation

## 2021-11-11 DIAGNOSIS — M25561 Pain in right knee: Secondary | ICD-10-CM | POA: Insufficient documentation

## 2021-11-11 DIAGNOSIS — J449 Chronic obstructive pulmonary disease, unspecified: Secondary | ICD-10-CM | POA: Diagnosis not present

## 2021-11-11 DIAGNOSIS — W01198A Fall on same level from slipping, tripping and stumbling with subsequent striking against other object, initial encounter: Secondary | ICD-10-CM | POA: Diagnosis not present

## 2021-11-11 DIAGNOSIS — W19XXXA Unspecified fall, initial encounter: Secondary | ICD-10-CM

## 2021-11-11 NOTE — Discharge Instructions (Signed)
You can take 650 mg of Tylenol every four hours as needed for pain.

## 2021-11-11 NOTE — ED Triage Notes (Addendum)
Pt brought in by [facility] with c/o a fall she is complaining of right knee pain. She reports that she did hit her face. She has bruising on the left lower jaw when she fell the other day. She denies any LOC or taking any blood thinners. Pt reports that she was waling in the street and tripped over some rocks.

## 2021-11-11 NOTE — ED Notes (Signed)
First Nurse-Pt brought in via ems from a motel   pt fell and has right knee and nose pain    pt tripped on rocks. no loc.  Pt is homeless.  Son is also a pt in the ER also.  VS wnl per ems  per ems pt may have scabies

## 2021-11-11 NOTE — ED Notes (Signed)
Pt s/p fall onto concrete c/o bilateral knee pain with left being worse. Pt able to ambulate and was going to walk from lobby to flex.

## 2021-11-11 NOTE — ED Provider Notes (Signed)
[facility]-EMERGENCY DEPARTMENT  ____________________________________________  Time seen: Approximately 6:21 PM  I have reviewed the triage vital signs and the nursing notes.   HISTORY  Chief Complaint Fall   Patient     HPI Katherine Price is a 75 y.o. female presents to the emergency department primarily complaining of right knee pain.  Patient reports that she tripped over some rocks and did hit her face but denies loss of consciousness.  No neck pain.  No numbness or tingling in the upper and lower extremities.  No chest pain, chest tightness or shortness of breath.   Past Medical History:  Diagnosis Date   Anxiety    Arthritis    Chronic back pain    Chronic bronchitis (HCC)    COPD (chronic obstructive pulmonary disease) (HCC)    Coronary artery disease    Depression    Dyspnea    orthopnea   Heart murmur    History of peptic ulcer    Hyperlipidemia    Hypertension    Myocardial infarction (HCC)    Orthopnea    Pneumonia    no to pneumonia but yes to chronic bronchitis   Renal insufficiency    Wheezing      Immunizations up to date:  Yes.     Past Medical History:  Diagnosis Date   Anxiety    Arthritis    Chronic back pain    Chronic bronchitis (HCC)    COPD (chronic obstructive pulmonary disease) (HCC)    Coronary artery disease    Depression    Dyspnea    orthopnea   Heart murmur    History of peptic ulcer    Hyperlipidemia    Hypertension    Myocardial infarction (HCC)    Orthopnea    Pneumonia    no to pneumonia but yes to chronic bronchitis   Renal insufficiency    Wheezing     There are no problems to display for this patient.   Past Surgical History:  Procedure Laterality Date   ABDOMINAL HYSTERECTOMY     BLADDER SUSPENSION     CATARACT EXTRACTION W/PHACO Right 09/14/2019   Procedure: CATARACT EXTRACTION PHACO AND INTRAOCULAR LENS PLACEMENT (IOC), RIGHT;  Surgeon: Elliot Cousin, MD;  Location: [facility] ORS;  Service: Ophthalmology;   Laterality: Right;  Lot #[id number] H [place]: 01:10.7 CDE; 12.49   CATARACT EXTRACTION W/PHACO Left 10/19/2019   Procedure: CATARACT EXTRACTION PHACO AND INTRAOCULAR LENS PLACEMENT (IOC) LEFT VISION BLUE;  Surgeon: Elliot Cousin, MD;  Location: [facility] ORS;  Service: Ophthalmology;  Laterality: Left;  [place] 01:19.8 CDE 11.74 Fluid Pack Lot 3 [id number]   CORONARY ARTERY BYPASS GRAFT  2003   LACERATION REPAIR     of forehead   TIBIA FRACTURE SURGERY     rod implanted    Prior to Admission medications   Medication Sig Start Date End Date Taking? Authorizing Provider  albuterol (VENTOLIN HFA) 108 (90 Base) MCG/ACT inhaler Inhale 2 puffs into the lungs every 6 (six) hours as needed for wheezing or shortness of breath. 08/06/19   Concha Se, MD  enalapril (VASOTEC) 20 MG tablet Take 1 tablet (20 mg total) by mouth daily. 08/06/19 10/09/19  Concha Se, MD  hydrochlorothiazide (HYDRODIURIL) 25 MG tablet Take 1 tablet (25 mg total) by mouth daily. Patient not taking: Reported on 09/11/2019 02/26/18 09/11/19  Dionne Bucy, MD  ibuprofen (ADVIL) 200 MG tablet Take 600 mg by mouth 2 (two) times daily as needed for moderate pain.  [provider]  loperamide (IMODIUM) 1 MG/5ML solution Take 1 mg by mouth as needed for diarrhea or loose stools.    [provider]  metoprolol succinate (TOPROL XL) 25 MG 24 hr tablet Take 1 tablet (25 mg total) by mouth daily. 08/06/19 10/09/19  Concha Se, MD    Allergies Patient has no known allergies.  No family history on file.  Social History Social History   Tobacco Use   Smoking status: Every Day    Packs/day: 1.00    Types: Cigarettes   Smokeless tobacco: Never  Substance Use Topics   Alcohol use: No   Drug use: No     Review of Systems  Constitutional: No fever/chills Eyes:  No discharge ENT: No upper respiratory complaints. Respiratory: no cough. No SOB/ use of accessory muscles to breath Gastrointestinal:   No nausea, no  vomiting.  No diarrhea.  No constipation. Musculoskeletal: Patient has right knee pain.  Skin: Negative for rash, abrasions, lacerations, ecchymosis.    ____________________________________________   PHYSICAL EXAM:  VITAL SIGNS: ED Triage Vitals  Enc Vitals Group     BP 11/11/21 1538 (!) 147/77     Pulse Rate 11/11/21 1538 69     Resp 11/11/21 1538 20     Temp 11/11/21 1538 98.5 F (36.9 C)     Temp Source 11/11/21 1538 Oral     SpO2 11/11/21 1538 95 %     Weight 11/11/21 1539 90 lb (40.8 kg)     Height 11/11/21 1539 5' 4.5" (1.638 m)     Head Circumference --      Peak Flow --      Pain Score 11/11/21 1538 7     Pain Loc --      Pain Edu? --      Excl. in GC? --      Constitutional: Alert and oriented. Well appearing and in no acute distress. Eyes: Conjunctivae are normal. PERRL. EOMI. Head: Atraumatic. ENT:      Nose: No congestion/rhinnorhea.      Mouth/Throat: Mucous membranes are moist.  Neck: No stridor.  No cervical spine tenderness to palpation. Cardiovascular: Normal rate, regular rhythm. Normal S1 and S2.  Good peripheral circulation. Respiratory: Normal respiratory effort without tachypnea or retractions. Lungs CTAB. Good air entry to the bases with no decreased or absent breath sounds Gastrointestinal: Bowel sounds x 4 quadrants. Soft and nontender to palpation. No guarding or rigidity. No distention. Musculoskeletal: Full range of motion to all extremities. No obvious deformities noted Neurologic:  Normal for age. No gross focal neurologic deficits are appreciated.  Skin:  Skin is warm, dry and intact. No rash noted. Psychiatric: Mood and affect are normal for age. Speech and behavior are normal.   ____________________________________________   LABS (all labs ordered are listed, but only abnormal results are displayed)  Labs Reviewed - No data to  display ____________________________________________  EKG   ____________________________________________  RADIOLOGY Geraldo Pitter, personally viewed and evaluated these images (plain radiographs) as part of my medical decision making, as well as reviewing the written report by the radiologist.  CT Head Wo Contrast  Result Date: 11/11/2021 CLINICAL DATA:  Fall, trauma EXAM: CT HEAD WITHOUT CONTRAST CT MAXILLOFACIAL WITHOUT CONTRAST CT CERVICAL SPINE WITHOUT CONTRAST TECHNIQUE: Multidetector CT imaging of the head, cervical spine, and maxillofacial structures were performed using the standard protocol without intravenous contrast. Multiplanar CT image reconstructions of the cervical spine and maxillofacial structures were also generated. COMPARISON:  CT  brain 05/06/2011 FINDINGS: CT HEAD FINDINGS Brain: No acute territorial infarction, hemorrhage or intracranial mass. Mild atrophy. Fairly extensive white matter hypodensity consistent with chronic small vessel ischemic change. Small chronic infarcts within the right white matter and basal ganglia with mild ex vacuo dilatation of the right lateral ventricle. Ventricles are nonenlarged. Vascular: No hyperdense vessels.  Carotid vascular calcification Skull: Normal. Negative for fracture or focal lesion. Other: None CT MAXILLOFACIAL FINDINGS Osseous: Mastoid air cells are clear. Slight anterior sublux of left mandibular head, probably chronic. No mandibular fracture. Zygomatic arches and pterygoid plates are intact. No nasal bone fracture Orbits: Negative. No traumatic or inflammatory finding. Sinuses: Thickening of the maxillary sinus walls consistent with chronic sinusitis. Mild mucosal thickening. Soft tissues: Negative. CT CERVICAL SPINE FINDINGS Alignment: 3 mm anterolisthesis C3 on C4 with trace retrolisthesis C5 on C6. Trace anterolisthesis C7 on T1. Mild reversal of cervical lordosis. Facet alignment within normal limits Skull base and vertebrae:  No acute fracture. No primary bone lesion or focal pathologic process. Soft tissues and spinal canal: No prevertebral fluid or swelling. No visible canal hematoma. Disc levels: Advanced degenerative changes C3 through C7 with disc space narrowing and osteophyte. Hypertrophic facet degenerative changes at multiple levels with foraminal stenosis. Upper chest: Negative. Other: None IMPRESSION: 1. No CT evidence for acute intracranial abnormality. Atrophy and chronic small vessel ischemic changes of the white matter. 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 No acute facial bone fracture 3. Reversal of cervical lordosis with trace listhesis at multiple levels likely degenerative. No fracture is seen. Electronically Signed   By: Jasmine Pang M.D.   On: 11/11/2021 17:49   CT Cervical Spine Wo Contrast  Result Date: 11/11/2021 CLINICAL DATA:  Fall, trauma EXAM: CT HEAD WITHOUT CONTRAST CT MAXILLOFACIAL WITHOUT CONTRAST CT CERVICAL SPINE WITHOUT CONTRAST TECHNIQUE: Multidetector CT imaging of the head, cervical spine, and maxillofacial structures were performed using the standard protocol without intravenous contrast. Multiplanar CT image reconstructions of the cervical spine and maxillofacial structures were also generated. COMPARISON:  CT brain 05/06/2011 FINDINGS: CT HEAD FINDINGS Brain: No acute territorial infarction, hemorrhage or intracranial mass. Mild atrophy. Fairly extensive white matter hypodensity consistent with chronic small vessel ischemic change. Small chronic infarcts within the right white matter and basal ganglia with mild ex vacuo dilatation of the right lateral ventricle. Ventricles are nonenlarged. Vascular: No hyperdense vessels.  Carotid vascular calcification Skull: Normal. Negative for fracture or focal lesion. Other: None CT MAXILLOFACIAL FINDINGS Osseous: Mastoid air cells are clear. Slight anterior sublux of left mandibular head, probably chronic.  No mandibular fracture. Zygomatic arches and pterygoid plates are intact. No nasal bone fracture Orbits: Negative. No traumatic or inflammatory finding. Sinuses: Thickening of the maxillary sinus walls consistent with chronic sinusitis. Mild mucosal thickening. Soft tissues: Negative. CT CERVICAL SPINE FINDINGS Alignment: 3 mm anterolisthesis C3 on C4 with trace retrolisthesis C5 on C6. Trace anterolisthesis C7 on T1. Mild reversal of cervical lordosis. Facet alignment within normal limits Skull base and vertebrae: No acute fracture. No primary bone lesion or focal pathologic process. Soft tissues and spinal canal: No prevertebral fluid or swelling. No visible canal hematoma. Disc levels: Advanced degenerative changes C3 through C7 with disc space narrowing and osteophyte. Hypertrophic facet degenerative changes at multiple levels with foraminal stenosis. Upper chest: Negative. Other: None IMPRESSION: 1. No CT evidence for acute intracranial abnormality. Atrophy and chronic small vessel ischemic changes of the white matter. Multiple small chronic infarcts within the right  white matter and basal ganglia 2. No acute facial bone fracture 3. Reversal of cervical lordosis with trace listhesis at multiple levels likely degenerative. No fracture is seen. Electronically Signed   By: Jasmine Pang M.D.   On: 11/11/2021 17:49   DG Knee Complete 4 Views Right  Result Date: 11/11/2021 CLINICAL DATA:  Fall, right knee EXAM: RIGHT KNEE - COMPLETE 4+ VIEW COMPARISON:  None. FINDINGS: Sclerotic areas in the mid to distal femoral shaft compatible with old bone infarcts. Joint space narrowing in the medial and patellofemoral compartments with mild spurring. No joint effusion. No acute bony abnormality. Specifically, no fracture, subluxation, or dislocation. IMPRESSION: Mild degenerative changes. 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 No acute bony abnormality. Electronically Signed   By: Charlett Nose M.D.   On: 11/11/2021  17:33   CT Maxillofacial Wo Contrast  Result Date: 11/11/2021 CLINICAL DATA:  Fall, trauma EXAM: CT HEAD WITHOUT CONTRAST CT MAXILLOFACIAL WITHOUT CONTRAST CT CERVICAL SPINE WITHOUT CONTRAST TECHNIQUE: Multidetector CT imaging of the head, cervical spine, and maxillofacial structures were performed using the standard protocol without intravenous contrast. Multiplanar CT image reconstructions of the cervical spine and maxillofacial structures were also generated. COMPARISON:  CT brain 05/06/2011 FINDINGS: CT HEAD FINDINGS Brain: No acute territorial infarction, hemorrhage or intracranial mass. Mild atrophy. Fairly extensive white matter hypodensity consistent with chronic small vessel ischemic change. Small chronic infarcts within the right white matter and basal ganglia with mild ex vacuo dilatation of the right lateral ventricle. Ventricles are nonenlarged. Vascular: No hyperdense vessels.  Carotid vascular calcification Skull: Normal. Negative for fracture or focal lesion. Other: None CT MAXILLOFACIAL FINDINGS Osseous: Mastoid air cells are clear. Slight anterior sublux of left mandibular head, probably chronic. No mandibular fracture. Zygomatic arches and pterygoid plates are intact. No nasal bone fracture Orbits: Negative. No traumatic or inflammatory finding. Sinuses: Thickening of the maxillary sinus walls consistent with chronic sinusitis. Mild mucosal thickening. Soft tissues: Negative. CT CERVICAL SPINE FINDINGS Alignment: 3 mm anterolisthesis C3 on C4 with trace retrolisthesis C5 on C6. Trace anterolisthesis C7 on T1. Mild reversal of cervical lordosis. Facet alignment within normal limits Skull base and vertebrae: No acute fracture. No primary bone lesion or focal pathologic process. Soft tissues and spinal canal: No prevertebral fluid or swelling. No visible canal hematoma. Disc levels: Advanced degenerative changes C3 through C7 with disc space narrowing and osteophyte. Hypertrophic facet  degenerative changes at multiple levels with foraminal stenosis. Upper chest: Negative. Other: None IMPRESSION: 1. No CT evidence for acute intracranial abnormality. Atrophy and chronic small vessel ischemic changes of the white matter. 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 No acute facial bone fracture 3. Reversal of cervical lordosis with trace listhesis at multiple levels likely degenerative. No fracture is seen. Electronically Signed   By: Jasmine Pang M.D.   On: 11/11/2021 17:49    ____________________________________________    PROCEDURES  Procedure(s) performed:     Procedures     Medications - No data to display   ____________________________________________   INITIAL IMPRESSION / ASSESSMENT AND PLAN / ED COURSE  Pertinent labs & imaging results that were available during my care of the patient were reviewed by me and considered in my medical decision making (see chart for details).      Assessment and plan Fall 75 year old female presents to the emergency department after a mechanical fall.  Vital signs are reassuring at triage.  Patient was alert, active and nontoxic-appearing.  No  bony abnormality on x-ray of the right knee.  No evidence of facial fracture, intracranial bleed, skull fracture or C-spine fracture on dedicated CTs.  Tylenol was recommended for discomfort.  All patient questions were answered.     ____________________________________________  FINAL CLINICAL IMPRESSION(S) / ED DIAGNOSES  Final diagnoses:  Fall, initial encounter      NEW MEDICATIONS STARTED DURING THIS VISIT:  ED Discharge Orders     None           This chart was dictated using voice recognition software/Dragon. Despite best efforts to proofread, errors can occur which can change the meaning. Any change was purely unintentional.     Orvil Feil, PA-C 11/11/21 1823    Gilles Chiquito, MD 11/11/21 2040

## 2021-12-25 ENCOUNTER — Encounter: Payer: Self-pay | Admitting: Nurse Practitioner

## 2021-12-25 ENCOUNTER — Other Ambulatory Visit: Payer: Self-pay

## 2021-12-25 ENCOUNTER — Ambulatory Visit: Payer: Medicare (Managed Care) | Admitting: Nurse Practitioner

## 2021-12-25 VITALS — BP 130/78 | HR 98 | Temp 98.4°F | Resp 18 | Ht 63.0 in | Wt 82.4 lb

## 2021-12-25 DIAGNOSIS — J449 Chronic obstructive pulmonary disease, unspecified: Secondary | ICD-10-CM

## 2021-12-25 DIAGNOSIS — F419 Anxiety disorder, unspecified: Secondary | ICD-10-CM

## 2021-12-25 DIAGNOSIS — M545 Low back pain, unspecified: Secondary | ICD-10-CM

## 2021-12-25 DIAGNOSIS — F32A Depression, unspecified: Secondary | ICD-10-CM

## 2021-12-25 DIAGNOSIS — I10 Essential (primary) hypertension: Secondary | ICD-10-CM | POA: Diagnosis not present

## 2021-12-25 DIAGNOSIS — G8929 Other chronic pain: Secondary | ICD-10-CM

## 2021-12-25 DIAGNOSIS — Z7689 Persons encountering health services in other specified circumstances: Secondary | ICD-10-CM

## 2021-12-25 DIAGNOSIS — I2510 Atherosclerotic heart disease of native coronary artery without angina pectoris: Secondary | ICD-10-CM

## 2021-12-25 DIAGNOSIS — I219 Acute myocardial infarction, unspecified: Secondary | ICD-10-CM

## 2021-12-25 DIAGNOSIS — E785 Hyperlipidemia, unspecified: Secondary | ICD-10-CM

## 2021-12-25 DIAGNOSIS — M1990 Unspecified osteoarthritis, unspecified site: Secondary | ICD-10-CM

## 2021-12-25 DIAGNOSIS — Z111 Encounter for screening for respiratory tuberculosis: Secondary | ICD-10-CM

## 2021-12-25 DIAGNOSIS — Z13220 Encounter for screening for lipoid disorders: Secondary | ICD-10-CM

## 2021-12-25 LAB — COMPLETE METABOLIC PANEL WITH GFR
AG Ratio: 1.9 (calc) (ref 1.0–2.5)
ALT: 10 U/L (ref 6–29)
AST: 13 U/L (ref 10–35)
Albumin: 4.3 g/dL (ref 3.6–5.1)
Alkaline phosphatase (APISO): 89 U/L (ref 37–153)
BUN: 9 mg/dL (ref 7–25)
CO2: 31 mmol/L (ref 20–32)
Calcium: 9.6 mg/dL (ref 8.6–10.4)
Chloride: 101 mmol/L (ref 98–110)
Creat: 0.91 mg/dL (ref 0.60–1.00)
Globulin: 2.3 g/dL (calc) (ref 1.9–3.7)
Glucose, Bld: 90 mg/dL (ref 65–99)
Potassium: 4.5 mmol/L (ref 3.5–5.3)
Sodium: 138 mmol/L (ref 135–146)
Total Bilirubin: 0.3 mg/dL (ref 0.2–1.2)
Total Protein: 6.6 g/dL (ref 6.1–8.1)
eGFR: 65 mL/min/{1.73_m2} (ref >=60)

## 2021-12-25 LAB — CBC WITH DIFFERENTIAL/PLATELET
Absolute Monocytes: 389 cells/uL (ref 200–950)
Basophils Absolute: 86 cells/uL (ref 0–200)
Basophils Relative: 1.2 %
Eosinophils Absolute: 158 cells/uL (ref 15–500)
Eosinophils Relative: 2.2 %
HCT: 37.8 % (ref 35.0–45.0)
Hemoglobin: 12.8 g/dL (ref 11.7–15.5)
Lymphs Abs: 1584 cells/uL (ref 850–3900)
MCH: 30.3 pg (ref 27.0–33.0)
MCHC: 33.9 g/dL (ref 32.0–36.0)
MCV: 89.4 fL (ref 80.0–100.0)
MPV: 10.1 fL (ref 7.5–12.5)
Monocytes Relative: 5.4 %
Neutro Abs: 4982 cells/uL (ref 1500–7800)
Neutrophils Relative %: 69.2 %
Platelets: 389 10*3/uL (ref 140–400)
RBC: 4.23 10*6/uL (ref 3.80–5.10)
RDW: 13.8 % (ref 11.0–15.0)
Total Lymphocyte: 22 %
WBC: 7.2 10*3/uL (ref 3.8–10.8)

## 2021-12-25 LAB — LIPID PANEL
Cholesterol: 266 mg/dL — ABNORMAL HIGH (ref <200)
HDL: 77 mg/dL (ref >=50)
LDL Cholesterol (Calc): 161 mg/dL (calc) — ABNORMAL HIGH
Non-HDL Cholesterol (Calc): 189 mg/dL (calc) — ABNORMAL HIGH (ref <130)
Total CHOL/HDL Ratio: 3.5 (calc) (ref <5.0)
Triglycerides: 150 mg/dL — ABNORMAL HIGH (ref <150)

## 2021-12-25 NOTE — Progress Notes (Signed)
BP 130/78 (BP Location: Right Arm)    Pulse 98    Temp 98.4 F (36.9 C) (Oral)    Resp 18    Ht 5\' 3"  (1.6 m)    Wt 82 lb 6.4 oz (37.4 kg)    SpO2 97%    BMI 14.60 kg/m    Subjective:    Patient ID: , female    DOB: [birth year], 76 y.o.   MRN: [id number]  HPI: Katherine Price is a 76 y.o. female  Chief Complaint  Patient presents with   Establish Care   Consult    Discuss paperwork for home placement [facility]   Establish care: She has not been to a primary care provider in many years. Daughter is with patient and does not know her history.  Patient history obtained from last er visit on 11/11/2021 and verified with patient.  She says she is not taking any medications.  Daughter says that the patient used to live with her son and they lost their home and were homeless. She says then her mom's son died suddenly.  The patient was then homeless alone. She then moved in with her and she can not care for her.  She says she has to work two jobs to take care of all the bills.  She is working on getting her placed in a living facility and needed paperwork filled out.   Anxiety     Arthritis     Chronic back pain     Chronic bronchitis (HCC)     COPD (chronic obstructive pulmonary disease) (HCC)     Coronary artery disease     Depression     Dyspnea      orthopnea   Heart murmur     History of peptic ulcer     Hyperlipidemia     Hypertension     Myocardial infarction (HCC)     Orthopnea     Pneumonia      no to pneumonia but yes to chronic bronchitis   Renal insufficiency     Wheezing    Hypertension:  She says she has had high blood pressure for years.  She says that she used to be on medication but has not been on it in a very long time.  She does not check her blood pressure at home.  Blood pressure here was 152/76 and then recheck was 130/78.  She denies any chest pain, shortness of breath, headaches or blurred vision.   Anxiety and Depression:  She says she has had  anxiety and depression off and on her whole life.  She denies any suicidal thoughts. She says she has not been on medication for along time.  Depression screen [facility] 2/9 12/25/2021  Decreased Interest 0  Down, Depressed, Hopeless 1  PHQ - 2 Score 1  Altered sleeping 0  Tired, decreased energy 1  Change in appetite 0  Feeling bad or failure about yourself  0  Trouble concentrating 0  Moving slowly or fidgety/restless 0  Suicidal thoughts 0  PHQ-9 Score 2  Difficult doing work/chores Not difficult at all    GAD 7 : Generalized Anxiety Score 12/25/2021  Nervous, Anxious, on Edge 0  Control/stop worrying 0  Worry too much - different things 0  Trouble relaxing 0  Restless 0  Easily annoyed or irritable 0  Afraid - awful might happen 0  Total GAD 7 Score 0  Anxiety Difficulty Not difficult at all  Arthritis:  She says she has had arthritis for several years.  She is not currently taking any medication and says she is fine.   Chronic low back pain:  She says her back is fine, if it hurts she might take a tylenol.  She says she does not routinely take medication for it.   COPD:  She says she still smokes and she is not going to quit.  She says she used to be on a bunch of inhalers but she is no longer on anything.  She says her breathing is fine.  CAD/MI:  She says she had a heart attack a long time ago and had open heart surgery.  She said she is never doing that again and she says she has never gone back to see the cardiologist.   Hyperlipidemia:  She says that she has high cholesterol.  She says she does not take any medication at this time.  She does not know when her cholesterol was last checked. She says it was a long time ago.  Will get labs today.   Relevant past medical, surgical, family and social history reviewed and updated as indicated. Interim medical history since our last visit reviewed. Allergies and medications reviewed and updated.  Review of  Systems  Constitutional: Negative for fever or weight change.  Respiratory: Negative for cough and shortness of breath.   Cardiovascular: Negative for chest pain or palpitations.  Gastrointestinal: Negative for abdominal pain, no bowel changes.  Musculoskeletal: Negative for gait problem or joint swelling.  Skin: Negative for rash.  Neurological: Negative for dizziness or headache.  No other specific complaints in a complete review of systems (except as listed in HPI above).      Objective:    BP 130/78 (BP Location: Right Arm)    Pulse 98    Temp 98.4 F (36.9 C) (Oral)    Resp 18    Ht 5\' 3"  (1.6 m)    Wt 82 lb 6.4 oz (37.4 kg)    SpO2 97%    BMI 14.60 kg/m   Wt Readings from Last 3 Encounters:  12/25/21 82 lb 6.4 oz (37.4 kg)  11/11/21 90 lb (40.8 kg)  10/19/19 82 lb (37.2 kg)    Physical Exam  Constitutional: Patient appears well-developed and well-nourished. No distress.  HEENT: head atraumatic, normocephalic, pupils equal and reactive to light, neck supple Cardiovascular: Normal rate, regular rhythm and normal heart sounds.  No murmur heard. No BLE edema. Pulmonary/Chest: Effort normal and breath sounds normal. No respiratory distress. Abdominal: Soft.  There is no tenderness. Psychiatric: Patient has a normal mood and affect. behavior is normal. Judgment and thought content normal.   Results for orders placed or performed during the hospital encounter of 10/16/19  SARS CORONAVIRUS 2 (TAT 6-24 HRS) Nasopharyngeal Nasopharyngeal Swab   Specimen: Nasopharyngeal Swab  Result Value Ref Range   SARS Coronavirus 2 NEGATIVE NEGATIVE      Assessment & Plan:   1. Primary hypertension  - CBC with Differential/Platelet - COMPLETE METABOLIC PANEL WITH GFR  2. Anxiety and depression  -continue to monitor 3. Arthritis -continue to monitor  4. Chronic bilateral low back pain without sciatica -continue current treatment plan  5. Encounter to establish care -get records  from any previous providers -schedule cpe  6. Screening for cholesterol level  - Lipid panel  7. Screening-pulmonary TB  - QuantiFERON-TB Gold Plus  8. Chronic obstructive pulmonary disease, unspecified COPD type (HCC) -continue to monitor  9.  Coronary artery disease involving native heart without angina pectoris, unspecified vessel or lesion type -need to get records from cardiologist  10. Myocardial infarction, unspecified MI type, unspecified artery (HCC)  -need to get records from cardiologist 11. Hyperlipidemia, unspecified hyperlipidemia type  - Lipid panel  Follow up plan: Return in about 3 months (around 03/25/2022) for cpe.

## 2022-01-01 ENCOUNTER — Other Ambulatory Visit: Payer: Self-pay | Admitting: Nurse Practitioner

## 2022-01-01 ENCOUNTER — Ambulatory Visit: Payer: Self-pay

## 2022-01-01 DIAGNOSIS — R451 Restlessness and agitation: Secondary | ICD-10-CM

## 2022-01-01 MED ORDER — QUETIAPINE FUMARATE 25 MG PO TABS: 25.0000 mg | ORAL_TABLET | Freq: Every day | ORAL | 0 refills | Status: DC
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
## 2022-01-01 NOTE — Telephone Encounter (Signed)
Pt daughter was informed.

## 2022-01-01 NOTE — Telephone Encounter (Signed)
Pt's daughter called, left VM to call office back to speak with a nurse regarding mother behavior and going to AL tomorrow.  Summary: Requesting medication.   Pt daughter stated pt is going to an assisted living facility tomorrow and they have asked she contact PCP to be provided with medication to calm her down.   Pt daughter stated she is being verbally abusive and threatening people.   Seeking clinical advice.

## 2022-01-01 NOTE — Telephone Encounter (Signed)
Spoke to daughter and mother do not want to go into assistance living tomorrow. She is cursing, acting  out, verbally abusive, vocal and angry. Care home will like to know if you can prescribe something for her. Will be placed tomorrow.   [facility] [employer]

## 2022-01-05 ENCOUNTER — Telehealth: Payer: Self-pay

## 2022-01-05 NOTE — Telephone Encounter (Signed)
Copied from CRM [id number]. Topic: General - Other >> Jan 05, 2022  4:04 PM Gaetana Michaelis A wrote: Reason for CRM: The patient's daughter has called requesting for the patient to be placed on a medication to assist with their transition into a residential facility  The patient's daughter would like to know if a medication be prescribed for the patent without needing an additional office appt  Please contact further when possible

## 2022-01-06 ENCOUNTER — Other Ambulatory Visit: Payer: Self-pay | Admitting: Nurse Practitioner

## 2022-01-06 DIAGNOSIS — F419 Anxiety disorder, unspecified: Secondary | ICD-10-CM

## 2022-01-06 DIAGNOSIS — R451 Restlessness and agitation: Secondary | ICD-10-CM

## 2022-01-06 DIAGNOSIS — F32A Depression, unspecified: Secondary | ICD-10-CM

## 2022-01-06 MED ORDER — HYDROXYZINE PAMOATE 25 MG PO CAPS
25.0000 mg | ORAL_CAPSULE | Freq: Four times a day (QID) | ORAL | 1 refills | Status: DC | PRN
Start: 2022-01-06 — End: 2022-01-15

## 2022-01-06 NOTE — Telephone Encounter (Signed)
Are you able to do a virtual appointment for this person

## 2022-01-06 NOTE — Telephone Encounter (Signed)
Patient daughter Katherine Price notified

## 2022-01-06 NOTE — Telephone Encounter (Signed)
They  are giving her the seroquel at night but she need something though the day also. If she get put out this home, she will have no where to go

## 2022-01-15 ENCOUNTER — Other Ambulatory Visit: Payer: Self-pay | Admitting: Emergency Medicine

## 2022-01-15 DIAGNOSIS — R451 Restlessness and agitation: Secondary | ICD-10-CM

## 2022-01-15 DIAGNOSIS — F32A Depression, unspecified: Secondary | ICD-10-CM

## 2022-01-15 DIAGNOSIS — F419 Anxiety disorder, unspecified: Secondary | ICD-10-CM

## 2022-01-15 MED ORDER — HYDROXYZINE PAMOATE 25 MG PO CAPS: 25.0000 mg | ORAL_CAPSULE | Freq: Four times a day (QID) | ORAL | 1 refills | Status: DC | PRN

## 2022-01-27 ENCOUNTER — Other Ambulatory Visit: Payer: Self-pay | Admitting: Nurse Practitioner

## 2022-01-27 DIAGNOSIS — R451 Restlessness and agitation: Secondary | ICD-10-CM

## 2022-01-28 ENCOUNTER — Other Ambulatory Visit: Payer: Self-pay

## 2022-01-28 DIAGNOSIS — R451 Restlessness and agitation: Secondary | ICD-10-CM

## 2022-01-28 NOTE — Telephone Encounter (Signed)
Requested medication (s) are due for refill today - yes  Requested medication (s) are on the active medication list -yes  Future visit scheduled -no  Last refill: 01/01/22 #30  Notes to clinic: Request RF: new pharnacy, non delegated Rx, fails lab protocol  Requested Prescriptions  Pending Prescriptions Disp Refills   QUEtiapine (SEROQUEL) 25 MG tablet [Pharmacy Med Name: QUETIAPINE FUMARATE 25 MG TAB] 30 tablet 0    Sig: TAKE 1 TABLET BY MOUTH AT BEDTIME     Not Delegated - Psychiatry:  Antipsychotics - Second Generation (Atypical) - quetiapine Failed - 01/27/2022  3:44 PM      Failed - This refill cannot be delegated      Failed - TSH in normal range and within 360 days    No results found for: TSH, POCTSH, TSHREFLEX        Failed - Lipid Panel in normal range within the last 12 months    Cholesterol  Date Value Ref Range Status  12/25/2021 266 (H) <200 mg/dL Final   LDL Cholesterol (Calc)  Date Value Ref Range Status  12/25/2021 161 (H) mg/dL (calc) Final    Comment:    Reference range: <100 . Desirable range <100 mg/dL for primary prevention;   <70 mg/dL for patients with CHD or diabetic patients  with > or = 2 CHD risk factors. Marland Kitchen LDL-C is now calculated using the Martin-Hopkins  calculation, which is a validated novel method providing  better accuracy than the Friedewald equation in the  estimation of LDL-C.  Cresenciano Genre et al. Annamaria Helling. 5956;387(56): 2061-2068  (http://education.[facility].com/faq/FAQ164)    HDL  Date Value Ref Range Status  12/25/2021 77 > OR = 50 mg/dL Final   Triglycerides  Date Value Ref Range Status  12/25/2021 150 (H) <150 mg/dL Final         Failed - CMP within normal limits and completed in the last 12 months    Albumin  Date Value Ref Range Status  04/14/2018 4.5 3.5 - 5.0 g/dL Final   Alkaline Phosphatase  Date Value Ref Range Status  04/14/2018 79 38 - 126 U/L Final   Alkaline phosphatase (APISO)  Date Value Ref Range  Status  12/25/2021 89 37 - 153 U/L Final   ALT  Date Value Ref Range Status  12/25/2021 10 6 - 29 U/L Final   AST  Date Value Ref Range Status  12/25/2021 13 10 - 35 U/L Final   BUN  Date Value Ref Range Status  12/25/2021 9 7 - 25 mg/dL Final   Calcium  Date Value Ref Range Status  12/25/2021 9.6 8.6 - 10.4 mg/dL Final   CO2  Date Value Ref Range Status  12/25/2021 31 20 - 32 mmol/L Final   Creat  Date Value Ref Range Status  12/25/2021 0.91 0.60 - 1.00 mg/dL Final   Glucose, Bld  Date Value Ref Range Status  12/25/2021 90 65 - 99 mg/dL Final    Comment:    .            Fasting reference interval .    Potassium  Date Value Ref Range Status  12/25/2021 4.5 3.5 - 5.3 mmol/L Final   Sodium  Date Value Ref Range Status  12/25/2021 138 135 - 146 mmol/L Final   Total Bilirubin  Date Value Ref Range Status  12/25/2021 0.3 0.2 - 1.2 mg/dL Final   Total Protein  Date Value Ref Range Status  12/25/2021 6.6 6.1 - 8.1 g/dL Final  GFR calc Af Amer  Date Value Ref Range Status  08/06/2019 >60 >60 mL/min Final   eGFR  Date Value Ref Range Status  12/25/2021 65 > OR = 60 mL/min/1.105m Final    Comment:    The eGFR is based on the CKD-EPI 2021 equation. To calculate  the new eGFR from a previous Creatinine or Cystatin C result, go to https://www.kidney.org/professionals/ kdoqi/gfr%5Fcalculator    GFR calc non Af Amer  Date Value Ref Range Status  08/06/2019 >60 >60 mL/min Final         Passed - Last BP in normal range    BP Readings from Last 1 Encounters:  12/25/21 130/78          Passed - Last Heart Rate in normal range    Pulse Readings from Last 1 Encounters:  12/25/21 98          Passed - Valid encounter within last 6 months    Recent Outpatient Visits           1 month ago [facility]              Passed - CBC within normal limits and completed in the last 12 months     WBC  Date Value Ref Range Status  12/25/2021 7.2 3.8 - 10.8 Thousand/uL Final   RBC  Date Value Ref Range Status  12/25/2021 4.23 3.80 - 5.10 Million/uL Final   Hemoglobin  Date Value Ref Range Status  12/25/2021 12.8 11.7 - 15.5 g/dL Final   HCT  Date Value Ref Range Status  12/25/2021 37.8 35.0 - 45.0 % Final   MCHC  Date Value Ref Range Status  12/25/2021 33.9 32.0 - 36.0 g/dL Final   [facility] Date Value Ref Range Status  12/25/2021 30.3 27.0 - 33.0 pg Final   MCV  Date Value Ref Range Status  12/25/2021 89.4 80.0 - 100.0 fL Final   No results found for: PLTCOUNTKUC, LABPLAT, POCPLA RDW  Date Value Ref Range Status  12/25/2021 13.8 11.0 - 15.0 % Final            Requested Prescriptions  Pending Prescriptions Disp Refills   QUEtiapine (SEROQUEL) 25 MG tablet [Pharmacy Med Name: QUETIAPINE FUMARATE 25 MG TAB] 30 tablet 0    Sig: TAKE 1 TABLET BY MOUTH AT BEDTIME     Not Delegated - Psychiatry:  Antipsychotics - Second Generation (Atypical) - quetiapine Failed - 01/27/2022  3:44 PM      Failed - This refill cannot be delegated      Failed - TSH in normal range and within 360 days    No results found for: TSH, POCTSH, TSHREFLEX        Failed - Lipid Panel in normal range within the last 12 months    Cholesterol  Date Value Ref Range Status  12/25/2021 266 (H) <200 mg/dL Final   LDL Cholesterol (Calc)  Date Value Ref Range Status  12/25/2021 161 (H) mg/dL (calc) Final    Comment:    Reference range: <100 . Desirable range <100 mg/dL for primary prevention;   <70 mg/dL for patients with CHD or diabetic patients  with > or = 2 CHD risk factors. .Marland KitchenLDL-C is now calculated using the Martin-Hopkins  calculation, which is a validated novel method providing  better accuracy than the Friedewald equation in the  estimation of LDL-C.  MCresenciano Genreet al. JAnnamaria Helling 21155;208(02: [phone] (  http://education.[facility].com/faq/FAQ164)    HDL  Date Value Ref  Range Status  12/25/2021 77 > OR = 50 mg/dL Final   Triglycerides  Date Value Ref Range Status  12/25/2021 150 (H) <150 mg/dL Final         Failed - CMP within normal limits and completed in the last 12 months    Albumin  Date Value Ref Range Status  04/14/2018 4.5 3.5 - 5.0 g/dL Final   Alkaline Phosphatase  Date Value Ref Range Status  04/14/2018 79 38 - 126 U/L Final   Alkaline phosphatase (APISO)  Date Value Ref Range Status  12/25/2021 89 37 - 153 U/L Final   ALT  Date Value Ref Range Status  12/25/2021 10 6 - 29 U/L Final   AST  Date Value Ref Range Status  12/25/2021 13 10 - 35 U/L Final   BUN  Date Value Ref Range Status  12/25/2021 9 7 - 25 mg/dL Final   Calcium  Date Value Ref Range Status  12/25/2021 9.6 8.6 - 10.4 mg/dL Final   CO2  Date Value Ref Range Status  12/25/2021 31 20 - 32 mmol/L Final   Creat  Date Value Ref Range Status  12/25/2021 0.91 0.60 - 1.00 mg/dL Final   Glucose, Bld  Date Value Ref Range Status  12/25/2021 90 65 - 99 mg/dL Final    Comment:    .            Fasting reference interval .    Potassium  Date Value Ref Range Status  12/25/2021 4.5 3.5 - 5.3 mmol/L Final   Sodium  Date Value Ref Range Status  12/25/2021 138 135 - 146 mmol/L Final   Total Bilirubin  Date Value Ref Range Status  12/25/2021 0.3 0.2 - 1.2 mg/dL Final   Total Protein  Date Value Ref Range Status  12/25/2021 6.6 6.1 - 8.1 g/dL Final   GFR calc Af Amer  Date Value Ref Range Status  08/06/2019 >60 >60 mL/min Final   eGFR  Date Value Ref Range Status  12/25/2021 65 > OR = 60 mL/min/1.40m Final    Comment:    The eGFR is based on the CKD-EPI 2021 equation. To calculate  the new eGFR from a previous Creatinine or Cystatin C result, go to https://www.kidney.org/professionals/ kdoqi/gfr%5Fcalculator    GFR calc non Af Amer  Date Value Ref Range Status  08/06/2019 >60 >60 mL/min Final         Passed - Last BP in normal range     BP Readings from Last 1 Encounters:  12/25/21 130/78          Passed - Last Heart Rate in normal range    Pulse Readings from Last 1 Encounters:  12/25/21 98          Passed - Valid encounter within last 6 months    Recent Outpatient Visits           1 month ago [facility]   [facility]              Passed - CBC within normal limits and completed in the last 12 months    WBC  Date Value Ref Range Status  12/25/2021 7.2 3.8 - 10.8 Thousand/uL Final   RBC  Date Value Ref Range Status  12/25/2021 4.23 3.80 - 5.10 Million/uL Final   Hemoglobin  Date Value Ref Range Status  12/25/2021 12.8 11.7 - 15.5 g/dL Final  HCT  Date Value Ref Range Status  12/25/2021 37.8 35.0 - 45.0 % Final   MCHC  Date Value Ref Range Status  12/25/2021 33.9 32.0 - 36.0 g/dL Final   [facility]  Date Value Ref Range Status  12/25/2021 30.3 27.0 - 33.0 pg Final   MCV  Date Value Ref Range Status  12/25/2021 89.4 80.0 - 100.0 fL Final   No results found for: PLTCOUNTKUC, LABPLAT, POCPLA RDW  Date Value Ref Range Status  12/25/2021 13.8 11.0 - 15.0 % Final

## 2022-01-29 ENCOUNTER — Other Ambulatory Visit: Payer: Self-pay | Admitting: Nurse Practitioner

## 2022-01-29 DIAGNOSIS — F419 Anxiety disorder, unspecified: Secondary | ICD-10-CM

## 2022-01-29 DIAGNOSIS — F32A Depression, unspecified: Secondary | ICD-10-CM

## 2022-01-29 DIAGNOSIS — R451 Restlessness and agitation: Secondary | ICD-10-CM

## 2022-01-29 DIAGNOSIS — F17219 Nicotine dependence, cigarettes, with unspecified nicotine-induced disorders: Secondary | ICD-10-CM

## 2022-01-29 MED ORDER — QUETIAPINE FUMARATE 25 MG PO TABS: 25.0000 mg | ORAL_TABLET | Freq: Every day | ORAL | 0 refills | Status: DC

## 2022-01-29 MED ORDER — HYDROXYZINE PAMOATE 25 MG PO CAPS: 25.0000 mg | ORAL_CAPSULE | Freq: Four times a day (QID) | ORAL | 1 refills | Status: DC | PRN

## 2022-01-29 MED ORDER — NICOTINE 21 MG/24HR TD PT24: 21.0000 mg | MEDICATED_PATCH | Freq: Every day | TRANSDERMAL | 1 refills | Status: DC

## 2022-02-12 ENCOUNTER — Other Ambulatory Visit: Payer: Self-pay

## 2022-02-12 ENCOUNTER — Ambulatory Visit: Payer: Medicare (Managed Care) | Admitting: Nurse Practitioner

## 2022-02-12 ENCOUNTER — Encounter: Payer: Self-pay | Admitting: Nurse Practitioner

## 2022-02-12 VITALS — BP 118/82 | HR 97 | Temp 98.0°F | Resp 16 | Wt 87.4 lb

## 2022-02-12 DIAGNOSIS — M545 Low back pain, unspecified: Secondary | ICD-10-CM | POA: Diagnosis not present

## 2022-02-12 DIAGNOSIS — G8929 Other chronic pain: Secondary | ICD-10-CM | POA: Diagnosis not present

## 2022-02-12 DIAGNOSIS — R451 Restlessness and agitation: Secondary | ICD-10-CM | POA: Diagnosis not present

## 2022-02-12 DIAGNOSIS — F419 Anxiety disorder, unspecified: Secondary | ICD-10-CM | POA: Diagnosis not present

## 2022-02-12 MED ORDER — ACETAMINOPHEN 325 MG PO TABS: 650.0000 mg | ORAL_TABLET | Freq: Four times a day (QID) | ORAL | 1 refills | Status: DC | PRN

## 2022-02-12 MED ORDER — METHOCARBAMOL 500 MG PO TABS: 500.0000 mg | ORAL_TABLET | Freq: Two times a day (BID) | ORAL | 0 refills | Status: DC | PRN

## 2022-02-12 NOTE — Progress Notes (Signed)
? ?BP 118/82   Pulse 97   Temp 98 ?F (36.7 ?C)   Resp 16   Wt 87 lb 6.4 oz (39.6 kg)   SpO2 98%   BMI 15.48 kg/m?   ? ?Subjective:  ? ? Patient ID: Katherine Price, female    DOB: 1946/09/07, 76 y.o.   MRN: [id number] ? ?HPI: ?Katherine Price is a 76 y.o. female ? ?Chief Complaint  ?Patient presents with  ? Back Pain  ? ?Chronic back pain: She says that her back pain has gotten a little worse lately.  She denies any recent injury.  She says that she usually just takes tylenol when her back pain flares up but since she is now in a group home if it is not on her med sheet she cannot have it.  She has no decrease ROM.  She denies any fever or urinary complaints.  Will send in prescription for tylenol and robaxin for PRN use.   ? ?Behavior:  She is getting evicted from her group home due to her behavior.  So in thirty days she will have no where to live and will be back on the streets.  Will place referral to psychiatry to help with behavior and referral to social work to hopefully find a place to live. Had a long discussion about not cussing and hitting other people in the group home. ?Depression screen [facility] 2/9 02/12/2022 12/25/2021  ?Decreased Interest 0 0  ?Down, Depressed, Hopeless 0 1  ?PHQ - 2 Score 0 1  ?Altered sleeping 0 0  ?Tired, decreased energy 0 1  ?Change in appetite 0 0  ?Feeling bad or failure about yourself  0 0  ?Trouble concentrating 0 0  ?Moving slowly or fidgety/restless 0 0  ?Suicidal thoughts 0 0  ?PHQ-9 Score 0 2  ?Difficult doing work/chores Not difficult at all Not difficult at all  ?  ?GAD 7 : Generalized Anxiety Score 12/25/2021  ?Nervous, Anxious, on Edge 0  ?Control/stop worrying 0  ?Worry too much - different things 0  ?Trouble relaxing 0  ?Restless 0  ?Easily annoyed or irritable 0  ?Afraid - awful might happen 0  ?Total GAD 7 Score 0  ?Anxiety Difficulty Not difficult at all  ? ?  ?Relevant past medical, surgical, family and social history reviewed and updated as indicated. Interim  medical history since our last visit reviewed. ?Allergies and medications reviewed and updated. ? ?Review of Systems ? ?Constitutional: Negative for fever or weight change.  ?Respiratory: Negative for cough and shortness of breath.   ?Cardiovascular: Negative for chest pain or palpitations.  ?Gastrointestinal: Negative for abdominal pain, no bowel changes.  ?Musculoskeletal: Negative for gait problem or joint swelling. Positive for back pain ?Skin: Negative for rash.  ?Neurological: Negative for dizziness or headache.  ?No other specific complaints in a complete review of systems (except as listed in HPI above).  ? ?   ?Objective:  ?  ?BP 118/82   Pulse 97   Temp 98 ?F (36.7 ?C)   Resp 16   Wt 87 lb 6.4 oz (39.6 kg)   SpO2 98%   BMI 15.48 kg/m?   ?Wt Readings from Last 3 Encounters:  ?02/12/22 87 lb 6.4 oz (39.6 kg)  ?12/25/21 82 lb 6.4 oz (37.4 kg)  ?11/11/21 90 lb (40.8 kg)  ?  ?Physical Exam ? ?Constitutional: Patient appears well-developed and well-nourished. No distress.  ?HEENT: head atraumatic, normocephalic, pupils equal and reactive to light, neck supple ?Cardiovascular: Normal  rate, regular rhythm and normal heart sounds.  No murmur heard. No BLE edema. ?Pulmonary/Chest: Effort normal and breath sounds normal. No respiratory distress. ?Abdominal: Soft.  There is no tenderness. ?Psychiatric: Patient has a normal mood and affect. behavior is normal. Judgment and thought content normal.  ? ?Results for orders placed or performed in visit on 12/25/21  ?Lipid panel  ?Result Value Ref Range  ? Cholesterol 266 (H) <200 mg/dL  ? HDL 77 > OR = 50 mg/dL  ? Triglycerides 150 (H) <150 mg/dL  ? LDL Cholesterol (Calc) 161 (H) mg/dL (calc)  ? Total CHOL/HDL Ratio 3.5 <5.0 (calc)  ? Non-HDL Cholesterol (Calc) 189 (H) <130 mg/dL (calc)  ?CBC with Differential/Platelet  ?Result Value Ref Range  ? WBC 7.2 3.8 - 10.8 Thousand/uL  ? RBC 4.23 3.80 - 5.10 Million/uL  ? Hemoglobin 12.8 11.7 - 15.5 g/dL  ? HCT 37.8 35.0 -  45.0 %  ? MCV 89.4 80.0 - 100.0 fL  ? MCH 30.3 27.0 - 33.0 pg  ? MCHC 33.9 32.0 - 36.0 g/dL  ? RDW 13.8 11.0 - 15.0 %  ? Platelets 389 140 - 400 Thousand/uL  ? MPV 10.1 7.5 - 12.5 fL  ? Neutro Abs 4,982 1,500 - 7,800 cells/uL  ? Lymphs Abs 1,584 850 - 3,900 cells/uL  ? Absolute Monocytes 389 200 - 950 cells/uL  ? Eosinophils Absolute 158 15 - 500 cells/uL  ? Basophils Absolute 86 0 - 200 cells/uL  ? Neutrophils Relative % 69.2 %  ? Total Lymphocyte 22.0 %  ? Monocytes Relative 5.4 %  ? Eosinophils Relative 2.2 %  ? Basophils Relative 1.2 %  ?COMPLETE METABOLIC PANEL WITH GFR  ?Result Value Ref Range  ? Glucose, Bld 90 65 - 99 mg/dL  ? BUN 9 7 - 25 mg/dL  ? Creat 0.91 0.60 - 1.00 mg/dL  ? eGFR 65 > OR = 60 mL/min/1.42m  ? BUN/Creatinine Ratio NOT APPLICABLE 6 - 22 (calc)  ? Sodium 138 135 - 146 mmol/L  ? Potassium 4.5 3.5 - 5.3 mmol/L  ? Chloride 101 98 - 110 mmol/L  ? CO2 31 20 - 32 mmol/L  ? Calcium 9.6 8.6 - 10.4 mg/dL  ? Total Protein 6.6 6.1 - 8.1 g/dL  ? Albumin 4.3 3.6 - 5.1 g/dL  ? Globulin 2.3 1.9 - 3.7 g/dL (calc)  ? AG Ratio 1.9 1.0 - 2.5 (calc)  ? Total Bilirubin 0.3 0.2 - 1.2 mg/dL  ? Alkaline phosphatase (APISO) 89 37 - 153 U/L  ? AST 13 10 - 35 U/L  ? ALT 10 6 - 29 U/L  ? ?   ?Assessment & Plan:  ? ?1. Chronic bilateral low back pain without sciatica ? ?- acetaminophen (TYLENOL) 325 MG tablet; Take 2 tablets (650 mg total) by mouth every 6 (six) hours as needed for mild pain.  Dispense: 90 tablet; Refill: 1 ?- methocarbamol (ROBAXIN) 500 MG tablet; Take 1 tablet (500 mg total) by mouth 2 (two) times daily as needed for muscle spasms.  Dispense: 20 tablet; Refill: 0 ? ?2. Agitation ? ?- Ambulatory referral to Psychiatry ?- Ambulatory referral to Social Work ? ?3. Anxiety and depression ? ?- Ambulatory referral to Psychiatry ?- Ambulatory referral to Social Work  ? ?Follow up plan: ?Return if symptoms worsen or fail to improve. ? ? ? ? ? ?

## 2022-02-24 ENCOUNTER — Other Ambulatory Visit: Payer: Self-pay | Admitting: Nurse Practitioner

## 2022-02-24 DIAGNOSIS — F419 Anxiety disorder, unspecified: Secondary | ICD-10-CM

## 2022-02-24 DIAGNOSIS — R451 Restlessness and agitation: Secondary | ICD-10-CM

## 2022-02-26 ENCOUNTER — Other Ambulatory Visit: Payer: Self-pay | Admitting: Nurse Practitioner

## 2022-02-26 DIAGNOSIS — R451 Restlessness and agitation: Secondary | ICD-10-CM

## 2022-02-27 NOTE — Telephone Encounter (Signed)
Requested medications are due for refill today.  yes ? ?Requested medications are on the active medications list.  yes ? ?Last refill. 01/29/2022 #30 0 refills ? ?Future visit scheduled.   no ? ?Notes to clinic.  Medication refill is not delegated. ? ? ? ?Requested Prescriptions  ?Pending Prescriptions Disp Refills  ? QUEtiapine (SEROQUEL) 25 MG tablet [Pharmacy Med Name: QUETIAPINE FUMARATE 25 MG TAB] 30 tablet 0  ?  Sig: TAKE 1 TABLET BY MOUTH AT BEDTIME  ?  ? Not Delegated - Psychiatry:  Antipsychotics - Second Generation (Atypical) - quetiapine Failed - 02/26/2022 12:00 PM  ?  ?  Failed - This refill cannot be delegated  ?  ?  Failed - TSH in normal range and within 360 days  ?  No results found for: TSH, POCTSH, TSHREFLEX  ?  ?  ?  Failed - Lipid Panel in normal range within the last 12 months  ?  Cholesterol  ?Date Value Ref Range Status  ?12/25/2021 266 (H) <200 mg/dL Final  ? ?LDL Cholesterol (Calc)  ?Date Value Ref Range Status  ?12/25/2021 161 (H) mg/dL (calc) Final  ?  Comment:  ?  Reference range: <100 ?Marland Kitchen ?Desirable range <100 mg/dL for primary prevention;   ?<70 mg/dL for patients with CHD or diabetic patients  ?with > or = 2 CHD risk factors. ?. ?LDL-C is now calculated using the Martin-Hopkins  ?calculation, which is a validated novel method providing  ?better accuracy than the Friedewald equation in the  ?estimation of LDL-C.  ?Cresenciano Genre et al. Annamaria Helling. 2595;638(75): 2061-2068  ?(http://education.[facility].com/faq/FAQ164) ?  ? ?HDL  ?Date Value Ref Range Status  ?12/25/2021 77 > OR = 50 mg/dL Final  ? ?Triglycerides  ?Date Value Ref Range Status  ?12/25/2021 150 (H) <150 mg/dL Final  ? ?  ?  ?  Failed - CMP within normal limits and completed in the last 12 months  ?  Albumin  ?Date Value Ref Range Status  ?04/14/2018 4.5 3.5 - 5.0 g/dL Final  ? ?Alkaline Phosphatase  ?Date Value Ref Range Status  ?04/14/2018 79 38 - 126 U/L Final  ? ?Alkaline phosphatase (APISO)  ?Date Value Ref Range Status   ?12/25/2021 89 37 - 153 U/L Final  ? ?ALT  ?Date Value Ref Range Status  ?12/25/2021 10 6 - 29 U/L Final  ? ?AST  ?Date Value Ref Range Status  ?12/25/2021 13 10 - 35 U/L Final  ? ?BUN  ?Date Value Ref Range Status  ?12/25/2021 9 7 - 25 mg/dL Final  ? ?Calcium  ?Date Value Ref Range Status  ?12/25/2021 9.6 8.6 - 10.4 mg/dL Final  ? ?CO2  ?Date Value Ref Range Status  ?12/25/2021 31 20 - 32 mmol/L Final  ? ?Creat  ?Date Value Ref Range Status  ?12/25/2021 0.91 0.60 - 1.00 mg/dL Final  ? ?Glucose, Bld  ?Date Value Ref Range Status  ?12/25/2021 90 65 - 99 mg/dL Final  ?  Comment:  ?  . ?           Fasting reference interval ?. ?  ? ?Potassium  ?Date Value Ref Range Status  ?12/25/2021 4.5 3.5 - 5.3 mmol/L Final  ? ?Sodium  ?Date Value Ref Range Status  ?12/25/2021 138 135 - 146 mmol/L Final  ? ?Total Bilirubin  ?Date Value Ref Range Status  ?12/25/2021 0.3 0.2 - 1.2 mg/dL Final  ? ?Total Protein  ?Date Value Ref Range Status  ?12/25/2021 6.6 6.1 - 8.1 g/dL Final  ? ?  GFR calc Af Amer  ?Date Value Ref Range Status  ?08/06/2019 >60 >60 mL/min Final  ? ?eGFR  ?Date Value Ref Range Status  ?12/25/2021 65 > OR = 60 mL/min/1.20m Final  ?  Comment:  ?  The eGFR is based on the CKD-EPI 2021 equation. To calculate  ?the new eGFR from a previous Creatinine or Cystatin C ?result, go to https://www.kidney.org/professionals/ ?kdoqi/gfr%5Fcalculator ?  ? ?GFR calc non Af Amer  ?Date Value Ref Range Status  ?08/06/2019 >60 >60 mL/min Final  ? ?  ?  ?  Passed - Last BP in normal range  ?  BP Readings from Last 1 Encounters:  ?02/12/22 118/82  ?  ?  ?  ?  Passed - Last Heart Rate in normal range  ?  Pulse Readings from Last 1 Encounters:  ?02/12/22 97  ?  ?  ?  ?  Passed - Valid encounter within last 6 months  ?  Recent Outpatient Visits   ? ?      ? 2 weeks ago Chronic bilateral low back pain without sciatica  ? [facility]  ? 2 months [facility]  [facility]  ? ?  ?  ? ?  ?  ?  Passed - CBC within normal limits and completed in the last 12 months  ?  WBC  ?Date Value Ref Range Status  ?12/25/2021 7.2 3.8 - 10.8 Thousand/uL Final  ? ?RBC  ?Date Value Ref Range Status  ?12/25/2021 4.23 3.80 - 5.10 Million/uL Final  ? ?Hemoglobin  ?Date Value Ref Range Status  ?12/25/2021 12.8 11.7 - 15.5 g/dL Final  ? ?HCT  ?Date Value Ref Range Status  ?12/25/2021 37.8 35.0 - 45.0 % Final  ? ?MCHC  ?Date Value Ref Range Status  ?12/25/2021 33.9 32.0 - 36.0 g/dL Final  ? ?MCH  ?Date Value Ref Range Status  ?12/25/2021 30.3 27.0 - 33.0 pg Final  ? ?MCV  ?Date Value Ref Range Status  ?12/25/2021 89.4 80.0 - 100.0 fL Final  ? ?No results found for: PLTCOUNTKUC, LABPLAT, PMarion?RDW  ?Date Value Ref Range Status  ?12/25/2021 13.8 11.0 - 15.0 % Final  ? ?  ?  ?  ?  ?

## 2022-03-20 ENCOUNTER — Other Ambulatory Visit: Payer: Self-pay | Admitting: Nurse Practitioner

## 2022-03-20 ENCOUNTER — Telehealth: Payer: Self-pay | Admitting: Nurse Practitioner

## 2022-03-20 NOTE — Telephone Encounter (Signed)
[facility] called and they spoke with care giver at pts facility and the pt is refusing the nicotine (NICODERM CQ) 21 mg/24hr patch / she also no longer has the sensation to smoke / so [facility] would like a DC order for this RX ?

## 2022-03-20 NOTE — Telephone Encounter (Signed)
Request to discontinue nicotine patch- patient reports she no longer needs it ?

## 2022-05-27 ENCOUNTER — Other Ambulatory Visit: Payer: Self-pay | Admitting: Nurse Practitioner

## 2022-05-27 DIAGNOSIS — R451 Restlessness and agitation: Secondary | ICD-10-CM

## 2022-05-27 NOTE — Telephone Encounter (Signed)
Requested medication (s) are due for refill today:   Provider to review  Requested medication (s) are on the active medication list:   Yes  Future visit scheduled:   No   Last ordered: 02/27/2022 #30, 3 refills  Returned because it's a non delegated refill   Requested Prescriptions  Pending Prescriptions Disp Refills   QUEtiapine (SEROQUEL) 25 MG tablet [Pharmacy Med Name: QUETIAPINE FUMARATE 25 MG TAB] 30 tablet 3    Sig: TAKE 1 TABLET BY MOUTH AT BEDTIME     Not Delegated - Psychiatry:  Antipsychotics - Second Generation (Atypical) - quetiapine Failed - 05/27/2022 10:20 AM      Failed - This refill cannot be delegated      Failed - TSH in normal range and within 360 days    No results found for: "TSH", "[place]", "[place]"       Failed - Lipid Panel in normal range within the last 12 months    Cholesterol  Date Value Ref Range Status  12/25/2021 266 (H) <200 mg/dL Final   LDL Cholesterol (Calc)  Date Value Ref Range Status  12/25/2021 161 (H) mg/dL (calc) Final    Comment:    Reference range: <100 . Desirable range <100 mg/dL for primary prevention;   <70 mg/dL for patients with CHD or diabetic patients  with > or = 2 CHD risk factors. Marland Kitchen LDL-C is now calculated using the Martin-Hopkins  calculation, which is a validated novel method providing  better accuracy than the Friedewald equation in the  estimation of LDL-C.  Cresenciano Genre et al. Annamaria Helling. 0263;785(88): 2061-2068  (http://education.[facility].com/faq/FAQ164)    HDL  Date Value Ref Range Status  12/25/2021 77 > OR = 50 mg/dL Final   Triglycerides  Date Value Ref Range Status  12/25/2021 150 (H) <150 mg/dL Final         Failed - CMP within normal limits and completed in the last 12 months    Albumin  Date Value Ref Range Status  04/14/2018 4.5 3.5 - 5.0 g/dL Final   Alkaline Phosphatase  Date Value Ref Range Status  04/14/2018 79 38 - 126 U/L Final   Alkaline phosphatase (APISO)  Date Value Ref  Range Status  12/25/2021 89 37 - 153 U/L Final   ALT  Date Value Ref Range Status  12/25/2021 10 6 - 29 U/L Final   AST  Date Value Ref Range Status  12/25/2021 13 10 - 35 U/L Final   BUN  Date Value Ref Range Status  12/25/2021 9 7 - 25 mg/dL Final   Calcium  Date Value Ref Range Status  12/25/2021 9.6 8.6 - 10.4 mg/dL Final   CO2  Date Value Ref Range Status  12/25/2021 31 20 - 32 mmol/L Final   Creat  Date Value Ref Range Status  12/25/2021 0.91 0.60 - 1.00 mg/dL Final   Glucose, Bld  Date Value Ref Range Status  12/25/2021 90 65 - 99 mg/dL Final    Comment:    .            Fasting reference interval .    Potassium  Date Value Ref Range Status  12/25/2021 4.5 3.5 - 5.3 mmol/L Final   Sodium  Date Value Ref Range Status  12/25/2021 138 135 - 146 mmol/L Final   Total Bilirubin  Date Value Ref Range Status  12/25/2021 0.3 0.2 - 1.2 mg/dL Final   Total Protein  Date Value Ref Range Status  12/25/2021 6.6 6.1 - 8.1  g/dL Final   GFR calc Af Amer  Date Value Ref Range Status  08/06/2019 >60 >60 mL/min Final   eGFR  Date Value Ref Range Status  12/25/2021 65 > OR = 60 mL/min/1.55m Final    Comment:    The eGFR is based on the CKD-EPI 2021 equation. To calculate  the new eGFR from a previous Creatinine or Cystatin C result, go to https://www.kidney.org/professionals/ kdoqi/gfr%5Fcalculator    GFR calc non Af Amer  Date Value Ref Range Status  08/06/2019 >60 >60 mL/min Final         Passed - Last BP in normal range    BP Readings from Last 1 Encounters:  02/12/22 118/82         Passed - Last Heart Rate in normal range    Pulse Readings from Last 1 Encounters:  02/12/22 97         Passed - Valid encounter within last 6 months    Recent Outpatient Visits           3 months [facility]   5 [facility]              Passed - CBC within normal limits and completed in the last 12 months    WBC  Date Value Ref Range Status  12/25/2021 7.2 3.8 - 10.8 Thousand/uL Final   RBC  Date Value Ref Range Status  12/25/2021 4.23 3.80 - 5.10 Million/uL Final   Hemoglobin  Date Value Ref Range Status  12/25/2021 12.8 11.7 - 15.5 g/dL Final   HCT  Date Value Ref Range Status  12/25/2021 37.8 35.0 - 45.0 % Final   MCHC  Date Value Ref Range Status  12/25/2021 33.9 32.0 - 36.0 g/dL Final   [facility] Date Value Ref Range Status  12/25/2021 30.3 27.0 - 33.0 pg Final   MCV  Date Value Ref Range Status  12/25/2021 89.4 80.0 - 100.0 fL Final   No results found for: "PLTCOUNTKUC", "LABPLAT", "POCPLA" RDW  Date Value Ref Range Status  12/25/2021 13.8 11.0 - 15.0 % Final

## 2022-07-02 ENCOUNTER — Telehealth: Payer: Self-pay | Admitting: Nurse Practitioner

## 2022-07-02 NOTE — Telephone Encounter (Signed)
[facility] needs a discontinue order on the nicotine patch / please advise

## 2022-07-02 NOTE — Telephone Encounter (Signed)
Printed and faxed this paperwork again

## 2022-09-24 ENCOUNTER — Other Ambulatory Visit: Payer: Self-pay | Admitting: Nurse Practitioner

## 2022-09-24 DIAGNOSIS — R451 Restlessness and agitation: Secondary | ICD-10-CM

## 2022-09-25 NOTE — Telephone Encounter (Signed)
Requested medication (s) are due for refill today: yes  Requested medication (s) are on the active medication list: yes  Last refill:  05/28/22 #30 3 RF  Future visit scheduled: no  Notes to clinic:  med not delegated to NT to reorder   Requested Prescriptions  Pending Prescriptions Disp Refills   QUEtiapine (SEROQUEL) 25 MG tablet [Pharmacy Med Name: QUETIAPINE FUMARATE 25 MG TAB] 30 tablet 3    Sig: TAKE 1 TABLET BY MOUTH AT BEDTIME     Not Delegated - Psychiatry:  Antipsychotics - Second Generation (Atypical) - quetiapine Failed - 09/24/2022  4:48 PM      Failed - This refill cannot be delegated      Failed - TSH in normal range and within 360 days    No results found for: "TSH", "[place]", "[place]"       Failed - Valid encounter within last 6 months    Recent Outpatient Visits           7 months ago Chronic bilateral low back pain without sciatica   [facility] FNP   9 months ago Primary hypertension   [facility], Almyra Free F, FNP              Failed - Lipid Panel in normal range within the last 12 months    Cholesterol  Date Value Ref Range Status  12/25/2021 266 (H) <200 mg/dL Final   LDL Cholesterol (Calc)  Date Value Ref Range Status  12/25/2021 161 (H) mg/dL (calc) Final    Comment:    Reference range: <100 . Desirable range <100 mg/dL for primary prevention;   <70 mg/dL for patients with CHD or diabetic patients  with > or = 2 CHD risk factors. Marland Kitchen LDL-C is now calculated using the Martin-Hopkins  calculation, which is a validated novel method providing  better accuracy than the Friedewald equation in the  estimation of LDL-C.  Cresenciano Genre et al. Annamaria Helling. 3500;938(18): 2061-2068  (http://education.[facility].com/faq/FAQ164)    HDL  Date Value Ref Range Status  12/25/2021 77 > OR = 50 mg/dL Final   Triglycerides  Date Value Ref Range Status  12/25/2021 150 (H) <150 mg/dL Final          Failed - CMP within normal limits and completed in the last 12 months    Albumin  Date Value Ref Range Status  04/14/2018 4.5 3.5 - 5.0 g/dL Final   Alkaline Phosphatase  Date Value Ref Range Status  04/14/2018 79 38 - 126 U/L Final   Alkaline phosphatase (APISO)  Date Value Ref Range Status  12/25/2021 89 37 - 153 U/L Final   ALT  Date Value Ref Range Status  12/25/2021 10 6 - 29 U/L Final   AST  Date Value Ref Range Status  12/25/2021 13 10 - 35 U/L Final   BUN  Date Value Ref Range Status  12/25/2021 9 7 - 25 mg/dL Final   Calcium  Date Value Ref Range Status  12/25/2021 9.6 8.6 - 10.4 mg/dL Final   CO2  Date Value Ref Range Status  12/25/2021 31 20 - 32 mmol/L Final   Creat  Date Value Ref Range Status  12/25/2021 0.91 0.60 - 1.00 mg/dL Final   Glucose, Bld  Date Value Ref Range Status  12/25/2021 90 65 - 99 mg/dL Final    Comment:    .            Fasting reference  interval .    Potassium  Date Value Ref Range Status  12/25/2021 4.5 3.5 - 5.3 mmol/L Final   Sodium  Date Value Ref Range Status  12/25/2021 138 135 - 146 mmol/L Final   Total Bilirubin  Date Value Ref Range Status  12/25/2021 0.3 0.2 - 1.2 mg/dL Final   Total Protein  Date Value Ref Range Status  12/25/2021 6.6 6.1 - 8.1 g/dL Final   GFR calc Af Amer  Date Value Ref Range Status  08/06/2019 >60 >60 mL/min Final   eGFR  Date Value Ref Range Status  12/25/2021 65 > OR = 60 mL/min/1.24m Final    Comment:    The eGFR is based on the CKD-EPI 2021 equation. To calculate  the new eGFR from a previous Creatinine or Cystatin C result, go to https://www.kidney.org/professionals/ kdoqi/gfr%5Fcalculator    GFR calc non Af Amer  Date Value Ref Range Status  08/06/2019 >60 >60 mL/min Final         Passed - Last BP in normal range    BP Readings from Last 1 Encounters:  02/12/22 118/82         Passed - Last Heart Rate in normal range    Pulse Readings from Last 1  Encounters:  02/12/22 97         Passed - CBC within normal limits and completed in the last 12 months    WBC  Date Value Ref Range Status  12/25/2021 7.2 3.8 - 10.8 Thousand/uL Final   RBC  Date Value Ref Range Status  12/25/2021 4.23 3.80 - 5.10 Million/uL Final   Hemoglobin  Date Value Ref Range Status  12/25/2021 12.8 11.7 - 15.5 g/dL Final   HCT  Date Value Ref Range Status  12/25/2021 37.8 35.0 - 45.0 % Final   MCHC  Date Value Ref Range Status  12/25/2021 33.9 32.0 - 36.0 g/dL Final   [facility] Date Value Ref Range Status  12/25/2021 30.3 27.0 - 33.0 pg Final   MCV  Date Value Ref Range Status  12/25/2021 89.4 80.0 - 100.0 fL Final   No results found for: "PLTCOUNTKUC", "LABPLAT", "POCPLA" RDW  Date Value Ref Range Status  12/25/2021 13.8 11.0 - 15.0 % Final

## 2023-01-19 ENCOUNTER — Telehealth: Payer: Self-pay | Admitting: Nurse Practitioner

## 2023-01-19 NOTE — Telephone Encounter (Signed)
Called patient to schedule Medicare Annual Wellness Visit (AWV). Left message for patient to call back and schedule Medicare Annual Wellness Visit (AWV).  AWV-I due as of : 01/28/2010  Please schedule an appointment at any time with [facility].  If any questions, please contact me at [phone].  Thank you ,  [place] Direct Dial: [phone]

## 2023-02-24 ENCOUNTER — Other Ambulatory Visit: Payer: Self-pay | Admitting: Nurse Practitioner

## 2023-02-24 DIAGNOSIS — R451 Restlessness and agitation: Secondary | ICD-10-CM

## 2023-02-25 NOTE — Telephone Encounter (Signed)
Requested medication (s) are due for refill today: yes  Requested medication (s) are on the active medication list: yes  Last refill:  10/08/22  Future visit scheduled: yes  Notes to clinic:  Unable to refill per protocol, cannot delegate.      Requested Prescriptions  Pending Prescriptions Disp Refills   QUEtiapine (SEROQUEL) 25 MG tablet [Pharmacy Med Name: QUETIAPINE FUMARATE 25 MG TAB] 30 tablet 3    Sig: TAKE 1 TABLET BY MOUTH AT BEDTIME     Not Delegated - Psychiatry:  Antipsychotics - Second Generation (Atypical) - quetiapine Failed - 02/24/2023 11:20 AM      Failed - This refill cannot be delegated      Failed - TSH in normal range and within 360 days    No results found for: "TSH", "[place]", "[place]"       Failed - Valid encounter within last 6 months    Recent Outpatient Visits           1 year ago Chronic bilateral low back pain without sciatica   [place], Julie F, FNP   1 year ago Primary hypertension   [facility] Serafina Royals F, FNP              Failed - Lipid Panel in normal range within the last 12 months    Cholesterol  Date Value Ref Range Status  12/25/2021 266 (H) <200 mg/dL Final   LDL Cholesterol (Calc)  Date Value Ref Range Status  12/25/2021 161 (H) mg/dL (calc) Final    Comment:    Reference range: <100 . Desirable range <100 mg/dL for primary prevention;   <70 mg/dL for patients with CHD or diabetic patients  with > or = 2 CHD risk factors. Marland Kitchen LDL-C is now calculated using the Martin-Hopkins  calculation, which is a validated novel method providing  better accuracy than the Friedewald equation in the  estimation of LDL-C.  Cresenciano Genre et al. Annamaria Helling. [id number]): 2061-2068  (http://education.[facility].com/faq/FAQ164)    HDL  Date Value Ref Range Status  12/25/2021 77 > OR = 50 mg/dL Final   Triglycerides  Date Value Ref Range Status  12/25/2021 150 (H)  <150 mg/dL Final         Failed - CBC within normal limits and completed in the last 12 months    WBC  Date Value Ref Range Status  12/25/2021 7.2 3.8 - 10.8 Thousand/uL Final   RBC  Date Value Ref Range Status  12/25/2021 4.23 3.80 - 5.10 Million/uL Final   Hemoglobin  Date Value Ref Range Status  12/25/2021 12.8 11.7 - 15.5 g/dL Final   HCT  Date Value Ref Range Status  12/25/2021 37.8 35.0 - 45.0 % Final   MCHC  Date Value Ref Range Status  12/25/2021 33.9 32.0 - 36.0 g/dL Final   [facility]  Date Value Ref Range Status  12/25/2021 30.3 27.0 - 33.0 pg Final   MCV  Date Value Ref Range Status  12/25/2021 89.4 80.0 - 100.0 fL Final   No results found for: "PLTCOUNTKUC", "LABPLAT", "POCPLA" RDW  Date Value Ref Range Status  12/25/2021 13.8 11.0 - 15.0 % Final         Failed - CMP within normal limits and completed in the last 12 months    Albumin  Date Value Ref Range Status  04/14/2018 4.5 3.5 - 5.0 g/dL Final   Alkaline Phosphatase  Date Value Ref Range Status  04/14/2018  79 38 - 126 U/L Final   Alkaline phosphatase (APISO)  Date Value Ref Range Status  12/25/2021 89 37 - 153 U/L Final   ALT  Date Value Ref Range Status  12/25/2021 10 6 - 29 U/L Final   AST  Date Value Ref Range Status  12/25/2021 13 10 - 35 U/L Final   BUN  Date Value Ref Range Status  12/25/2021 9 7 - 25 mg/dL Final   Calcium  Date Value Ref Range Status  12/25/2021 9.6 8.6 - 10.4 mg/dL Final   CO2  Date Value Ref Range Status  12/25/2021 31 20 - 32 mmol/L Final   Creat  Date Value Ref Range Status  12/25/2021 0.91 0.60 - 1.00 mg/dL Final   Glucose, Bld  Date Value Ref Range Status  12/25/2021 90 65 - 99 mg/dL Final    Comment:    .            Fasting reference interval .    Potassium  Date Value Ref Range Status  12/25/2021 4.5 3.5 - 5.3 mmol/L Final   Sodium  Date Value Ref Range Status  12/25/2021 138 135 - 146 mmol/L Final   Total Bilirubin  Date Value  Ref Range Status  12/25/2021 0.3 0.2 - 1.2 mg/dL Final   Total Protein  Date Value Ref Range Status  12/25/2021 6.6 6.1 - 8.1 g/dL Final   GFR calc Af Amer  Date Value Ref Range Status  08/06/2019 >60 >60 mL/min Final   eGFR  Date Value Ref Range Status  12/25/2021 65 > OR = 60 mL/min/1.43m2 Final    Comment:    The eGFR is based on the CKD-EPI 2021 equation. To calculate  the new eGFR from a previous Creatinine or Cystatin C result, go to https://www.kidney.org/professionals/ kdoqi/gfr%5Fcalculator    GFR calc non Af Amer  Date Value Ref Range Status  08/06/2019 >60 >60 mL/min Final         Passed - Last BP in normal range    BP Readings from Last 1 Encounters:  02/12/22 118/82         Passed - Last Heart Rate in normal range    Pulse Readings from Last 1 Encounters:  02/12/22 97

## 2023-03-03 ENCOUNTER — Encounter: Payer: Self-pay | Admitting: Physician Assistant

## 2023-03-03 ENCOUNTER — Ambulatory Visit: Payer: Self-pay | Admitting: Physician Assistant

## 2023-03-03 VITALS — BP 156/88 | HR 79 | Temp 98.2°F | Resp 16 | Ht 63.0 in | Wt 100.6 lb

## 2023-03-03 DIAGNOSIS — I10 Essential (primary) hypertension: Secondary | ICD-10-CM

## 2023-03-03 DIAGNOSIS — R451 Restlessness and agitation: Secondary | ICD-10-CM

## 2023-03-03 MED ORDER — ENALAPRIL MALEATE 20 MG PO TABS: 20.0000 mg | ORAL_TABLET | Freq: Every day | ORAL | 1 refills | Status: DC

## 2023-03-03 MED ORDER — QUETIAPINE FUMARATE 25 MG PO TABS: 25.0000 mg | ORAL_TABLET | Freq: Every day | ORAL | 3 refills | Status: DC

## 2023-03-03 NOTE — Patient Instructions (Signed)
   Your blood pressure was elevated today.  If possible please take it at home using an electronic blood pressure cuff for the upper arm Record your blood pressure once per day and bring them back with you to your apt so we can make sure you are not developing high blood pressure.   I have sent in a script for Enalapril for you to take once per day to help manage your blood pressure. Please be mindful of low BP and try to check it about once per day.

## 2023-03-03 NOTE — Progress Notes (Unsigned)
Acute Office Visit   Patient: Katherine Price   DOB: 17-Apr-1946   77 y.o. Female  MRN: [id number] Visit Date: 03/03/2023  Today's healthcare provider: Dani Gobble , PA-C  Introduced myself to the patient as a Journalist, newspaper and provided education on APPs in clinical practice.    Chief Complaint  Patient presents with   Medication Refill   Subjective    HPI   Patient is here with Fuller Mandril who is assisting with HPI  Jackelyn Poling reports she is in assisted living facility    Elevated BP in office  Debbie reports she has not been on BP medication for several years  Previous BP was in goal range at last visit  - Medications: Not taking anything currently  - Compliance: NA - Checking BP at home: not checking  - Denies any SOB, CP, vision changes, LE edema, medication SEs, or symptoms of hypotension   Agitation, Anxiety and Depression  Her daughter reports she is out of her Seroquel  She states the patient takes this at night otherwise she has a tendency "act up" She reports patient becomes agitated and aggressive- will start smacking others, cursing and throwing things without Debbie reports the seroquel usually calms her down and lessens the intensity of her aggression Jackelyn Poling denies reports of lethargy, sedation or confusion while taking Seroquel.  Patient reports her sleep is fine           03/03/2023   11:39 AM 02/12/2022    2:07 PM 12/25/2021    1:16 PM  Depression screen PHQ 2/9  Decreased Interest 0 0 0  Down, Depressed, Hopeless 0 0 1  PHQ - 2 Score 0 0 1  Altered sleeping 0 0 0  Tired, decreased energy 0 0 1  Change in appetite 0 0 0  Feeling bad or failure about yourself  0 0 0  Trouble concentrating 0 0 0  Moving slowly or fidgety/restless 0 0 0  Suicidal thoughts 0 0 0  PHQ-9 Score 0 0 2  Difficult doing work/chores Not difficult at all Not difficult at all Not difficult at all      12/25/2021    1:16 PM  GAD 7 : Generalized Anxiety Score  Nervous,  Anxious, on Edge 0  Control/stop worrying 0  Worry too much - different things 0  Trouble relaxing 0  Restless 0  Easily annoyed or irritable 0  Afraid - awful might happen 0  Total GAD 7 Score 0  Anxiety Difficulty Not difficult at all         Medications: Outpatient Medications Prior to Visit  Medication Sig   QUEtiapine (SEROQUEL) 25 MG tablet TAKE 1 TABLET BY MOUTH AT BEDTIME   acetaminophen (TYLENOL) 325 MG tablet Take 2 tablets (650 mg total) by mouth every 6 (six) hours as needed for mild pain. (Patient not taking: Reported on 03/03/2023)   albuterol (VENTOLIN HFA) 108 (90 Base) MCG/ACT inhaler Inhale 2 puffs into the lungs every 6 (six) hours as needed for wheezing or shortness of breath. (Patient not taking: Reported on 03/03/2023)   enalapril (VASOTEC) 20 MG tablet Take 1 tablet (20 mg total) by mouth daily.   hydrochlorothiazide (HYDRODIURIL) 25 MG tablet Take 1 tablet (25 mg total) by mouth daily. (Patient not taking: Reported on 09/11/2019)   hydrOXYzine (VISTARIL) 25 MG capsule Take 1 capsule (25 mg total) by mouth every 6 (six) hours as needed for anxiety. (Patient not taking: Reported  on 03/03/2023)   ibuprofen (ADVIL) 200 MG tablet Take 600 mg by mouth 2 (two) times daily as needed for moderate pain. (Patient not taking: Reported on 03/03/2023)   loperamide (IMODIUM) 1 MG/5ML solution Take 1 mg by mouth as needed for diarrhea or loose stools. (Patient not taking: Reported on 03/03/2023)   methocarbamol (ROBAXIN) 500 MG tablet Take 1 tablet (500 mg total) by mouth 2 (two) times daily as needed for muscle spasms. (Patient not taking: Reported on 03/03/2023)   metoprolol succinate (TOPROL XL) 25 MG 24 hr tablet Take 1 tablet (25 mg total) by mouth daily.   No facility-administered medications prior to visit.    Review of Systems  Psychiatric/Behavioral:  Positive for agitation, behavioral problems and confusion.     {Labs  Heme  Chem  Endocrine  Serology  Results Review  (optional):23779}   Objective    BP (!) 196/94   Pulse 79   Temp 98.2 F (36.8 C) (Oral)   Resp 16   Ht 5\' 3"  (1.6 m)   Wt 100 lb 9.6 oz (45.6 kg)   SpO2 95%   BMI 17.82 kg/m  {Show previous vital signs (optional):23777}  Physical Exam Vitals reviewed.  Constitutional:      General: She is awake.     Appearance: Normal appearance. She is well-developed and well-groomed.  HENT:     Head: Normocephalic and atraumatic.  Neurological:     Mental Status: She is alert.     GCS: GCS eye subscore is 4. GCS verbal subscore is 5. GCS motor subscore is 6.  Psychiatric:        Attention and Perception: She is inattentive.        Mood and Affect: Mood and affect normal.        Speech: Speech is tangential.        Behavior: Behavior is cooperative.        Cognition and Memory: Cognition is impaired. Memory is impaired.       No results found for any visits on 03/03/23.  Assessment & Plan      No follow-ups on file.      Problem List Items Addressed This Visit       Other   Agitation   Relevant Medications   QUEtiapine (SEROQUEL) 25 MG tablet     Return in about 4 weeks (around 03/31/2023) for Elevated BP, Agitation, medication management, Labs .   I,  E , PA-C, have reviewed all documentation for this visit. The documentation on 03/03/23 for the exam, diagnosis, procedures, and orders are all accurate and complete.   Talitha Givens, MHS, PA-C [facility]

## 2023-03-04 DIAGNOSIS — I10 Essential (primary) hypertension: Secondary | ICD-10-CM | POA: Insufficient documentation

## 2023-03-04 NOTE — Assessment & Plan Note (Signed)
Chronic per chart review but does not appear to have been managed with medications for some time BP was elevated in office, improved slightly on recheck at the end of visit but still well above goal Recommend restarting Enalapril 20 mg PO QD for management and checking BP daily to assess response  Recommend follow up in 4 weeks to assess response to medication and recheck BP

## 2023-03-04 NOTE — Assessment & Plan Note (Addendum)
Chronic, ongoing concern Patient is here with her daughter who is providing most of HPI  Daughter reports patient becomes aggressive and agitated, particularly at night and is requesting refills of Quetiapine 25 mg PO QHS to assist with this Patient does show elements of confusion and dementia but I am unsure of etiology at this time Refills sent in per request  Recommend regular follow up to assess progression of neurological status and assess if cognitive enhancers or Neuro referral is appropriate for cognitive status.

## 2023-04-15 ENCOUNTER — Ambulatory Visit (INDEPENDENT_AMBULATORY_CARE_PROVIDER_SITE_OTHER): Payer: Medicare (Managed Care)

## 2023-04-15 ENCOUNTER — Ambulatory Visit (INDEPENDENT_AMBULATORY_CARE_PROVIDER_SITE_OTHER): Payer: Medicare (Managed Care) | Admitting: Nurse Practitioner

## 2023-04-15 ENCOUNTER — Other Ambulatory Visit: Payer: Self-pay

## 2023-04-15 ENCOUNTER — Encounter: Payer: Self-pay | Admitting: Nurse Practitioner

## 2023-04-15 VITALS — BP 134/78 | HR 92 | Temp 97.7°F | Resp 18 | Ht 63.0 in | Wt 100.2 lb

## 2023-04-15 VITALS — BP 134/78 | Ht 63.0 in | Wt 100.0 lb

## 2023-04-15 DIAGNOSIS — I2510 Atherosclerotic heart disease of native coronary artery without angina pectoris: Secondary | ICD-10-CM

## 2023-04-15 DIAGNOSIS — R451 Restlessness and agitation: Secondary | ICD-10-CM | POA: Diagnosis not present

## 2023-04-15 DIAGNOSIS — Z114 Encounter for screening for human immunodeficiency virus [HIV]: Secondary | ICD-10-CM

## 2023-04-15 DIAGNOSIS — I10 Essential (primary) hypertension: Secondary | ICD-10-CM | POA: Diagnosis not present

## 2023-04-15 DIAGNOSIS — Z1159 Encounter for screening for other viral diseases: Secondary | ICD-10-CM

## 2023-04-15 DIAGNOSIS — G8929 Other chronic pain: Secondary | ICD-10-CM

## 2023-04-15 DIAGNOSIS — Z0000 Encounter for general adult medical examination without abnormal findings: Secondary | ICD-10-CM

## 2023-04-15 DIAGNOSIS — F419 Anxiety disorder, unspecified: Secondary | ICD-10-CM | POA: Diagnosis not present

## 2023-04-15 DIAGNOSIS — Z1231 Encounter for screening mammogram for malignant neoplasm of breast: Secondary | ICD-10-CM

## 2023-04-15 DIAGNOSIS — Z113 Encounter for screening for infections with a predominantly sexual mode of transmission: Secondary | ICD-10-CM

## 2023-04-15 DIAGNOSIS — I252 Old myocardial infarction: Secondary | ICD-10-CM

## 2023-04-15 DIAGNOSIS — Z780 Asymptomatic menopausal state: Secondary | ICD-10-CM

## 2023-04-15 DIAGNOSIS — M1990 Unspecified osteoarthritis, unspecified site: Secondary | ICD-10-CM | POA: Insufficient documentation

## 2023-04-15 DIAGNOSIS — M545 Low back pain, unspecified: Secondary | ICD-10-CM | POA: Diagnosis not present

## 2023-04-15 DIAGNOSIS — Z13820 Encounter for screening for osteoporosis: Secondary | ICD-10-CM

## 2023-04-15 DIAGNOSIS — E785 Hyperlipidemia, unspecified: Secondary | ICD-10-CM | POA: Insufficient documentation

## 2023-04-15 DIAGNOSIS — F32A Depression, unspecified: Secondary | ICD-10-CM | POA: Insufficient documentation

## 2023-04-15 DIAGNOSIS — J449 Chronic obstructive pulmonary disease, unspecified: Secondary | ICD-10-CM

## 2023-04-15 MED ORDER — ATORVASTATIN CALCIUM 10 MG PO TABS: 10.0000 mg | ORAL_TABLET | Freq: Every day | ORAL | 3 refills | Status: DC

## 2023-04-15 NOTE — Assessment & Plan Note (Signed)
Takes tylenol occasionally for pain

## 2023-04-15 NOTE — Assessment & Plan Note (Signed)
Patient has quit smoking and reports breathing has been good.

## 2023-04-15 NOTE — Assessment & Plan Note (Signed)
Reports she is doing well. Uses seroquel at night for agitation. Getting labs.

## 2023-04-15 NOTE — Progress Notes (Signed)
Subjective:   Katherine Price is a 77 y.o. female who presents for Medicare Annual (Subsequent) preventive examination.  Review of Systems    Cardiac Risk Factors include: hypertension;dyslipidemia;sedentary lifestyle;advanced age (>26men, >92 women)    Objective:    Today's Vitals   04/15/23 1357  BP: 134/78  Weight: 100 lb (45.4 kg)  Height: 5\' 3"  (1.6 m)   Body mass index is 17.71 kg/m.     04/15/2023    2:16 PM 11/11/2021    3:41 PM 09/14/2019    8:16 AM 08/06/2019    7:13 AM 08/03/2019    9:28 PM 11/02/2018    4:30 PM 02/26/2018   11:06 AM  Advanced Directives  Does Patient Have a Medical Advance Directive? No No No No No No No  Would patient like information on creating a medical advance directive?    No - Patient declined  No - Patient declined No - Patient declined    Current Medications (verified) Outpatient Encounter Medications as of 04/15/2023  Medication Sig   acetaminophen (TYLENOL) 325 MG tablet Take 2 tablets (650 mg total) by mouth every 6 (six) hours as needed for mild pain.   atorvastatin (LIPITOR) 10 MG tablet Take 1 tablet (10 mg total) by mouth daily.   enalapril (VASOTEC) 20 MG tablet Take 1 tablet (20 mg total) by mouth daily.   QUEtiapine (SEROQUEL) 25 MG tablet Take 1 tablet (25 mg total) by mouth at bedtime.   No facility-administered encounter medications on file as of 04/15/2023.    Allergies (verified) Patient has no known allergies.   History: Past Medical History:  Diagnosis Date   Anxiety    Arthritis    Chronic back pain    Chronic bronchitis (HCC)    COPD (chronic obstructive pulmonary disease) (HCC)    Coronary artery disease    Depression    Dyspnea    orthopnea   Heart murmur    History of peptic ulcer    Hyperlipidemia    Hypertension    Myocardial infarction (HCC)    Orthopnea    Pneumonia    no to pneumonia but yes to chronic bronchitis   Renal insufficiency    Wheezing    Past Surgical History:  Procedure  Laterality Date   ABDOMINAL HYSTERECTOMY     BLADDER SUSPENSION     CATARACT EXTRACTION W/PHACO Right 09/14/2019   Procedure: CATARACT EXTRACTION PHACO AND INTRAOCULAR LENS PLACEMENT (IOC), RIGHT;  Surgeon: Elliot Cousin, MD;  Location: [facility] ORS;  Service: Ophthalmology;  Laterality: Right;  Lot #[id number] H [place]: 01:10.7 CDE; 12.49   CATARACT EXTRACTION W/PHACO Left 10/19/2019   Procedure: CATARACT EXTRACTION PHACO AND INTRAOCULAR LENS PLACEMENT (IOC) LEFT VISION BLUE;  Surgeon: Elliot Cousin, MD;  Location: [facility] ORS;  Service: Ophthalmology;  Laterality: Left;  [place] 01:19.8 CDE 11.74 Fluid Pack Lot [id number]   CORONARY ARTERY BYPASS GRAFT  2003   LACERATION REPAIR     of forehead   TIBIA FRACTURE SURGERY     rod implanted   No family history on file. Social History   Socioeconomic History   Marital status: Widowed    Spouse name: Not on file   Number of children: Not on file   Years of education: Not on file   Highest education level: Not on file  Occupational History   Not on file  Tobacco Use   Smoking status: Former    Packs/day: 1    Types: Cigarettes    Quit date: 04/30/2022  Years since quitting: 0.9   Smokeless tobacco: Never  Vaping Use   Vaping Use: Never used  Substance and Sexual Activity   Alcohol use: No   Drug use: No   Sexual activity: Not on file  Other Topics Concern   Not on file  Social History Narrative   VISION BLUE    Social Determinants of Health   Financial Resource Strain: Medium Risk (04/15/2023)   Overall Financial Resource Strain (CARDIA)    Difficulty of Paying Living Expenses: Somewhat hard  Food Insecurity: No Food Insecurity (04/15/2023)   Hunger Vital Sign    Worried About Running Out of Food in the Last Year: Never true    Ran Out of Food in the Last Year: Never true  Transportation Needs: No Transportation Needs (04/15/2023)   PRAPARE - Administrator, Civil Service (Medical): No    Lack of Transportation (Non-Medical):  No  Physical Activity: Inactive (04/15/2023)   Exercise Vital Sign    Days of Exercise per Week: 0 days    Minutes of Exercise per Session: 0 min  Stress: No Stress Concern Present (04/15/2023)   [employer] of Occupational Health - Occupational Stress Questionnaire    Feeling of Stress : Not at all  Social Connections: Socially Isolated (04/15/2023)   Social Connection and Isolation Panel [NHANES]    Frequency of Communication with Friends and Family: Three times a week    Frequency of Social Gatherings with Friends and Family: Once a week    Attends Religious Services: Never    Database administrator or Organizations: No    Attends Banker Meetings: Never    Marital Status: Widowed    Tobacco Counseling Counseling given: Not Answered   Clinical Intake:  Pre-visit preparation completed: Yes  Pain : No/denies pain     BMI - recorded: 17.71 Nutritional Status: BMI <19  Underweight Nutritional Risks: None Diabetes: No  How often do you need to have someone help you when you read instructions, pamphlets, or other written materials from your doctor or pharmacy?: 1 - Never  Diabetic?no  Interpreter Needed?: No  Comments: lives at home care center Information entered by :: B.,LPN   Activities of Daily Living    04/15/2023    2:20 PM 04/15/2023    1:17 PM  In your present state of health, do you have any difficulty performing the following activities:  Hearing? 0 0  Vision? 0 0  Difficulty concentrating or making decisions? 1 1  Walking or climbing stairs? 0 0  Dressing or bathing? 1 1  Doing errands, shopping? 1 1  Preparing Food and eating ? N   Using the Toilet? N   In the past six months, have you accidently leaked urine? Y   Do you have problems with loss of bowel control? N   Managing your Medications? Y   Managing your Finances? Y   Housekeeping or managing your Housekeeping? Y     Patient Care Team: Berniece Salines, FNP as PCP  - General (Nurse Practitioner)  Indicate any recent Medical Services you may have received from other than [facility] providers in the past year (date may be approximate).     Assessment:   This is a routine wellness examination for Katherine Price.  Hearing/Vision screen Hearing Screening - Comments:: Adequate hearing Vision Screening - Comments:: Adequate vision No eye dr  Dietary issues and exercise activities discussed: Current Exercise Habits: The patient does not participate in regular exercise  at present, Exercise limited by: psychological condition(s);orthopedic condition(s);respiratory conditions(s)   Goals Addressed   None    Depression Screen    04/15/2023    2:04 PM 04/15/2023    1:17 PM 03/03/2023   11:39 AM 02/12/2022    2:07 PM 12/25/2021    1:16 PM  PHQ 2/9 Scores  PHQ - 2 Score 0 0 0 0 1  PHQ- 9 Score  0 0 0 2    Fall Risk    04/15/2023    2:02 PM 04/15/2023    1:16 PM 03/03/2023   11:39 AM 02/12/2022    2:07 PM 12/25/2021    1:16 PM  Fall Risk   Falls in the past year? 0 0 0 0 1  Number falls in past yr: 0 0 0 0 0  Injury with Fall? 0 0 0 0 1  Risk for fall due to : No Fall Risks  No Fall Risks    Follow up Education provided;Falls prevention discussed  Falls prevention discussed;Education provided;Falls evaluation completed  Falls evaluation completed    FALL RISK PREVENTION PERTAINING TO THE HOME:  Any stairs in or around the home? No  If so, are there any without handrails? No  Home free of loose throw rugs in walkways, pet beds, electrical cords, etc? Yes  Adequate lighting in your home to reduce risk of falls? Yes   ASSISTIVE DEVICES UTILIZED TO PREVENT FALLS:  Life alert? No  Use of a cane, walker or w/c? No  Grab bars in the bathroom? Yes  Shower chair or bench in shower? No  Elevated toilet seat or a handicapped toilet? No   TIMED UP AND GO:  Was the test performed? Yes .  Length of time to ambulate 10 feet: 12 sec.   Gait slow and steady without use  of assistive device  Cognitive Function:        04/15/2023    2:21 PM  6CIT Screen  What Year? 0 points  What month? 0 points  What time? 0 points  Count back from 20 0 points  Months in reverse 0 points  Repeat phrase 4 points  Total Score 4 points    Immunizations Immunization History  Administered Date(s) Administered   Influenza-Unspecified 10/13/2007, 11/19/2009, 01/24/2014, 12/19/2021   Moderna Sars-Covid-2 Vaccination 12/19/2020, 12/19/2021   PPD Test 12/19/2021, 01/02/2022   Pneumococcal Polysaccharide-23 01/22/2010    TDAP status: Up to date  Flu Vaccine status: Up to date  Pneumococcal vaccine status: Up to date  Covid-19 vaccine status: Completed vaccines  Qualifies for Shingles Vaccine? Yes   Zostavax completed No   Shingrix Completed?: No.    Education has been provided regarding the importance of this vaccine. Patient has been advised to call insurance company to determine out of pocket expense if they have not yet received this vaccine. Advised may also receive vaccine at local pharmacy or Health Dept. Verbalized acceptance and understanding.  Screening Tests Health Maintenance  Topic Date Due   MAMMOGRAM  Never done   Hepatitis C Screening  Never done   DTaP/Tdap/Td (1 - Tdap) Never done   Zoster Vaccines- Shingrix (1 of 2) Never done   DEXA SCAN  Never done   Pneumonia Vaccine 23+ Years old (2 of 2 - PCV) 01/22/2011   COVID-19 Vaccine (3 - 2023-24 season) 07/31/2022   INFLUENZA VACCINE  07/01/2023   Medicare Annual Wellness (AWV)  04/14/2024   HPV VACCINES  Aged Out    Health Maintenance  Health Maintenance Due  Topic Date Due   MAMMOGRAM  Never done   Hepatitis C Screening  Never done   DTaP/Tdap/Td (1 - Tdap) Never done   Zoster Vaccines- Shingrix (1 of 2) Never done   DEXA SCAN  Never done   Pneumonia Vaccine 66+ Years old (2 of 2 - PCV) 01/22/2011   COVID-19 Vaccine (3 - 2023-24 season) 07/31/2022    Colorectal cancer screening:  No longer required.   Mammogram status: No longer required due to age.  Lung Cancer Screening: (Low Dose CT Chest recommended if Age 61-80 years, 30 pack-year currently smoking OR have quit w/in 15years.) does not qualify.   Lung Cancer Screening Referral: no pt declines  Additional Screening:  Hepatitis C Screening: does not qualify; Completed yes  Vision Screening: Recommended annual ophthalmology exams for early detection of glaucoma and other disorders of the eye. Is the patient up to date with their annual eye exam?  No  Who is the provider or what is the name of the office in which the patient attends annual eye exams? None pt declines If pt is not established with a provider, would they like to be referred to a provider to establish care? No .   Dental Screening: Recommended annual dental exams for proper oral hygiene  Community Resource Referral / Chronic Care Management: CRR required this visit?  No   CCM required this visit?  No      Plan:     I have personally reviewed and noted the following in the patient's chart:   Medical and social history Use of alcohol, tobacco or illicit drugs  Current medications and supplements including opioid prescriptions. Patient is not currently taking opioid prescriptions. Functional ability and status Nutritional status Physical activity Advanced directives List of other physicians Hospitalizations, surgeries, and ER visits in previous 12 months Vitals Screenings to include cognitive, depression, and falls Referrals and appointments  In addition, I have reviewed and discussed with patient certain preventive protocols, quality metrics, and best practice recommendations. A written personalized care plan for preventive services as well as general preventive health recommendations were provided to patient.     Katherine BROUSSARD, LPN   1/61/0960   Nurse Notes: pt brought in by daughter, she does not drive and lives in group  home. Pt declines health care gaps and states she does not need any of these things now..Daughter looking for resources to help with depends and things pt needs. 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 Daughter also considering changing to [employer] in January 2025.

## 2023-04-15 NOTE — Assessment & Plan Note (Signed)
Discussed restarting atorvastatin 10 mg daily

## 2023-04-15 NOTE — Assessment & Plan Note (Addendum)
Patient blood pressure has improved,  Will continue enalapril 20 mg daily

## 2023-04-15 NOTE — Assessment & Plan Note (Signed)
Takes tylenol as needed for pain

## 2023-04-15 NOTE — Progress Notes (Signed)
BP 134/78   Pulse 92   Temp 97.7 F (36.5 C) (Oral)   Resp 18   Ht 5\' 3"  (1.6 m)   Wt 100 lb 3.2 oz (45.5 kg)   SpO2 99%   BMI 17.75 kg/m    Subjective:    Patient ID: Katherine Price, female    DOB: November 30, 1946, 77 y.o.   MRN: [id number]  HPI: Katherine Price is a 77 y.o. female  Chief Complaint  Patient presents with   Hypertension    6 month follow up   Depression   Hyperlipidemia   Hypertension: patient was seen on 03/03/2023 by Denny Peon Mecum PA.  Her blood pressure was 156/88.  She was started on enalapril 20 mg daily.  Patient blood pressure today is 134/78. She denies any chest pain, shortness of breath, headaches or blurred vision.  Will continue with current treatment. She has noticed that she is having to pee more.    Anxiety and Depression/agitation :  She says she has had anxiety and depression off and on her whole life.  She denies any suicidal thoughts. She lives in a group home and often "acts up" at night.  She can become agitated and aggressive and will hit others and throw things.  She has been taking seroquel at night which has helped.       04/15/2023    2:04 PM 04/15/2023    1:17 PM 03/03/2023   11:39 AM 02/12/2022    2:07 PM 12/25/2021    1:16 PM  Depression screen PHQ 2/9  Decreased Interest 0 0 0 0 0  Down, Depressed, Hopeless 0 0 0 0 1  PHQ - 2 Score 0 0 0 0 1  Altered sleeping  0 0 0 0  Tired, decreased energy  0 0 0 1  Change in appetite  0 0 0 0  Feeling bad or failure about yourself   0 0 0 0  Trouble concentrating  0 0 0 0  Moving slowly or fidgety/restless  0 0 0 0  Suicidal thoughts  0 0 0 0  PHQ-9 Score  0 0 0 2  Difficult doing work/chores Not difficult at all Not difficult at all Not difficult at all Not difficult at all Not difficult at all       04/15/2023    1:17 PM 12/25/2021    1:16 PM  GAD 7 : Generalized Anxiety Score  Nervous, Anxious, on Edge 0 0  Control/stop worrying 0 0  Worry too much - different things 0 0  Trouble relaxing  0 0  Restless 0 0  Easily annoyed or irritable 0 0  Afraid - awful might happen 0 0  Total GAD 7 Score 0 0  Anxiety Difficulty Not difficult at all Not difficult at all    Arthritis/chronic back pain:  She says she has had arthritis for several years.  She says she takes tylenol as needed for pain.   COPD:  patient reports her breathing has been good. And she has quit smoking.    CAD/MI:  She says she had a heart attack a long time ago and had open heart surgery.  She said she is never doing that again and she says she has never gone back to see the cardiologist. She denies any chest pain or shortness of breath.   Hyperlipidemia:  her last LDL was 161 on 12/25/2021.  She was previously on atorvastatin 40 mg daily. But she has not been on  it for some time.  Recommend restarting it.  Will get labs today.   Relevant past medical, surgical, family and social history reviewed and updated as indicated. Interim medical history since our last visit reviewed. Allergies and medications reviewed and updated.  Review of Systems  Constitutional: Negative for fever or weight change.  Respiratory: Negative for cough and shortness of breath.   Cardiovascular: Negative for chest pain or palpitations.  Gastrointestinal: Negative for abdominal pain, no bowel changes.  Musculoskeletal: Negative for gait problem or joint swelling.  Skin: Negative for rash.  Neurological: Negative for dizziness or headache.  No other specific complaints in a complete review of systems (except as listed in HPI above).      Objective:    BP 134/78   Pulse 92   Temp 97.7 F (36.5 C) (Oral)   Resp 18   Ht 5\' 3"  (1.6 m)   Wt 100 lb 3.2 oz (45.5 kg)   SpO2 99%   BMI 17.75 kg/m   Wt Readings from Last 3 Encounters:  04/15/23 100 lb (45.4 kg)  04/15/23 100 lb 3.2 oz (45.5 kg)  03/03/23 100 lb 9.6 oz (45.6 kg)    Physical Exam  Constitutional: Patient appears well-developed and well-nourished. No distress.  HEENT:  head atraumatic, normocephalic, pupils equal and reactive to light, neck supple Cardiovascular: Normal rate, regular rhythm and normal heart sounds.  No murmur heard. No BLE edema. Pulmonary/Chest: Effort normal and breath sounds normal. No respiratory distress. Abdominal: Soft.  There is no tenderness. Psychiatric: Patient has a normal mood and affect. behavior is normal. Judgment and thought content normal.   Results for orders placed or performed in visit on 12/25/21  Lipid panel  Result Value Ref Range   Cholesterol 266 (H) <200 mg/dL   HDL 77 > OR = 50 mg/dL   Triglycerides 161 (H) <150 mg/dL   LDL Cholesterol (Calc) 161 (H) mg/dL (calc)   Total CHOL/HDL Ratio 3.5 <5.0 (calc)   Non-HDL Cholesterol (Calc) 189 (H) <130 mg/dL (calc)  CBC with Differential/Platelet  Result Value Ref Range   WBC 7.2 3.8 - 10.8 Thousand/uL   RBC 4.23 3.80 - 5.10 Million/uL   Hemoglobin 12.8 11.7 - 15.5 g/dL   HCT 09.6 04.5 - 40.9 %   MCV 89.4 80.0 - 100.0 fL   MCH 30.3 27.0 - 33.0 pg   MCHC 33.9 32.0 - 36.0 g/dL   RDW 81.1 91.4 - 78.2 %   Platelets 389 140 - 400 Thousand/uL   MPV 10.1 7.5 - 12.5 fL   Neutro Abs 4,982 1,500 - 7,800 cells/uL   Lymphs Abs 1,584 850 - 3,900 cells/uL   Absolute Monocytes 389 200 - 950 cells/uL   Eosinophils Absolute 158 15 - 500 cells/uL   Basophils Absolute 86 0 - 200 cells/uL   Neutrophils Relative % 69.2 %   Total Lymphocyte 22.0 %   Monocytes Relative 5.4 %   Eosinophils Relative 2.2 %   Basophils Relative 1.2 %  COMPLETE METABOLIC PANEL WITH GFR  Result Value Ref Range   Glucose, Bld 90 65 - 99 mg/dL   BUN 9 7 - 25 mg/dL   Creat 9.56 2.13 - 0.86 mg/dL   eGFR 65 > OR = 60 VH/QIO/9.62X5   BUN/Creatinine Ratio NOT APPLICABLE 6 - 22 (calc)   Sodium 138 135 - 146 mmol/L   Potassium 4.5 3.5 - 5.3 mmol/L   Chloride 101 98 - 110 mmol/L   CO2 31 20 - 32 mmol/L  Calcium 9.6 8.6 - 10.4 mg/dL   Total Protein 6.6 6.1 - 8.1 g/dL   Albumin 4.3 3.6 - 5.1 g/dL    Globulin 2.3 1.9 - 3.7 g/dL (calc)   AG Ratio 1.9 1.0 - 2.5 (calc)   Total Bilirubin 0.3 0.2 - 1.2 mg/dL   Alkaline phosphatase (APISO) 89 37 - 153 U/L   AST 13 10 - 35 U/L   ALT 10 6 - 29 U/L      Assessment & Plan:   Problem List Items Addressed This Visit       Cardiovascular and Mediastinum   CAD (coronary artery disease)    Discussed restarting atorvastatin 10 mg daily       Relevant Medications   atorvastatin (LIPITOR) 10 MG tablet   Primary hypertension - Primary    Patient blood pressure has improved,  Will continue enalapril 20 mg daily      Relevant Medications   atorvastatin (LIPITOR) 10 MG tablet   Other Relevant Orders   CBC with Differential/Platelet   COMPLETE METABOLIC PANEL WITH GFR     Respiratory   COPD (chronic obstructive pulmonary disease) (HCC)    Patient has quit smoking and reports breathing has been good.         Musculoskeletal and Integument   Arthritis    Takes tylenol as needed for pain        Other   Agitation    Reports she is doing well. Uses seroquel at night for agitation. Getting labs.       Relevant Orders   RPR   History of MI (myocardial infarction)    Restarting atorvastatin 10 mg daily. Denies any chest pain or shortness of breath      Hyperlipidemia    Start atorvastatin 10 mg daily      Relevant Medications   atorvastatin (LIPITOR) 10 MG tablet   Other Relevant Orders   Lipid panel   Chronic bilateral low back pain without sciatica    Takes tylenol occasionally for pain      Anxiety and depression    Reports she is doing well. Uses seroquel at night for agitation      Other Visit Diagnoses     Encounter for hepatitis C screening test for low risk patient       Relevant Orders   Hepatitis C antibody   Screening for STDs (sexually transmitted diseases)       Relevant Orders   RPR   Screening for HIV without presence of risk factors       Relevant Orders   HIV Antibody (routine testing w rflx)    Encounter for screening mammogram for malignant neoplasm of breast       Relevant Orders   MM 3D SCREENING MAMMOGRAM BILATERAL BREAST   Encounter for screening for osteoporosis       Relevant Orders   DG Bone Density   Postmenopausal estrogen deficiency       Relevant Orders   DG Bone Density        Follow up plan: Return in about 6 months (around 10/16/2023) for follow up.

## 2023-04-15 NOTE — Assessment & Plan Note (Signed)
Start atorvastatin 10 mg daily.

## 2023-04-15 NOTE — Assessment & Plan Note (Signed)
Reports she is doing well. Uses seroquel at night for agitation

## 2023-04-15 NOTE — Patient Instructions (Signed)
Katherine Price , Thank you for taking time to come for your Medicare Wellness Visit. I appreciate your ongoing commitment to your health goals. Please review the following plan we discussed and let me know if I can assist you in the future.   These are the goals we discussed:  Goals   None     This is a list of the screening recommended for you and due dates:  Health Maintenance  Topic Date Due   Mammogram  Never done   Hepatitis C Screening: USPSTF Recommendation to screen - Ages 94-79 yo.  Never done   DTaP/Tdap/Td vaccine (1 - Tdap) Never done   Zoster (Shingles) Vaccine (1 of 2) Never done   DEXA scan (bone density measurement)  Never done   Pneumonia Vaccine (2 of 2 - PCV) 01/22/2011   COVID-19 Vaccine (3 - 2023-24 season) 07/31/2022   Flu Shot  07/01/2023   Medicare Annual Wellness Visit  04/14/2024   HPV Vaccine  Aged Out    Advanced directives: no  Conditions/risks identified: low falls risk  Next appointment: Follow up in one year for your annual wellness visit 04/20/2024 @2pm  in person   Preventive Care 65 Years and Older, Female Preventive care refers to lifestyle choices and visits with your health care provider that can promote health and wellness. What does preventive care include? A yearly physical exam. This is also called an annual well check. Dental exams once or twice a year. Routine eye exams. Ask your health care provider how often you should have your eyes checked. Personal lifestyle choices, including: Daily care of your teeth and gums. Regular physical activity. Eating a healthy diet. Avoiding tobacco and drug use. Limiting alcohol use. Practicing safe sex. Taking low-dose aspirin every day. Taking vitamin and mineral supplements as recommended by your health care provider. What happens during an annual well check? The services and screenings done by your health care provider during your annual well check will depend on your age, overall health,  lifestyle risk factors, and family history of disease. Counseling  Your health care provider may ask you questions about your: Alcohol use. Tobacco use. Drug use. Emotional well-being. Home and relationship well-being. Sexual activity. Eating habits. History of falls. Memory and ability to understand (cognition). Work and work Astronomer. Reproductive health. Screening  You may have the following tests or measurements: Height, weight, and BMI. Blood pressure. Lipid and cholesterol levels. These may be checked every 5 years, or more frequently if you are over 26 years old. Skin check. Lung cancer screening. You may have this screening every year starting at age 57 if you have a 30-pack-year history of smoking and currently smoke or have quit within the past 15 years. Fecal occult blood test (FOBT) of the stool. You may have this test every year starting at age 108. Flexible sigmoidoscopy or colonoscopy. You may have a sigmoidoscopy every 5 years or a colonoscopy every 10 years starting at age 15. Hepatitis C blood test. Hepatitis B blood test. Sexually transmitted disease (STD) testing. Diabetes screening. This is done by checking your blood sugar (glucose) after you have not eaten for a while (fasting). You may have this done every 1-3 years. Bone density scan. This is done to screen for osteoporosis. You may have this done starting at age 68. Mammogram. This may be done every 1-2 years. Talk to your health care provider about how often you should have regular mammograms. Talk with your health care provider about your test results,  treatment options, and if necessary, the need for more tests. Vaccines  Your health care provider may recommend certain vaccines, such as: Influenza vaccine. This is recommended every year. Tetanus, diphtheria, and acellular pertussis (Tdap, Td) vaccine. You may need a Td booster every 10 years. Zoster vaccine. You may need this after age  67. Pneumococcal 13-valent conjugate (PCV13) vaccine. One dose is recommended after age 24. Pneumococcal polysaccharide (PPSV23) vaccine. One dose is recommended after age 79. Talk to your health care provider about which screenings and vaccines you need and how often you need them. This information is not intended to replace advice given to you by your health care provider. Make sure you discuss any questions you have with your health care provider. Document Released: 12/13/2015 Document Revised: 08/05/2016 Document Reviewed: 09/17/2015 Elsevier Interactive Patient Education  2017 [facility] Prevention in the Home Falls can cause injuries. They can happen to people of all ages. There are many things you can do to make your home safe and to help prevent falls. What can I do on the outside of my home? Regularly fix the edges of walkways and driveways and fix any cracks. Remove anything that might make you trip as you walk through a door, such as a raised step or threshold. Trim any bushes or trees on the path to your home. Use bright outdoor lighting. Clear any walking paths of anything that might make someone trip, such as rocks or tools. Regularly check to see if handrails are loose or broken. Make sure that both sides of any steps have handrails. Any raised decks and porches should have guardrails on the edges. Have any leaves, snow, or ice cleared regularly. Use sand or salt on walking paths during winter. Clean up any spills in your garage right away. This includes oil or grease spills. What can I do in the bathroom? Use night lights. Install grab bars by the toilet and in the tub and shower. Do not use towel bars as grab bars. Use non-skid mats or decals in the tub or shower. If you need to sit down in the shower, use a plastic, non-slip stool. Keep the floor dry. Clean up any water that spills on the floor as soon as it happens. Remove soap buildup in the tub or shower  regularly. Attach bath mats securely with double-sided non-slip rug tape. Do not have throw rugs and other things on the floor that can make you trip. What can I do in the bedroom? Use night lights. Make sure that you have a light by your bed that is easy to reach. Do not use any sheets or blankets that are too big for your bed. They should not hang down onto the floor. Have a firm chair that has side arms. You can use this for support while you get dressed. Do not have throw rugs and other things on the floor that can make you trip. What can I do in the kitchen? Clean up any spills right away. Avoid walking on wet floors. Keep items that you use a lot in easy-to-reach places. If you need to reach something above you, use a strong step stool that has a grab bar. Keep electrical cords out of the way. Do not use floor polish or wax that makes floors slippery. If you must use wax, use non-skid floor wax. Do not have throw rugs and other things on the floor that can make you trip. What can I do with my stairs? Do  not leave any items on the stairs. Make sure that there are handrails on both sides of the stairs and use them. Fix handrails that are broken or loose. Make sure that handrails are as long as the stairways. Check any carpeting to make sure that it is firmly attached to the stairs. Fix any carpet that is loose or worn. Avoid having throw rugs at the top or bottom of the stairs. If you do have throw rugs, attach them to the floor with carpet tape. Make sure that you have a light switch at the top of the stairs and the bottom of the stairs. If you do not have them, ask someone to add them for you. What else can I do to help prevent falls? Wear shoes that: Do not have high heels. Have rubber bottoms. Are comfortable and fit you well. Are closed at the toe. Do not wear sandals. If you use a stepladder: Make sure that it is fully opened. Do not climb a closed stepladder. Make sure that  both sides of the stepladder are locked into place. Ask someone to hold it for you, if possible. Clearly mark and make sure that you can see: Any grab bars or handrails. First and last steps. Where the edge of each step is. Use tools that help you move around (mobility aids) if they are needed. These include: Canes. Walkers. Scooters. Crutches. Turn on the lights when you go into a dark area. Replace any light bulbs as soon as they burn out. Set up your furniture so you have a clear path. Avoid moving your furniture around. If any of your floors are uneven, fix them. If there are any pets around you, be aware of where they are. Review your medicines with your doctor. Some medicines can make you feel dizzy. This can increase your chance of falling. Ask your doctor what other things that you can do to help prevent falls. This information is not intended to replace advice given to you by your health care provider. Make sure you discuss any questions you have with your health care provider. Document Released: 09/12/2009 Document Revised: 04/23/2016 Document Reviewed: 12/21/2014 Elsevier Interactive Patient Education  2017 [employer].

## 2023-04-15 NOTE — Assessment & Plan Note (Signed)
Restarting atorvastatin 10 mg daily. Denies any chest pain or shortness of breath

## 2023-04-16 LAB — CBC WITH DIFFERENTIAL/PLATELET
Absolute Monocytes: 338 cells/uL (ref 200–950)
Basophils Absolute: 80 cells/uL (ref 0–200)
Basophils Relative: 0.9 %
Eosinophils Absolute: 205 cells/uL (ref 15–500)
Eosinophils Relative: 2.3 %
HCT: 40.1 % (ref 35.0–45.0)
Hemoglobin: 13.2 g/dL (ref 11.7–15.5)
Lymphs Abs: 2181 cells/uL (ref 850–3900)
MCH: 29.5 pg (ref 27.0–33.0)
MCHC: 32.9 g/dL (ref 32.0–36.0)
MCV: 89.5 fL (ref 80.0–100.0)
MPV: 11.8 fL (ref 7.5–12.5)
Monocytes Relative: 3.8 %
Neutro Abs: 6097 cells/uL (ref 1500–7800)
Neutrophils Relative %: 68.5 %
Platelets: 379 10*3/uL (ref 140–400)
RBC: 4.48 10*6/uL (ref 3.80–5.10)
RDW: 13.4 % (ref 11.0–15.0)
Total Lymphocyte: 24.5 %
WBC: 8.9 10*3/uL (ref 3.8–10.8)

## 2023-04-16 LAB — COMPLETE METABOLIC PANEL WITH GFR
AG Ratio: 1.8 (calc) (ref 1.0–2.5)
ALT: 9 U/L (ref 6–29)
AST: 15 U/L (ref 10–35)
Albumin: 4.5 g/dL (ref 3.6–5.1)
Alkaline phosphatase (APISO): 85 U/L (ref 37–153)
BUN/Creatinine Ratio: 17 (calc) (ref 6–22)
BUN: 18 mg/dL (ref 7–25)
CO2: 26 mmol/L (ref 20–32)
Calcium: 9.6 mg/dL (ref 8.6–10.4)
Chloride: 106 mmol/L (ref 98–110)
Creat: 1.05 mg/dL — ABNORMAL HIGH (ref 0.60–1.00)
Globulin: 2.5 g/dL (calc) (ref 1.9–3.7)
Glucose, Bld: 90 mg/dL (ref 65–99)
Potassium: 4.5 mmol/L (ref 3.5–5.3)
Sodium: 143 mmol/L (ref 135–146)
Total Bilirubin: 0.4 mg/dL (ref 0.2–1.2)
Total Protein: 7 g/dL (ref 6.1–8.1)
eGFR: 54 mL/min/{1.73_m2} — ABNORMAL LOW (ref >=60)

## 2023-04-16 LAB — LIPID PANEL
Cholesterol: 292 mg/dL — ABNORMAL HIGH (ref <200)
HDL: 69 mg/dL (ref >=50)
LDL Cholesterol (Calc): 200 mg/dL (calc) — ABNORMAL HIGH
Non-HDL Cholesterol (Calc): 223 mg/dL (calc) — ABNORMAL HIGH (ref <130)
Total CHOL/HDL Ratio: 4.2 (calc) (ref <5.0)
Triglycerides: 99 mg/dL (ref <150)

## 2023-04-16 LAB — HEPATITIS C ANTIBODY: Hepatitis C Ab: NONREACTIVE

## 2023-04-16 LAB — RPR: RPR Ser Ql: NONREACTIVE

## 2023-04-16 LAB — HIV ANTIBODY (ROUTINE TESTING W REFLEX): HIV 1&2 Ab, 4th Generation: NONREACTIVE

## 2023-04-19 ENCOUNTER — Telehealth: Payer: Self-pay

## 2023-04-19 NOTE — Telephone Encounter (Signed)
Daughter returned our call. Shared provider's note.  Berniece Salines, FNP 04/19/2023  2:15 PM EDT     Steward Drone, your kidney function is down a little bit.  Try and avoid NSAIDs like ibuprofen, aleve, advil.  Liver function and blood counts are normal. Cholesterol is high but we addressed this at you appointment and started you on medication.   No questions at this time.

## 2023-04-26 ENCOUNTER — Other Ambulatory Visit: Payer: Self-pay | Admitting: Physician Assistant

## 2023-04-26 DIAGNOSIS — I10 Essential (primary) hypertension: Secondary | ICD-10-CM

## 2023-04-28 NOTE — Telephone Encounter (Signed)
Requested Prescriptions  Pending Prescriptions Disp Refills   enalapril (VASOTEC) 20 MG tablet [Pharmacy Med Name: ENALAPRIL MALEATE 20 MG TAB] 90 tablet 0    Sig: TAKE 1 TABLET BY MOUTH ONCE DAILY     Cardiovascular:  ACE Inhibitors Failed - 04/26/2023  2:58 PM      Failed - Cr in normal range and within 180 days    Creat  Date Value Ref Range Status  04/15/2023 1.05 (H) 0.60 - 1.00 mg/dL Final         Passed - K in normal range and within 180 days    Potassium  Date Value Ref Range Status  04/15/2023 4.5 3.5 - 5.3 mmol/L Final         Passed - Patient is not pregnant      Passed - Last BP in normal range    BP Readings from Last 1 Encounters:  04/15/23 134/78         Passed - Valid encounter within last 6 months    Recent Outpatient Visits           1 week ago Primary hypertension   [facility] Berniece Salines, FNP   1 month ago Agitation   [facility] Mecum, Oswaldo Conroy, PA-C   1 year ago Chronic bilateral low back pain without sciatica   [facility] Berniece Salines, FNP   1 year ago Primary hypertension   [facility] Berniece Salines, [place]

## 2023-05-21 ENCOUNTER — Other Ambulatory Visit: Payer: Self-pay | Admitting: Physician Assistant

## 2023-05-21 DIAGNOSIS — R451 Restlessness and agitation: Secondary | ICD-10-CM

## 2023-05-21 NOTE — Telephone Encounter (Signed)
Requested medications are due for refill today.  no  Requested medications are on the active medications list.  yes  Last refill. 03/03/2023 #30 3 rf  Future visit scheduled.   no  Notes to clinic.  Refill/refusal are not delegated.    Requested Prescriptions  Pending Prescriptions Disp Refills   QUEtiapine (SEROQUEL) 25 MG tablet [Pharmacy Med Name: QUETIAPINE FUMARATE 25 MG TAB] 30 tablet 3    Sig: TAKE 1 TABLET BY MOUTH AT BEDTIME     Not Delegated - Psychiatry:  Antipsychotics - Second Generation (Atypical) - quetiapine Failed - 05/21/2023 11:46 AM      Failed - This refill cannot be delegated      Failed - TSH in normal range and within 360 days    No results found for: "TSH", "POCTSH", "TSHREFLEX"       Failed - Lipid Panel in normal range within the last 12 months    Cholesterol  Date Value Ref Range Status  04/15/2023 292 (H) <200 mg/dL Final   LDL Cholesterol (Calc)  Date Value Ref Range Status  04/15/2023 200 (H) mg/dL (calc) Final    Comment:    LDL-C levels > or = 190 mg/dL may indicate familial  hypercholesterolemia (FH). Clinical assessment and  measurement of blood lipid levels should be  considered for all first degree relatives of patients with an FH diagnosis. LDL Cholesterol (LDL-C) levels > or = 300 mg/dL may indicate homozygous familial hypercholesterolemia (HoFH). Untreated,  these extremely high LDL-C levels can result in premature CV events and mortality. Patients should be identified early and provided appropriate interventions to reduce the cumulative LDL-C burden from birth. . For questions about testing for familial hypercholesterolemia, please call Engineer, materials at 1.866.GENE.INFO. Wardell Honour, et al. J National Lipid Association  Recommendations for Patient-Centered Management of Dyslipidemia: Part 1 Journal of  Clinical Lipidology 2015;9(2), 129-169. Cuchel, M. et al. (2014). Homozygous  familial hypercholesterolaemia: new insights and guidance for clinicians to improve detection and clinical management. European Heart Journal, 35(32), [phone]. Reference range: <100 . Desirable range <100 mg/dL for primary prevention;   <70 mg/dL for patients with CHD or diabetic patients  with > or = 2 CHD risk factors. Marland Kitchen LDL-C is now calculated using the Martin-Hopkins  calculation, which is a validated novel method providing  better accuracy than the Friedewald equation in the  estimation of LDL-C.  Horald Pollen et al. Lenox Ahr. 5409;811(91): 2061-2068  (http://education.QuestDiagnostics.com/faq/FAQ164)    HDL  Date Value Ref Range Status  04/15/2023 69 > OR = 50 mg/dL Final   Triglycerides  Date Value Ref Range Status  04/15/2023 99 <150 mg/dL Final         Failed - CMP within normal limits and completed in the last 12 months    Albumin  Date Value Ref Range Status  04/14/2018 4.5 3.5 - 5.0 g/dL Final   Alkaline Phosphatase  Date Value Ref Range Status  04/14/2018 79 38 - 126 U/L Final   Alkaline phosphatase (APISO)  Date Value Ref Range Status  04/15/2023 85 37 - 153 U/L Final   ALT  Date Value Ref Range Status  04/15/2023 9 6 - 29 U/L Final   AST  Date Value Ref Range Status  04/15/2023 15 10 - 35 U/L Final   BUN  Date Value Ref Range Status  04/15/2023 18 7 - 25 mg/dL Final   Calcium  Date Value Ref Range Status  04/15/2023 9.6 8.6 - 10.4 mg/dL Final  CO2  Date Value Ref Range Status  04/15/2023 26 20 - 32 mmol/L Final   Creat  Date Value Ref Range Status  04/15/2023 1.05 (H) 0.60 - 1.00 mg/dL Final   Glucose, Bld  Date Value Ref Range Status  04/15/2023 90 65 - 99 mg/dL Final    Comment:    .            Fasting reference interval .    Potassium  Date Value Ref Range Status  04/15/2023 4.5 3.5 - 5.3 mmol/L Final   Sodium  Date Value Ref Range Status  04/15/2023 143 135 - 146 mmol/L Final   Total Bilirubin  Date Value Ref Range  Status  04/15/2023 0.4 0.2 - 1.2 mg/dL Final   Total Protein  Date Value Ref Range Status  04/15/2023 7.0 6.1 - 8.1 g/dL Final   GFR calc Af Amer  Date Value Ref Range Status  08/06/2019 >60 >60 mL/min Final   eGFR  Date Value Ref Range Status  04/15/2023 54 (L) > OR = 60 mL/min/1.46m2 Final   GFR calc non Af Amer  Date Value Ref Range Status  08/06/2019 >60 >60 mL/min Final         Passed - Completed PHQ-2 or PHQ-9 in the last 360 days      Passed - Last BP in normal range    BP Readings from Last 1 Encounters:  04/15/23 134/78         Passed - Last Heart Rate in normal range    Pulse Readings from Last 1 Encounters:  04/15/23 92         Passed - Valid encounter within last 6 months    Recent Outpatient Visits           1 month ago Primary hypertension   [facility] Berniece Salines, FNP   2 months ago Agitation   [facility] Mecum, Oswaldo Conroy, PA-C   1 year ago Chronic bilateral low back pain without sciatica   [facility] Berniece Salines, FNP   1 year ago Primary hypertension   [facility] Berniece Salines, FNP              Passed - CBC within normal limits and completed in the last 12 months    WBC  Date Value Ref Range Status  04/15/2023 8.9 3.8 - 10.8 Thousand/uL Final   RBC  Date Value Ref Range Status  04/15/2023 4.48 3.80 - 5.10 Million/uL Final   Hemoglobin  Date Value Ref Range Status  04/15/2023 13.2 11.7 - 15.5 g/dL Final   HCT  Date Value Ref Range Status  04/15/2023 40.1 35.0 - 45.0 % Final   MCHC  Date Value Ref Range Status  04/15/2023 32.9 32.0 - 36.0 g/dL Final   [facility]  Date Value Ref Range Status  04/15/2023 29.5 27.0 - 33.0 pg Final   MCV  Date Value Ref Range Status  04/15/2023 89.5 80.0 - 100.0 fL Final   No results found for: "PLTCOUNTKUC", "LABPLAT", "POCPLA" RDW  Date Value Ref Range Status  04/15/2023 13.4  11.0 - 15.0 % Final

## 2023-05-31 ENCOUNTER — Ambulatory Visit: Payer: Medicare (Managed Care) | Admitting: Nurse Practitioner

## 2023-05-31 ENCOUNTER — Ambulatory Visit
Admission: RE | Admit: 2023-05-31 | Discharge: 2023-05-31 | Disposition: A | Discharge status: Home / Self Care | Payer: Medicare (Managed Care) | Source: Ambulatory Visit | Attending: Nurse Practitioner | Admitting: Nurse Practitioner

## 2023-05-31 ENCOUNTER — Encounter: Payer: Self-pay | Admitting: Nurse Practitioner

## 2023-05-31 ENCOUNTER — Ambulatory Visit
Admission: RE | Admit: 2023-05-31 | Discharge: 2023-05-31 | Disposition: A | Discharge status: Home / Self Care | Payer: Medicare (Managed Care) | Attending: Nurse Practitioner | Admitting: Nurse Practitioner

## 2023-05-31 ENCOUNTER — Other Ambulatory Visit: Payer: Self-pay

## 2023-05-31 VITALS — BP 138/78 | HR 84 | Temp 97.7°F | Resp 16 | Ht 63.0 in | Wt 95.2 lb

## 2023-05-31 DIAGNOSIS — R051 Acute cough: Secondary | ICD-10-CM

## 2023-05-31 DIAGNOSIS — R350 Frequency of micturition: Secondary | ICD-10-CM | POA: Diagnosis not present

## 2023-05-31 DIAGNOSIS — K5904 Chronic idiopathic constipation: Secondary | ICD-10-CM | POA: Diagnosis not present

## 2023-05-31 LAB — POCT URINALYSIS DIPSTICK
Bilirubin, UA: NEGATIVE
Glucose, UA: NEGATIVE
Ketones, UA: NEGATIVE
Nitrite, UA: POSITIVE
Protein, UA: POSITIVE — AB
Spec Grav, UA: 1.02 (ref 1.010–1.025)
Urobilinogen, UA: 0.2 E.U./dL
pH, UA: 6 (ref 5.0–8.0)

## 2023-05-31 MED ORDER — AZITHROMYCIN 250 MG PO TABS
ORAL_TABLET | ORAL | 0 refills | Status: AC
Start: 2023-05-31 — End: 2023-06-05

## 2023-05-31 MED ORDER — DOCUSATE SODIUM 100 MG PO CAPS
100.0000 mg | ORAL_CAPSULE | Freq: Every day | ORAL | 1 refills | Status: DC | PRN
Start: 2023-05-31 — End: 2024-02-01

## 2023-05-31 MED ORDER — BENZONATATE 100 MG PO CAPS
200.0000 mg | ORAL_CAPSULE | Freq: Two times a day (BID) | ORAL | 0 refills | Status: DC | PRN
Start: 2023-05-31 — End: 2024-02-01

## 2023-05-31 MED ORDER — AMOXICILLIN-POT CLAVULANATE 875-125 MG PO TABS
1.0000 | ORAL_TABLET | Freq: Two times a day (BID) | ORAL | 0 refills | Status: AC
Start: 2023-05-31 — End: 2023-06-05

## 2023-05-31 MED ORDER — PROMETHAZINE-DM 6.25-15 MG/5ML PO SYRP
5.0000 mL | ORAL_SOLUTION | Freq: Every evening | ORAL | 0 refills | Status: DC | PRN
Start: 2023-05-31 — End: 2024-02-01

## 2023-05-31 MED ORDER — SENNA 8.6 MG PO TABS
1.0000 | ORAL_TABLET | Freq: Every day | ORAL | 1 refills | Status: DC | PRN
Start: 2023-05-31 — End: 2024-02-01

## 2023-05-31 NOTE — Progress Notes (Signed)
BP 138/78   Pulse 84   Temp 97.7 F (36.5 C) (Oral)   Resp 16   Ht 5\' 3"  (1.6 m)   Wt 95 lb 3.2 oz (43.2 kg)   SpO2 97%   BMI 16.86 kg/m    Subjective:    Patient ID: Katherine Price, female    DOB: 18-Jun-1946, 77 y.o.   MRN: [id number]  HPI: Katherine Price is a 77 y.o. female  Chief Complaint  Patient presents with   Cough    For 3 days worst at night   Urinary Frequency   Constipation   Cough: patient reports she has had a cough for three days. The cough is worse at night.  She denies any fever or shortness of breath.  Upon exam rhonchi noted in left upper lobe.  Will get xray.  Start azithromycin and Augmentin.   Urinary frequency: she reports urinary frequency, for the last few days.  Urine has strong odor.  She denies any fever.  Urine dip positive for leukocytes, nitrates, protein, blood. Will send urine for culture and start Augmentin, to also cover for cap.   Constipation: patient reports she occasionally has constipation and needs a prn order for stool softeners.  Senna and colace sent in.    Relevant past medical, surgical, family and social history reviewed and updated as indicated. Interim medical history since our last visit reviewed. Allergies and medications reviewed and updated.  Review of Systems  Constitutional: Negative for fever or weight change.  Respiratory: positive for cough and negative for shortness of breath.   Cardiovascular: Negative for chest pain or palpitations.  Gastrointestinal: Negative for abdominal pain, constipation GU: positive for urinary frequency Musculoskeletal: Negative for gait problem or joint swelling.  Skin: Negative for rash.  Neurological: Negative for dizziness or headache.  No other specific complaints in a complete review of systems (except as listed in HPI above).      Objective:    BP 138/78   Pulse 84   Temp 97.7 F (36.5 C) (Oral)   Resp 16   Ht 5\' 3"  (1.6 m)   Wt 95 lb 3.2 oz (43.2 kg)   SpO2 97%    BMI 16.86 kg/m   Wt Readings from Last 3 Encounters:  05/31/23 95 lb 3.2 oz (43.2 kg)  04/15/23 100 lb (45.4 kg)  04/15/23 100 lb 3.2 oz (45.5 kg)    Physical Exam  Constitutional: Patient appears well-developed and well-nourished. No distress.  HEENT: head atraumatic, normocephalic, pupils equal and reactive to light, ears TMs clear, neck supple, throat within normal limits Cardiovascular: Normal rate, regular rhythm and normal heart sounds.  No murmur heard. No BLE edema. Pulmonary/Chest: Effort normal and breath sounds normal, except for Left upper lobe rhonchi. No respiratory distress. Abdominal: Soft.  There is no tenderness. No CVA tenderness Psychiatric: Patient has a normal mood and affect. behavior is normal. Judgment and thought content normal.   Results for orders placed or performed in visit on 05/31/23  POCT urinalysis dipstick  Result Value Ref Range   Color, UA gold    Clarity, UA cloudy    Glucose, UA Negative Negative   Bilirubin, UA negative    Ketones, UA negative    Spec Grav, UA 1.020 1.010 - 1.025   Blood, UA large    pH, UA 6.0 5.0 - 8.0   Protein, UA Positive (A) Negative   Urobilinogen, UA 0.2 0.2 or 1.0 E.U./dL   Nitrite, UA positive  Leukocytes, UA Large (3+) (A) Negative   Appearance cloudy    Odor strong       Assessment & Plan:   Problem List Items Addressed This Visit   None Visit Diagnoses     Urinary frequency    -  Primary   start augmentin, urine sent for culture   Relevant Medications   amoxicillin-clavulanate (AUGMENTIN) 875-125 MG tablet   Other Relevant Orders   POCT urinalysis dipstick (Completed)   Urine Culture   Acute cough       lung sounds suspicious for pneumonia, will get chest xray and start patient on zpack. also will start patient on augmentin to treat uti and ? pneumonia   Relevant Medications   benzonatate (TESSALON) 100 MG capsule   promethazine-dextromethorphan (PROMETHAZINE-DM) 6.25-15 MG/5ML syrup    azithromycin (ZITHROMAX) 250 MG tablet   amoxicillin-clavulanate (AUGMENTIN) 875-125 MG tablet   Other Relevant Orders   DG Chest 2 View   Chronic idiopathic constipation       Relevant Medications   senna (SENOKOT) 8.6 MG TABS tablet   docusate sodium (COLACE) 100 MG capsule        Follow up plan: Return if symptoms worsen or fail to improve.

## 2023-06-03 LAB — URINE CULTURE
MICRO NUMBER:: 15151722
SPECIMEN QUALITY:: ADEQUATE

## 2023-06-25 ENCOUNTER — Other Ambulatory Visit: Payer: Self-pay | Admitting: Nurse Practitioner

## 2023-06-25 DIAGNOSIS — I10 Essential (primary) hypertension: Secondary | ICD-10-CM

## 2023-06-25 NOTE — Telephone Encounter (Signed)
Requested Prescriptions  Pending Prescriptions Disp Refills   enalapril (VASOTEC) 20 MG tablet [Pharmacy Med Name: ENALAPRIL MALEATE 20 MG TAB] 90 tablet 1    Sig: TAKE 1 TABLET BY MOUTH ONCE DAILY     Cardiovascular:  ACE Inhibitors Failed - 06/25/2023  3:59 PM      Failed - Cr in normal range and within 180 days    Creat  Date Value Ref Range Status  04/15/2023 1.05 (H) 0.60 - 1.00 mg/dL Final         Passed - K in normal range and within 180 days    Potassium  Date Value Ref Range Status  04/15/2023 4.5 3.5 - 5.3 mmol/L Final         Passed - Patient is not pregnant      Passed - Last BP in normal range    BP Readings from Last 1 Encounters:  05/31/23 138/78         Passed - Valid encounter within last 6 months    Recent Outpatient Visits           3 weeks ago Urinary frequency   [facility] Berniece Salines, FNP   2 months ago Primary hypertension   [facility] Berniece Salines, FNP   3 months ago Agitation   [facility] Mecum, Oswaldo Conroy, PA-C   1 year ago Chronic bilateral low back pain without sciatica   [facility] Berniece Salines, FNP   1 year ago Primary hypertension   [facility] Berniece Salines, [place]

## 2023-07-10 ENCOUNTER — Inpatient Hospital Stay
Admission: EM | Admit: 2023-07-10 | Discharge: 2023-07-12 | DRG: 064 | Disposition: A | Discharge status: Home Health | Payer: Medicare (Managed Care) | Attending: Internal Medicine | Admitting: Internal Medicine

## 2023-07-10 ENCOUNTER — Other Ambulatory Visit: Payer: Self-pay

## 2023-07-10 ENCOUNTER — Emergency Department: Payer: Medicare (Managed Care)

## 2023-07-10 DIAGNOSIS — N1831 Chronic kidney disease, stage 3a: Secondary | ICD-10-CM | POA: Diagnosis present

## 2023-07-10 DIAGNOSIS — N390 Urinary tract infection, site not specified: Secondary | ICD-10-CM | POA: Diagnosis present

## 2023-07-10 DIAGNOSIS — J449 Chronic obstructive pulmonary disease, unspecified: Secondary | ICD-10-CM | POA: Diagnosis present

## 2023-07-10 DIAGNOSIS — I10 Essential (primary) hypertension: Secondary | ICD-10-CM | POA: Diagnosis present

## 2023-07-10 DIAGNOSIS — E785 Hyperlipidemia, unspecified: Secondary | ICD-10-CM | POA: Diagnosis present

## 2023-07-10 DIAGNOSIS — E876 Hypokalemia: Secondary | ICD-10-CM | POA: Diagnosis present

## 2023-07-10 DIAGNOSIS — R4182 Altered mental status, unspecified: Secondary | ICD-10-CM | POA: Diagnosis not present

## 2023-07-10 DIAGNOSIS — F17210 Nicotine dependence, cigarettes, uncomplicated: Secondary | ICD-10-CM | POA: Diagnosis present

## 2023-07-10 DIAGNOSIS — F419 Anxiety disorder, unspecified: Secondary | ICD-10-CM | POA: Diagnosis present

## 2023-07-10 DIAGNOSIS — R471 Dysarthria and anarthria: Secondary | ICD-10-CM | POA: Diagnosis present

## 2023-07-10 DIAGNOSIS — M549 Dorsalgia, unspecified: Secondary | ICD-10-CM | POA: Diagnosis present

## 2023-07-10 DIAGNOSIS — M1990 Unspecified osteoarthritis, unspecified site: Secondary | ICD-10-CM | POA: Diagnosis present

## 2023-07-10 DIAGNOSIS — I129 Hypertensive chronic kidney disease with stage 1 through stage 4 chronic kidney disease, or unspecified chronic kidney disease: Secondary | ICD-10-CM | POA: Diagnosis present

## 2023-07-10 DIAGNOSIS — Z681 Body mass index (BMI) 19 or less, adult: Secondary | ICD-10-CM

## 2023-07-10 DIAGNOSIS — I639 Cerebral infarction, unspecified: Secondary | ICD-10-CM | POA: Diagnosis present

## 2023-07-10 DIAGNOSIS — I6381 Other cerebral infarction due to occlusion or stenosis of small artery: Principal | ICD-10-CM | POA: Diagnosis present

## 2023-07-10 DIAGNOSIS — R29703 NIHSS score 3: Secondary | ICD-10-CM | POA: Diagnosis present

## 2023-07-10 DIAGNOSIS — E440 Moderate protein-calorie malnutrition: Secondary | ICD-10-CM | POA: Diagnosis present

## 2023-07-10 DIAGNOSIS — I2510 Atherosclerotic heart disease of native coronary artery without angina pectoris: Secondary | ICD-10-CM | POA: Diagnosis present

## 2023-07-10 DIAGNOSIS — Z951 Presence of aortocoronary bypass graft: Secondary | ICD-10-CM

## 2023-07-10 DIAGNOSIS — I252 Old myocardial infarction: Secondary | ICD-10-CM

## 2023-07-10 DIAGNOSIS — F32A Depression, unspecified: Secondary | ICD-10-CM | POA: Diagnosis present

## 2023-07-10 DIAGNOSIS — Z90710 Acquired absence of both cervix and uterus: Secondary | ICD-10-CM

## 2023-07-10 DIAGNOSIS — J4489 Other specified chronic obstructive pulmonary disease: Secondary | ICD-10-CM | POA: Diagnosis present

## 2023-07-10 DIAGNOSIS — G8929 Other chronic pain: Secondary | ICD-10-CM | POA: Diagnosis present

## 2023-07-10 DIAGNOSIS — U071 COVID-19: Secondary | ICD-10-CM | POA: Diagnosis present

## 2023-07-10 DIAGNOSIS — Z79899 Other long term (current) drug therapy: Secondary | ICD-10-CM

## 2023-07-10 LAB — URINALYSIS, W/ REFLEX TO CULTURE (INFECTION SUSPECTED)
Bilirubin Urine: NEGATIVE
Glucose, UA: NEGATIVE mg/dL
Ketones, ur: NEGATIVE mg/dL
Nitrite: NEGATIVE
Protein, ur: 30 mg/dL — AB
Specific Gravity, Urine: 1.018 (ref 1.005–1.030)
pH: 5 (ref 5.0–8.0)

## 2023-07-10 LAB — LACTIC ACID, PLASMA: Lactic Acid, Venous: 0.9 mmol/L (ref 0.5–1.9)

## 2023-07-10 LAB — CBC WITH DIFFERENTIAL/PLATELET
Abs Immature Granulocytes: 0.01 10*3/uL (ref 0.00–0.07)
Basophils Absolute: 0.1 10*3/uL (ref 0.0–0.1)
Basophils Relative: 1 %
Eosinophils Absolute: 0 10*3/uL (ref 0.0–0.5)
Eosinophils Relative: 0 %
HCT: 35.3 % — ABNORMAL LOW (ref 36.0–46.0)
Hemoglobin: 12 g/dL (ref 12.0–15.0)
Immature Granulocytes: 0 %
Lymphocytes Relative: 37 %
Lymphs Abs: 1.7 10*3/uL (ref 0.7–4.0)
MCH: 30.1 pg (ref 26.0–34.0)
MCHC: 34 g/dL (ref 30.0–36.0)
MCV: 88.5 fL (ref 80.0–100.0)
Monocytes Absolute: 0.5 10*3/uL (ref 0.1–1.0)
Monocytes Relative: 10 %
Neutro Abs: 2.4 10*3/uL (ref 1.7–7.7)
Neutrophils Relative %: 52 %
Platelets: 241 10*3/uL (ref 150–400)
RBC: 3.99 MIL/uL (ref 3.87–5.11)
RDW: 14.6 % (ref 11.5–15.5)
WBC: 4.7 10*3/uL (ref 4.0–10.5)
nRBC: 0 % (ref 0.0–0.2)

## 2023-07-10 LAB — COMPREHENSIVE METABOLIC PANEL
ALT: 17 U/L (ref 0–44)
AST: 26 U/L (ref 15–41)
Albumin: 3.6 g/dL (ref 3.5–5.0)
Alkaline Phosphatase: 63 U/L (ref 38–126)
Anion gap: 8 (ref 5–15)
BUN: 31 mg/dL — ABNORMAL HIGH (ref 8–23)
CO2: 24 mmol/L (ref 22–32)
Calcium: 8.1 mg/dL — ABNORMAL LOW (ref 8.9–10.3)
Chloride: 107 mmol/L (ref 98–111)
Creatinine, Ser: 1.31 mg/dL — ABNORMAL HIGH (ref 0.44–1.00)
GFR, Estimated: 41 mL/min — ABNORMAL LOW (ref >60)
Glucose, Bld: 104 mg/dL — ABNORMAL HIGH (ref 70–99)
Potassium: 3.2 mmol/L — ABNORMAL LOW (ref 3.5–5.1)
Sodium: 139 mmol/L (ref 135–145)
Total Bilirubin: 0.5 mg/dL (ref 0.3–1.2)
Total Protein: 6.2 g/dL — ABNORMAL LOW (ref 6.5–8.1)

## 2023-07-10 LAB — SARS CORONAVIRUS 2 BY RT PCR: SARS Coronavirus 2 by RT PCR: POSITIVE — AB

## 2023-07-10 MED ORDER — SODIUM CHLORIDE 0.9 % IV SOLN
1.0000 g | Freq: Once | INTRAVENOUS | Status: AC
  Administered 2023-07-11: 1 g via INTRAVENOUS
  Filled 2023-07-10: qty 10

## 2023-07-10 MED ORDER — SODIUM CHLORIDE 0.9 % IV BOLUS
500.0000 mL | Freq: Once | INTRAVENOUS | Status: AC
  Administered 2023-07-10: 500 mL via INTRAVENOUS

## 2023-07-10 NOTE — ED Provider Notes (Signed)
[facility] Provider Note    Event Date/Time   First MD Initiated Contact with Patient 07/10/23 1938     (approximate)   History   AMS  HPI  GAI Katherine Price is a 77 y.o. female who presents to the emergency department from living facility because of concerns for altered mental status.  The patient is unable to give any history as to what happened tonight.  Family at bedside state that she seems to be at her baseline mental status.  They last talked her about a week ago when she was again at her baseline status.     Physical Exam   Triage Vital Signs: ED Triage Vitals  Encounter Vitals Group     BP 07/10/23 1952 139/78     Systolic BP Percentile --      Diastolic BP Percentile --      Pulse Rate 07/10/23 1952 85     Resp 07/10/23 1952 18     Temp 07/10/23 1952 98 F (36.7 C)     Temp src --      SpO2 07/10/23 1952 98 %     Weight 07/10/23 1943 95 lb 10.9 oz (43.4 kg)     Height 07/10/23 1943 5\' 3"  (1.6 m)     Head Circumference --      Peak Flow --      Pain Score 07/10/23 1943 0     Pain Loc --      Pain Education --      Exclude from Growth Chart --     Most recent vital signs: Vitals:   07/10/23 1952  BP: 139/78  Pulse: 85  Resp: 18  Temp: 98 F (36.7 C)  SpO2: 98%   General: Awake, alert, not completely oriented. CV:  Good peripheral perfusion. Regular rate and rhythm. Resp:  Normal effort. Lungs clear. Abd:  No distention.    ED Results / Procedures / Treatments   Labs (all labs ordered are listed, but only abnormal results are displayed) Labs Reviewed  COMPREHENSIVE METABOLIC PANEL - Abnormal; Notable for the following components:      Result Value   Potassium 3.2 (*)    Glucose, Bld 104 (*)    BUN 31 (*)    Creatinine, Ser 1.31 (*)    Calcium 8.1 (*)    Total Protein 6.2 (*)    GFR, Estimated 41 (*)    All other components within normal limits  CBC WITH DIFFERENTIAL/PLATELET - Abnormal; Notable for the following  components:   HCT 35.3 (*)    All other components within normal limits  CULTURE, BLOOD (ROUTINE X 2)  CULTURE, BLOOD (ROUTINE X 2)  SARS CORONAVIRUS 2 BY RT PCR  LACTIC ACID, PLASMA  LACTIC ACID, PLASMA  URINALYSIS, W/ REFLEX TO CULTURE (INFECTION SUSPECTED)     EKG  I, Phineas Semen, attending physician, personally viewed and interpreted this EKG  EKG Time: 1943 Rate: 96 Rhythm: sinus rhythm with frequent PAC Axis: normal Intervals: qtc 428 QRS: narrow, LVH ST changes: no st elevation Impression: abnormal ekg   RADIOLOGY I independently interpreted and visualized the CT head. My interpretation: No bleed Radiology interpretation: ***    PROCEDURES:  Critical Care performed: Yes  CRITICAL CARE Performed by: Phineas Semen   Total critical care time: *** minutes  Critical care time was exclusive of separately billable procedures and treating other patients.  Critical care was necessary to treat or prevent imminent or life-threatening deterioration.  Critical care was time spent personally by me on the following activities: development of treatment plan with patient and/or surrogate as well as nursing, discussions with consultants, evaluation of patient's response to treatment, examination of patient, obtaining history from patient or surrogate, ordering and performing treatments and interventions, ordering and review of laboratory studies, ordering and review of radiographic studies, pulse oximetry and re-evaluation of patient's condition.   Procedures    MEDICATIONS ORDERED IN ED: Medications - No data to display   IMPRESSION / MDM / ASSESSMENT AND PLAN / ED COURSE  I reviewed the triage vital signs and the nursing notes.                              Differential diagnosis includes, but is not limited to, ***  Patient's presentation is most consistent with {EM COPA:27473}   ***The patient is on the cardiac monitor to evaluate for evidence of  arrhythmia and/or significant heart rate changes.  ***      FINAL CLINICAL IMPRESSION(S) / ED DIAGNOSES   Final diagnoses:  None     Rx / DC Orders   ED Discharge Orders     None        Note:  This document was prepared using Dragon voice recognition software and may include unintentional dictation errors.

## 2023-07-10 NOTE — H&P (Signed)
History and Physical    [name] KGU:[id number] DOB: [birth year] DOA: 07/10/2023  Referring MD/NP/PA:   PCP: Berniece Salines, FNP   Patient coming from:  The patient is coming from home.     Chief Complaint:   HPI: Katherine Price is a 77 y.o. female with medical history significant of      Data reviewed independently and ED Course: pt was found to have     ***       EKG: I have personally reviewed.  Not done in ED, will get one.   ***   Review of Systems:   General: no fevers, chills, no body weight gain, has poor appetite, has fatigue HEENT: no blurry vision, hearing changes or sore throat Respiratory: no dyspnea, coughing, wheezing CV: no chest pain, no palpitations GI: no nausea, vomiting, abdominal pain, diarrhea, constipation GU: no dysuria, burning on urination, increased urinary frequency, hematuria  Ext: no leg edema Neuro: no unilateral weakness, numbness, or tingling, no vision change or hearing loss Skin: no rash, no skin tear. MSK: No muscle spasm, no deformity, no limitation of range of movement in spin Heme: No easy bruising.  Travel history: No recent long distant travel.   Allergy: No Known Allergies  Past Medical History:  Diagnosis Date   Anxiety    Arthritis    Chronic back pain    Chronic bronchitis (HCC)    COPD (chronic obstructive pulmonary disease) (HCC)    Coronary artery disease    Depression    Dyspnea    orthopnea   Heart murmur    History of peptic ulcer    Hyperlipidemia    Hypertension    Myocardial infarction (HCC)    Orthopnea    Pneumonia    no to pneumonia but yes to chronic bronchitis   Renal insufficiency    Wheezing     Past Surgical History:  Procedure Laterality Date   ABDOMINAL HYSTERECTOMY     BLADDER SUSPENSION     CATARACT EXTRACTION W/PHACO Right 09/14/2019   Procedure: CATARACT EXTRACTION PHACO AND INTRAOCULAR LENS PLACEMENT (IOC), RIGHT;  Surgeon: Elliot Cousin, MD;  Location: [facility] ORS;   Service: Ophthalmology;  Laterality: Right;  Lot #[id number] H [place]: 01:10.7 CDE; 12.49   CATARACT EXTRACTION W/PHACO Left 10/19/2019   Procedure: CATARACT EXTRACTION PHACO AND INTRAOCULAR LENS PLACEMENT (IOC) LEFT VISION BLUE;  Surgeon: Elliot Cousin, MD;  Location: [facility] ORS;  Service: Ophthalmology;  Laterality: Left;  [place] 01:19.8 CDE 11.74 Fluid Pack Lot [id number]   CORONARY ARTERY BYPASS GRAFT  2003   LACERATION REPAIR     of forehead   TIBIA FRACTURE SURGERY     rod implanted    Social History:  reports that she quit smoking about 14 months ago. Her smoking use included cigarettes. She has never used smokeless tobacco. She reports that she does not drink alcohol and does not use drugs.  Family History: History reviewed. No pertinent family history.   Prior to Admission medications   Medication Sig Start Date End Date Taking? Authorizing Provider  acetaminophen (TYLENOL) 325 MG tablet Take 2 tablets (650 mg total) by mouth every 6 (six) hours as needed for mild pain. 02/12/22   Berniece Salines, FNP  atorvastatin (LIPITOR) 10 MG tablet Take 1 tablet (10 mg total) by mouth daily. 04/15/23   Berniece Salines, FNP  benzonatate (TESSALON) 100 MG capsule Take 2 capsules (200 mg total) by mouth 2 (two) times daily as needed for cough. 05/31/23  Berniece Salines, FNP  docusate sodium (COLACE) 100 MG capsule Take 1 capsule (100 mg total) by mouth daily as needed for mild constipation. 05/31/23   Berniece Salines, FNP  enalapril (VASOTEC) 20 MG tablet TAKE 1 TABLET BY MOUTH ONCE DAILY 06/25/23   Berniece Salines, FNP  promethazine-dextromethorphan (PROMETHAZINE-DM) 6.25-15 MG/5ML syrup Take 5 mLs by mouth at bedtime as needed for cough. 05/31/23   Berniece Salines, FNP  QUEtiapine (SEROQUEL) 25 MG tablet TAKE 1 TABLET BY MOUTH AT BEDTIME 05/22/23   Berniece Salines, FNP  senna (SENOKOT) 8.6 MG TABS tablet Take 1 tablet (8.6 mg total) by mouth daily as needed for mild constipation. Until having soft daily stools  05/31/23   Berniece Salines, FNP    Physical Exam: Vitals:   07/10/23 2130 07/10/23 2145 07/10/23 2200 07/10/23 2233  BP:    (!) 169/68  Pulse: 75 70 69 73  Resp: (!) 21 (!) 23 (!) 22 19  Temp:      SpO2: 95% 95% 92% 98%  Weight:      Height:       General: Not in acute distress HEENT:       Eyes: PERRL, EOMI, no jaundice       ENT: No discharge from the ears and nose, no pharynx injection, no tonsillar enlargement.        Neck: No JVD, no bruit, no mass felt. Heme: No neck lymph node enlargement. Cardiac: S1/S2, RRR, No murmurs, No gallops or rubs. Respiratory: No rales, wheezing, rhonchi or rubs. GI: Soft, nondistended, nontender, no rebound pain, no organomegaly, BS present. GU: No hematuria Ext: No pitting leg edema bilaterally. 1+DP/PT pulse bilaterally. Musculoskeletal: No joint deformities, No joint redness or warmth, no limitation of ROM in spin. Skin: No rashes.  Neuro: Alert, oriented X3, cranial nerves II-XII grossly intact, moves all extremities normally. Muscle strength 5/5 in all extremities, sensation to light touch intact. Brachial reflex 2+ bilaterally. Knee reflex 1+ bilaterally. Negative Babinski's sign. Normal finger to nose test. Psych: Patient is not psychotic, no suicidal or hemocidal ideation.  Labs on Admission: I have personally reviewed following labs and imaging studies  CBC: Recent Labs  Lab 07/10/23 1930  WBC 4.7  NEUTROABS 2.4  HGB 12.0  HCT 35.3*  MCV 88.5  PLT 241   Basic Metabolic Panel: Recent Labs  Lab 07/10/23 1930  NA 139  K 3.2*  CL 107  CO2 24  GLUCOSE 104*  BUN 31*  CREATININE 1.31*  CALCIUM 8.1*   GFR: Estimated Creatinine Clearance: 23.9 mL/min (A) (by C-G formula based on SCr of 1.31 mg/dL (H)). Liver Function Tests: Recent Labs  Lab 07/10/23 1930  AST 26  ALT 17  ALKPHOS 63  BILITOT 0.5  PROT 6.2*  ALBUMIN 3.6   No results for input(s): "LIPASE", "AMYLASE" in the last 168 hours. No results for input(s):  "AMMONIA" in the last 168 hours. Coagulation Profile: No results for input(s): "INR", "PROTIME" in the last 168 hours. Cardiac Enzymes: No results for input(s): "CKTOTAL", "CKMB", "CKMBINDEX", "TROPONINI" in the last 168 hours. BNP (last 3 results) No results for input(s): "PROBNP" in the last 8760 hours. HbA1C: No results for input(s): "HGBA1C" in the last 72 hours. CBG: No results for input(s): "GLUCAP" in the last 168 hours. Lipid Profile: No results for input(s): "CHOL", "HDL", "LDLCALC", "TRIG", "CHOLHDL", "LDLDIRECT" in the last 72 hours. Thyroid Function Tests: No results for input(s): "TSH", "T4TOTAL", "FREET4", "T3FREE", "THYROIDAB" in the last  72 hours. Anemia Panel: No results for input(s): "VITAMINB12", "FOLATE", "FERRITIN", "TIBC", "IRON", "RETICCTPCT" in the last 72 hours. Urine analysis:    Component Value Date/Time   COLORURINE AMBER (A) 07/10/2023 2018   APPEARANCEUR CLOUDY (A) 07/10/2023 2018   LABSPEC 1.018 07/10/2023 2018   PHURINE 5.0 07/10/2023 2018   GLUCOSEU NEGATIVE 07/10/2023 2018   HGBUR MODERATE (A) 07/10/2023 2018   BILIRUBINUR NEGATIVE 07/10/2023 2018   BILIRUBINUR negative 05/31/2023 1024   KETONESUR NEGATIVE 07/10/2023 2018   PROTEINUR 30 (A) 07/10/2023 2018   UROBILINOGEN 0.2 05/31/2023 1024   NITRITE NEGATIVE 07/10/2023 2018   LEUKOCYTESUR MODERATE (A) 07/10/2023 2018   Sepsis Labs: @LABRCNTIP (procalcitonin:4,lacticidven:4) ) Recent Results (from the past 240 hour(s))  SARS Coronavirus 2 by RT PCR (hospital order, performed in [facility] hospital lab) *cepheid single result test* Anterior Nasal Swab     Status: Abnormal   Collection Time: 07/10/23  8:18 PM   Specimen: Anterior Nasal Swab  Result Value Ref Range Status   SARS Coronavirus 2 by RT PCR POSITIVE (A) NEGATIVE Final    Comment: (NOTE) SARS-CoV-2 target nucleic acids are DETECTED  SARS-CoV-2 RNA is generally detectable in upper respiratory specimens  during the acute phase of  infection.  Positive results are indicative  of the presence of the identified virus, but do not rule out bacterial infection or co-infection with other pathogens not detected by the test.  Clinical correlation with patient history and  other diagnostic information is necessary to determine patient infection status.  The expected result is negative.  Fact Sheet for Patients:   RoadLapTop.co.za   Fact Sheet for Healthcare Providers:   http://kim-miller.com/    This test is not yet approved or cleared by the [place] FDA and  has been authorized for detection and/or diagnosis of SARS-CoV-2 by FDA under an Emergency Use Authorization (EUA).  This EUA will remain in effect (meaning this test can be used) for the duration of  the COVID-19 declaration under Section 564(b)(1)  of the Act, 21 U.S.C. section 360-bbb-3(b)(1), unless the authorization is terminated or revoked sooner.   Performed at [facility], [address] [place], [place] [address]      Radiological Exams on Admission: MR BRAIN WO CONTRAST  Result Date: 07/10/2023 CLINICAL DATA:  Altered mental status EXAM: MRI HEAD WITHOUT CONTRAST TECHNIQUE: Multiplanar, multiecho pulse sequences of the brain and surrounding structures were obtained without intravenous contrast. COMPARISON:  None Available. 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 There is multifocal hyperintense T2-weighted signal within the white matter. Generalized volume loss. 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 No acute hemorrhage. The midline structures are normal. Vascular: Major flow voids are preserved. Skull and upper cervical spine: Normal calvarium and skull base. Visualized upper cervical spine and soft tissues are normal. Sinuses/Orbits:No paranasal sinus fluid levels or advanced mucosal thickening. No mastoid or middle ear effusion. Normal orbits.  IMPRESSION: 1. 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 No hemorrhage or mass effect. 2. 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 Electronically Signed   By: Deatra Robinson M.D.   On: 07/10/2023 23:13   DG Chest Port 1 View  Result Date: 07/10/2023 CLINICAL DATA:  Altered mental status EXAM: PORTABLE CHEST 1 VIEW COMPARISON:  05/31/2023 FINDINGS: The heart size and mediastinal contours are within normal limits. Prior sternotomy. Aortic atherosclerosis. Hyperinflated lungs. No focal airspace consolidation, pleural effusion, or pneumothorax. The visualized skeletal structures are  unremarkable. IMPRESSION: No active disease. Electronically Signed   By: Duanne Guess D.O.   On: 07/10/2023 20:49   CT Head Wo Contrast  Result Date: 07/10/2023 CLINICAL DATA:  Altered mental status EXAM: CT HEAD WITHOUT CONTRAST TECHNIQUE: Contiguous axial images were obtained from the base of the skull through the vertex without intravenous contrast. RADIATION DOSE REDUCTION: This exam was performed according to the departmental dose-optimization program which includes automated exposure control, adjustment of the mA and/or kV according to patient size and/or use of iterative reconstruction technique. COMPARISON:  11/11/2021 FINDINGS: Brain: Two small lacunar-type infarcts within the left basal ganglia, favored to be chronic, but new since 2022. Remote right basal ganglia and right periventricular lacunar infarcts are unchanged. No large territory acute infarction. No intracranial hemorrhage, extra-axial collection, or evidence of a mass lesion. Similar ex vacuo dilatation of the right lateral ventricle. Extensive low-density changes within the periventricular and subcortical white matter most compatible with chronic microvascular ischemic change. Mild diffuse cerebral volume loss. Vascular: Atherosclerotic calcifications involving the large vessels of the skull base. No unexpected hyperdense vessel.  Skull: Normal. Negative for fracture or focal lesion. Sinuses/Orbits: No acute finding. Other: None. IMPRESSION: 1. Two small lacunar-type infarcts within the left basal ganglia, favored to be chronic, but new since 2022. If there is clinical concern for acute infarction, consider further evaluation with MRI. 2. Advanced chronic microvascular ischemic change and cerebral volume loss. Electronically Signed   By: Duanne Guess D.O.   On: 07/10/2023 20:43      Assessment/Plan Active Problems:   * No active hospital problems. *   Assessment and Plan: No notes have been filed under this hospital service. Service: Hospitalist      Active Problems:   * No active hospital problems. *    DVT ppx: SQ Heparin         SQ Lovenox  Code Status: Full code   ***  Family Communication: not done, no family member is at bed side.              Yes, patient's    at bed side.       by phone  Disposition Plan:  Anticipate discharge back to previous environment  Consults called:    Admission status and Level of care: :    Med-surg bed for obs as inpt    progressive unit for obs   as inpt      SDU/inpation        {Inpatient:23812}  Dispo: The patient is from: {From:23814}              Anticipated d/c is to: {To:23815}              Anticipated d/c date is: {Days:23816}              Patient currently {Medically stable:23817}    Severity of Illness:  {Observation/Inpatient:21159}       Date of Service 07/10/2023    Lorretta Harp [facility]   If 7PM-7AM, please contact night-coverage [email or web address] 07/10/2023, 11:59 PM

## 2023-07-10 NOTE — ED Notes (Signed)
MD was given EKG and no STEMI on ekg

## 2023-07-10 NOTE — ED Triage Notes (Signed)
Pt coming from  home d/t AMS and lethargy within the last hour. Pt O2 sat was 88% on RA an\d was placed on O2 by ems. Pt has hx of CVA and MI

## 2023-07-11 ENCOUNTER — Inpatient Hospital Stay: Payer: Medicare (Managed Care)

## 2023-07-11 ENCOUNTER — Inpatient Hospital Stay (HOSPITAL_COMMUNITY)
Admit: 2023-07-11 | Discharge: 2023-07-11 | Disposition: A | Discharge status: Home / Self Care | Payer: Medicare (Managed Care) | Attending: Internal Medicine | Admitting: Internal Medicine

## 2023-07-11 DIAGNOSIS — I6389 Other cerebral infarction: Secondary | ICD-10-CM

## 2023-07-11 DIAGNOSIS — R4182 Altered mental status, unspecified: Secondary | ICD-10-CM

## 2023-07-11 DIAGNOSIS — E876 Hypokalemia: Secondary | ICD-10-CM

## 2023-07-11 DIAGNOSIS — I10 Essential (primary) hypertension: Secondary | ICD-10-CM

## 2023-07-11 DIAGNOSIS — Z90710 Acquired absence of both cervix and uterus: Secondary | ICD-10-CM | POA: Diagnosis not present

## 2023-07-11 DIAGNOSIS — U071 COVID-19: Secondary | ICD-10-CM | POA: Diagnosis present

## 2023-07-11 DIAGNOSIS — I129 Hypertensive chronic kidney disease with stage 1 through stage 4 chronic kidney disease, or unspecified chronic kidney disease: Secondary | ICD-10-CM | POA: Diagnosis present

## 2023-07-11 DIAGNOSIS — J4489 Other specified chronic obstructive pulmonary disease: Secondary | ICD-10-CM | POA: Diagnosis present

## 2023-07-11 DIAGNOSIS — I639 Cerebral infarction, unspecified: Secondary | ICD-10-CM | POA: Diagnosis not present

## 2023-07-11 DIAGNOSIS — I252 Old myocardial infarction: Secondary | ICD-10-CM | POA: Diagnosis not present

## 2023-07-11 DIAGNOSIS — E785 Hyperlipidemia, unspecified: Secondary | ICD-10-CM | POA: Diagnosis present

## 2023-07-11 DIAGNOSIS — I2510 Atherosclerotic heart disease of native coronary artery without angina pectoris: Secondary | ICD-10-CM | POA: Diagnosis present

## 2023-07-11 DIAGNOSIS — F32A Depression, unspecified: Secondary | ICD-10-CM

## 2023-07-11 DIAGNOSIS — N3000 Acute cystitis without hematuria: Secondary | ICD-10-CM

## 2023-07-11 DIAGNOSIS — N1831 Chronic kidney disease, stage 3a: Secondary | ICD-10-CM

## 2023-07-11 DIAGNOSIS — E440 Moderate protein-calorie malnutrition: Secondary | ICD-10-CM | POA: Diagnosis present

## 2023-07-11 DIAGNOSIS — J449 Chronic obstructive pulmonary disease, unspecified: Secondary | ICD-10-CM | POA: Diagnosis not present

## 2023-07-11 DIAGNOSIS — Z681 Body mass index (BMI) 19 or less, adult: Secondary | ICD-10-CM | POA: Diagnosis not present

## 2023-07-11 DIAGNOSIS — N390 Urinary tract infection, site not specified: Secondary | ICD-10-CM | POA: Diagnosis present

## 2023-07-11 DIAGNOSIS — M1990 Unspecified osteoarthritis, unspecified site: Secondary | ICD-10-CM | POA: Diagnosis present

## 2023-07-11 DIAGNOSIS — Z951 Presence of aortocoronary bypass graft: Secondary | ICD-10-CM | POA: Diagnosis not present

## 2023-07-11 DIAGNOSIS — I6381 Other cerebral infarction due to occlusion or stenosis of small artery: Secondary | ICD-10-CM | POA: Diagnosis present

## 2023-07-11 DIAGNOSIS — F419 Anxiety disorder, unspecified: Secondary | ICD-10-CM | POA: Diagnosis present

## 2023-07-11 DIAGNOSIS — M549 Dorsalgia, unspecified: Secondary | ICD-10-CM | POA: Diagnosis present

## 2023-07-11 DIAGNOSIS — F17210 Nicotine dependence, cigarettes, uncomplicated: Secondary | ICD-10-CM | POA: Diagnosis present

## 2023-07-11 DIAGNOSIS — R471 Dysarthria and anarthria: Secondary | ICD-10-CM | POA: Diagnosis present

## 2023-07-11 DIAGNOSIS — Z79899 Other long term (current) drug therapy: Secondary | ICD-10-CM | POA: Diagnosis not present

## 2023-07-11 DIAGNOSIS — R29703 NIHSS score 3: Secondary | ICD-10-CM | POA: Diagnosis present

## 2023-07-11 DIAGNOSIS — G8929 Other chronic pain: Secondary | ICD-10-CM | POA: Diagnosis present

## 2023-07-11 LAB — ECHOCARDIOGRAM COMPLETE
AR max vel: 1.08 cm2
AV Area VTI: 1.13 cm2
AV Area mean vel: 1.01 cm2
AV Mean grad: 17.3 mmHg
AV Peak grad: 30.8 mmHg
Ao pk vel: 2.77 m/s
Area-P 1/2: 2.87 cm2
Height: 63 in
Weight: 1530.87 oz

## 2023-07-11 LAB — CBC
HCT: 35.8 % — ABNORMAL LOW (ref 36.0–46.0)
Hemoglobin: 12.2 g/dL (ref 12.0–15.0)
MCH: 29.8 pg (ref 26.0–34.0)
MCHC: 34.1 g/dL (ref 30.0–36.0)
MCV: 87.5 fL (ref 80.0–100.0)
Platelets: 231 10*3/uL (ref 150–400)
RBC: 4.09 MIL/uL (ref 3.87–5.11)
RDW: 14.4 % (ref 11.5–15.5)
WBC: 4.3 10*3/uL (ref 4.0–10.5)
nRBC: 0 % (ref 0.0–0.2)

## 2023-07-11 LAB — URINALYSIS, W/ REFLEX TO CULTURE (INFECTION SUSPECTED)
Bacteria, UA: NONE SEEN
Bilirubin Urine: NEGATIVE
Glucose, UA: NEGATIVE mg/dL
Ketones, ur: 5 mg/dL — AB
Nitrite: NEGATIVE
Protein, ur: NEGATIVE mg/dL
Specific Gravity, Urine: 1.011 (ref 1.005–1.030)
pH: 5 (ref 5.0–8.0)

## 2023-07-11 LAB — BASIC METABOLIC PANEL
Anion gap: 8 (ref 5–15)
BUN: 23 mg/dL (ref 8–23)
CO2: 19 mmol/L — ABNORMAL LOW (ref 22–32)
Calcium: 7.8 mg/dL — ABNORMAL LOW (ref 8.9–10.3)
Chloride: 111 mmol/L (ref 98–111)
Creatinine, Ser: 0.96 mg/dL (ref 0.44–1.00)
GFR, Estimated: >60 mL/min (ref >60)
Glucose, Bld: 96 mg/dL (ref 70–99)
Potassium: 3.3 mmol/L — ABNORMAL LOW (ref 3.5–5.1)
Sodium: 138 mmol/L (ref 135–145)

## 2023-07-11 LAB — LIPID PANEL
Cholesterol: 141 mg/dL (ref 0–200)
HDL: 55 mg/dL (ref >40)
LDL Cholesterol: 76 mg/dL (ref 0–99)
Total CHOL/HDL Ratio: 2.6 RATIO
Triglycerides: 48 mg/dL (ref <150)
VLDL: 10 mg/dL (ref 0–40)

## 2023-07-11 LAB — MAGNESIUM: Magnesium: 2.2 mg/dL (ref 1.7–2.4)

## 2023-07-11 MED ORDER — ENOXAPARIN SODIUM 40 MG/0.4ML IJ SOSY: 40.0000 mg | PREFILLED_SYRINGE | INTRAMUSCULAR | Status: DC

## 2023-07-11 MED ORDER — ENSURE ENLIVE PO LIQD
237.0000 mL | Freq: Three times a day (TID) | ORAL | Status: DC
  Administered 2023-07-11 – 2023-07-12 (×2): 237 mL via ORAL

## 2023-07-11 MED ORDER — SENNOSIDES-DOCUSATE SODIUM 8.6-50 MG PO TABS: 1.0000 | ORAL_TABLET | Freq: Every evening | ORAL | Status: DC | PRN

## 2023-07-11 MED ORDER — ADULT MULTIVITAMIN W/MINERALS CH
1.0000 | ORAL_TABLET | Freq: Every day | ORAL | Status: DC
  Administered 2023-07-11 – 2023-07-12 (×2): 1 via ORAL
  Filled 2023-07-11 (×2): qty 1

## 2023-07-11 MED ORDER — ATORVASTATIN CALCIUM 20 MG PO TABS
10.0000 mg | ORAL_TABLET | Freq: Every day | ORAL | Status: DC
  Administered 2023-07-11: 10 mg via ORAL
  Filled 2023-07-11: qty 1

## 2023-07-11 MED ORDER — ENOXAPARIN SODIUM 30 MG/0.3ML IJ SOSY
30.0000 mg | PREFILLED_SYRINGE | INTRAMUSCULAR | Status: DC
  Administered 2023-07-11 – 2023-07-12 (×2): 30 mg via SUBCUTANEOUS
  Filled 2023-07-11 (×2): qty 0.3

## 2023-07-11 MED ORDER — ATORVASTATIN CALCIUM 20 MG PO TABS
20.0000 mg | ORAL_TABLET | Freq: Every day | ORAL | Status: DC
  Administered 2023-07-12: 20 mg via ORAL
  Filled 2023-07-11: qty 1

## 2023-07-11 MED ORDER — STROKE: EARLY STAGES OF RECOVERY BOOK: Freq: Once | Status: AC

## 2023-07-11 MED ORDER — POTASSIUM CHLORIDE CRYS ER 20 MEQ PO TBCR: 40.0000 meq | EXTENDED_RELEASE_TABLET | Freq: Once | ORAL | Status: DC

## 2023-07-11 MED ORDER — ASPIRIN 325 MG PO TABS
325.0000 mg | ORAL_TABLET | Freq: Every day | ORAL | Status: DC
  Administered 2023-07-11: 325 mg via ORAL
  Filled 2023-07-11 (×2): qty 1

## 2023-07-11 MED ORDER — QUETIAPINE FUMARATE 25 MG PO TABS
25.0000 mg | ORAL_TABLET | Freq: Every day | ORAL | Status: DC
  Administered 2023-07-11: 25 mg via ORAL
  Filled 2023-07-11: qty 1

## 2023-07-11 MED ORDER — ONDANSETRON HCL 4 MG/2ML IJ SOLN: 4.0000 mg | Freq: Three times a day (TID) | INTRAMUSCULAR | Status: DC | PRN

## 2023-07-11 MED ORDER — SODIUM CHLORIDE 0.9 % IV BOLUS
500.0000 mL | Freq: Once | INTRAVENOUS | Status: AC
  Administered 2023-07-11: 500 mL via INTRAVENOUS

## 2023-07-11 MED ORDER — SODIUM CHLORIDE 0.9 % IV SOLN: INTRAVENOUS | Status: DC

## 2023-07-11 MED ORDER — POTASSIUM CHLORIDE 10 MEQ/100ML IV SOLN
10.0000 meq | INTRAVENOUS | Status: AC
  Administered 2023-07-11 (×4): 10 meq via INTRAVENOUS
  Filled 2023-07-11 (×4): qty 100

## 2023-07-11 MED ORDER — ALBUTEROL SULFATE (2.5 MG/3ML) 0.083% IN NEBU: 2.5000 mg | INHALATION_SOLUTION | RESPIRATORY_TRACT | Status: DC | PRN

## 2023-07-11 MED ORDER — ACETAMINOPHEN 650 MG RE SUPP: 650.0000 mg | RECTAL | Status: DC | PRN

## 2023-07-11 MED ORDER — DM-GUAIFENESIN ER 30-600 MG PO TB12: 1.0000 | ORAL_TABLET | Freq: Two times a day (BID) | ORAL | Status: DC | PRN

## 2023-07-11 MED ORDER — HYDRALAZINE HCL 20 MG/ML IJ SOLN: 5.0000 mg | INTRAMUSCULAR | Status: DC | PRN

## 2023-07-11 MED ORDER — ACETAMINOPHEN 160 MG/5ML PO SOLN: 650.0000 mg | ORAL | Status: DC | PRN

## 2023-07-11 MED ORDER — SODIUM CHLORIDE 0.9 % IV SOLN
1.0000 g | INTRAVENOUS | Status: DC
  Administered 2023-07-11: 1 g via INTRAVENOUS
  Filled 2023-07-11: qty 10

## 2023-07-11 MED ORDER — ACETAMINOPHEN 325 MG PO TABS: 650.0000 mg | ORAL_TABLET | ORAL | Status: DC | PRN

## 2023-07-11 MED ORDER — METHYLPREDNISOLONE SODIUM SUCC 40 MG IJ SOLR
40.0000 mg | Freq: Two times a day (BID) | INTRAMUSCULAR | Status: DC
  Administered 2023-07-11 – 2023-07-12 (×3): 40 mg via INTRAVENOUS
  Filled 2023-07-11 (×3): qty 1

## 2023-07-11 MED ORDER — ASPIRIN 300 MG RE SUPP: 300.0000 mg | Freq: Every day | RECTAL | Status: DC

## 2023-07-11 NOTE — Consult Note (Signed)
Neurology Consultation  Reason for Consult: stroke Referring Physician: Dr.J.Williams CC: Confusion, increased urinary frequency History is obtained from: Chart HPI: Katherine Price is a 77 y.o. female past medical history of hypertension, hyperlipidemia, CAD, tobacco abuse, COPD, anxiety, chronic back pain, documented history of possible dementia, presented for evaluation of confusion and confused urinary frequency.  When I examined the patient, there is no family at bedside.  History obtained from chart review.  Per the daughter in the HPI, at her normal baseline she is oriented to person and place but not to time.  She was noted to be more confused.  Mental status is improved some but not 100% back to baseline.  Did not have any focal lateralizing signs.  Found to be COVID-positive in the ED.  Saturating normally on room air.  No fevers chills.  Because of the confusion, brain imaging in the form of an MRI was done which revealed a small acute infarct in the medial left cerebellum.  Head and neck vessel imaging-MRA head without contrast was motion degraded with no emergent LVO.  Poor visualization due to motion.  Carotid Dopplers were done with right carotid 50 to 69% stenosed and left carotid less than 50% stenosis with vertebral artery system antegrade bilaterally.  LKW: Prior to presentation of the ER-HPI says was noted to be confused at 6 PM yesterday but unclear what the last known well was. IV thrombolysis given?: no, probably outside the window at the time of presentation or last known well unclear-neurology consulted this morning. Premorbid modified Rankin scale (mRS): Unable to reliably ascertain  ROS: Full ROS was performed and is negative except as noted in the HPI.   Past Medical History:  Diagnosis Date   Anxiety    Arthritis    Chronic back pain    Chronic bronchitis (HCC)    COPD (chronic obstructive pulmonary disease) (HCC)    Coronary artery disease    Depression    Dyspnea     orthopnea   Heart murmur    History of peptic ulcer    Hyperlipidemia    Hypertension    Myocardial infarction (HCC)    Orthopnea    Pneumonia    no to pneumonia but yes to chronic bronchitis   Renal insufficiency    Wheezing     History reviewed. No pertinent family history.   Social History:   reports that she quit smoking about 14 months ago. Her smoking use included cigarettes. She has never used smokeless tobacco. She reports that she does not drink alcohol and does not use drugs.  Medications  Current Facility-Administered Medications:    [START ON 07/12/2023]  stroke: early stages of recovery book, , Does not apply, Once, Lorretta Harp, MD   acetaminophen (TYLENOL) tablet 650 mg, 650 mg, Oral, Q4H PRN **OR** acetaminophen (TYLENOL) 160 MG/5ML solution 650 mg, 650 mg, Per Tube, Q4H PRN **OR** acetaminophen (TYLENOL) suppository 650 mg, 650 mg, Rectal, Q4H PRN, Lorretta Harp, MD   albuterol (PROVENTIL) (2.5 MG/3ML) 0.083% nebulizer solution 2.5 mg, 2.5 mg, Nebulization, Q4H PRN, Lorretta Harp, MD   aspirin suppository 300 mg, 300 mg, Rectal, Daily **OR** aspirin tablet 325 mg, 325 mg, Oral, Daily, Lorretta Harp, MD, 325 mg at 07/11/23 1032   atorvastatin (LIPITOR) tablet 10 mg, 10 mg, Oral, Daily, Lorretta Harp, MD, 10 mg at 07/11/23 1032   cefTRIAXone (ROCEPHIN) 1 g in sodium chloride 0.9 % 100 mL IVPB, 1 g, Intravenous, Q24H, Lorretta Harp, MD   dextromethorphan-guaiFENesin [facility]  DM) 30-600 MG per 12 hr tablet 1 tablet, 1 tablet, Oral, BID PRN, Lorretta Harp, MD   enoxaparin (LOVENOX) injection 30 mg, 30 mg, Subcutaneous, Q24H, Otelia Sergeant, RPH, 30 mg at 07/11/23 1032   hydrALAZINE (APRESOLINE) injection 5 mg, 5 mg, Intravenous, Q2H PRN, Lorretta Harp, MD   methylPREDNISolone sodium succinate (SOLU-MEDROL) 40 mg/mL injection 40 mg, 40 mg, Intravenous, Q12H, Lorretta Harp, MD, 40 mg at 07/11/23 1145   ondansetron (ZOFRAN) injection 4 mg, 4 mg, Intravenous, Q8H PRN, Lorretta Harp, MD   QUEtiapine  (SEROQUEL) tablet 25 mg, 25 mg, Oral, QHS, Lorretta Harp, MD   senna-docusate (Senokot-S) tablet 1 tablet, 1 tablet, Oral, QHS PRN, Lorretta Harp, MD  Exam: Current vital signs: BP 135/73 (BP Location: Left Arm)   Pulse 69   Temp 97.9 F (36.6 C) (Oral)   Resp 16   Ht 5\' 3"  (1.6 m)   Wt 43.4 kg   SpO2 100%   BMI 16.95 kg/m  Vital signs in last 24 hours: Temp:  [97.6 F (36.4 C)-98.2 F (36.8 C)] 97.9 F (36.6 C) (08/11 1227) Pulse Rate:  [58-85] 69 (08/11 1227) Resp:  [14-23] 16 (08/11 1227) BP: (135-191)/(57-116) 135/73 (08/11 1227) SpO2:  [92 %-100 %] 100 % (08/11 1227) Weight:  [43.4 kg] 43.4 kg (08/10 1943) General: Somewhat cachectic looking, awake alert in no distress HEENT: Normocephalic atraumatic CVS: Regular rhythm Chest: Clear Abdomen nondistended nontender Neurological exam Awake alert oriented to self, could not tell me the correct age or month. Speech is mildly dysarthric No evidence of aphasia Cranial nerves II to XII intact Motor examination with no drift in any of the 4 extremities Sensation intact Coordination examination with no gross media noted NIH stroke scale 1a Level of Conscious.: 0 1b LOC Questions: 2 1c LOC Commands: 0 2 Best Gaze: 0 3 Visual: 0 4 Facial Palsy: 0 5a Motor Arm - left: 0 5b Motor Arm - Right: 0 6a Motor Leg - Left: 0 6b Motor Leg - Right: 0 7 Limb Ataxia: 0 8 Sensory: 0 9 Best Language: 0 10 Dysarthria: 1 11 Extinct. and Inatten.: 0 TOTAL: 3   Labs I have reviewed labs in epic and the results pertinent to this consultation are:  CBC    Component Value Date/Time   WBC 4.3 07/11/2023 0418   RBC 4.09 07/11/2023 0418   HGB 12.2 07/11/2023 0418   HCT 35.8 (L) 07/11/2023 0418   PLT 231 07/11/2023 0418   MCV 87.5 07/11/2023 0418   MCH 29.8 07/11/2023 0418   MCHC 34.1 07/11/2023 0418   RDW 14.4 07/11/2023 0418   LYMPHSABS 1.7 07/10/2023 1930   MONOABS 0.5 07/10/2023 1930   EOSABS 0.0 07/10/2023 1930   BASOSABS 0.1  07/10/2023 1930    CMP     Component Value Date/Time   NA 138 07/11/2023 0418   K 3.3 (L) 07/11/2023 0418   CL 111 07/11/2023 0418   CO2 19 (L) 07/11/2023 0418   GLUCOSE 96 07/11/2023 0418   BUN 23 07/11/2023 0418   CREATININE 0.96 07/11/2023 0418   CREATININE 1.05 (H) 04/15/2023 1346   CALCIUM 7.8 (L) 07/11/2023 0418   PROT 6.2 (L) 07/10/2023 1930   ALBUMIN 3.6 07/10/2023 1930   AST 26 07/10/2023 1930   ALT 17 07/10/2023 1930   ALKPHOS 63 07/10/2023 1930   BILITOT 0.5 07/10/2023 1930   GFRNONAA >60 07/11/2023 0418   GFRAA >60 08/06/2019 0750    Lipid Panel     Component  Value Date/Time   CHOL 141 07/11/2023 0418   TRIG 48 07/11/2023 0418   HDL 55 07/11/2023 0418   CHOLHDL 2.6 07/11/2023 0418   VLDL 10 07/11/2023 0418   LDLCALC 76 07/11/2023 0418   LDLCALC 200 (H) 04/15/2023 1346    Possible UTI on the urinalysis  Imaging I have reviewed the images obtained:  CT-head--age-indeterminate lacunar infarcts in the left basal ganglia-favored to be chronic but new since 2022.  MRI examination of the brain-small acute infarction in the medial left cerebellum.  No hemorrhage mass effect.  Multiple old small vessel infarcts in the deep gray matter bilaterally  MR angio head extremely motion degraded-no emergent LVO.  Signal void within the cavernous segment of the left ICA which may be due to slow flow or stenosis however this could also be artifactual given the motion.  Carotid Dopplers with right carotid 50 to 69% stenosis.  Left carotid less than 50% stenosis.  Vertebral system antegrade.  Assessment:  Katherine Price is a 77 y.o. female past medical history of hypertension, hyperlipidemia, CAD, tobacco abuse, COPD, anxiety, chronic back pain, documented history of possible dementia, presented for evaluation of confusion and confused urinary frequency  Possible UTI on urinalysis.  Brain imaging-MRI brain done as a part of workup for confusion revealed a left cerebellar  acute ischemic infarct. Currently she remains asymptomatic from the stroke as far as the exam is concerned but is somewhat confused, not sure how much of that is baseline.  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  Etiology under investigation. Multiple stroke risk factors including tobacco abuse.  Recommendations: Continue telemetry Frequent neurochecks Aspirin 81+ Plavix 75 for 3 weeks followed by aspirin only. LDL 76.  On home atorvastatin 10-I would recommend increasing that to atorvastatin 20 for goal LDL less than 70. Follow 2D echocardiogram-pending Follow A1c-pending.  Goal A1c is less than 7. PT OT and speech therapy assessments No need for permissive htn Management of possible UTI and other toxic metabolic derangements were found, per primary team.  Neurology will follow the echo with you  Plan relayed to Dr. Mayford Knife  -- Milon Dikes, MD Neurologist [facility] Pager: [phone]

## 2023-07-11 NOTE — Evaluation (Addendum)
Clinical/Bedside Swallow Evaluation Patient Details  Name: Katherine Price MRN: [id number] Date of Birth: 15-Nov-1946  Today's Date: 07/11/2023 Time: SLP Start Time (ACUTE ONLY): 0840 SLP Stop Time (ACUTE ONLY): 0850 SLP Time Calculation (min) (ACUTE ONLY): 10 min  Past Medical History:  Past Medical History:  Diagnosis Date   Anxiety    Arthritis    Chronic back pain    Chronic bronchitis (HCC)    COPD (chronic obstructive pulmonary disease) (HCC)    Coronary artery disease    Depression    Dyspnea    orthopnea   Heart murmur    History of peptic ulcer    Hyperlipidemia    Hypertension    Myocardial infarction (HCC)    Orthopnea    Pneumonia    no to pneumonia but yes to chronic bronchitis   Renal insufficiency    Wheezing    Past Surgical History:  Past Surgical History:  Procedure Laterality Date   ABDOMINAL HYSTERECTOMY     BLADDER SUSPENSION     CATARACT EXTRACTION W/PHACO Right 09/14/2019   Procedure: CATARACT EXTRACTION PHACO AND INTRAOCULAR LENS PLACEMENT (IOC), RIGHT;  Surgeon: Elliot Cousin, MD;  Location: [facility] ORS;  Service: Ophthalmology;  Laterality: Right;  Lot #[id number] H [place]: 01:10.7 CDE; 12.49   CATARACT EXTRACTION W/PHACO Left 10/19/2019   Procedure: CATARACT EXTRACTION PHACO AND INTRAOCULAR LENS PLACEMENT (IOC) LEFT VISION BLUE;  Surgeon: Elliot Cousin, MD;  Location: [facility] ORS;  Service: Ophthalmology;  Laterality: Left;  [place] 01:19.8 CDE 11.74 Fluid Pack Lot [id number]   CORONARY ARTERY BYPASS GRAFT  2003   LACERATION REPAIR     of forehead   TIBIA FRACTURE SURGERY     rod implanted   HPI:  Per H&P "Katherine Price is a 77 y.o. female with medical history significant of HTN, HLD, COPD, CAD, depression with anxiety, CKD-3A, chronic back pain, possible dementia, who presents with confusion, increased urinary frequency.     For her daughter at the bedside, at her normal baseline, patient is orientated to person and place, but not to the time.  Patient was  noted to be more confused at the dinnertime (about 6 PM). Her mental status has improved and comes back to baseline when I saw pt in ED per her daughter.  Currently patient is alert, oriented to place and person, but confused about the year.  No unilateral numbness or tinglings in extremities.  No facial droop or slurred speech.  She moves all extremities normally.  Patient does not have chest pain, cough, shortness of breath, but was found to have positive COVID in ED, with oxygen desaturation to 88% on room air initially, then improved to 98% on room air later on.  No fever or chills.  Patient does not have nausea, vomiting, diarrhea or abdominal pain.  She has increased urinary frequency, but no dysuria or burning with urination.     Data reviewed independently and ED Course: pt was found to have positive COVID PCR, WBC 4.7, worsening renal function, potassium 3.2, magnesium 2.2, positive urinalysis (cloudy appearance, moderate amount of leukocyte, many bacteria, WBC 21-50).  Temperature normal, blood pressure 169/68, heart rate 58, 85, RR 23.  Chest x-ray negative for infiltration.  Patient is admitted to telemetry bed as inpatient.     CT head:  1. Two small lacunar-type infarcts within the left basal ganglia,  favored to be chronic, but new since 2022. If there is clinical  concern for acute infarction, consider further evaluation with MRI.  2. Advanced chronic microvascular ischemic change and cerebral  volume loss.     MRI of the brain:  1. 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 No hemorrhage or mass effect.  2. Multiple old small vessel infarcts of the deep gray matter."    Assessment / Plan / Recommendation  Clinical Impression  Pt seen for clinical swallowing evaluation. Oral motor exam completed and notable for edentulism and L facial droop. Pt presents with a mild oral dysphagia c/b prolonged mastication of select solid likely due to edentulism. Pharyngeal swallow appeared [facility] per  clinical assessment. No overt s/sx pharyngeal dysphagia noted across trials. Recommend initiation of a mech soft diet with thin liquids with safe swallowing strategies/aspiration precautions as outlined below. SLP to f/u per POC for diet tolerance. SLP Visit Diagnosis: Dysphagia, oral phase (R13.11)    Aspiration Risk  Mild aspiration risk    Diet Recommendation Dysphagia 3 (Mech soft);Thin liquid    Liquid Administration via: Spoon;Cup;Straw Medication Administration:  (as tolerated) Supervision: Patient able to self feed (assistance with meal set up) Compensations: Slow rate;Small sips/bites Postural Changes: Seated upright at 90 degrees;Remain upright for at least 30 minutes after po intake    Other  Recommendations Oral Care Recommendations: Oral care BID;Staff/trained caregiver to provide oral care    Recommendations for follow up therapy are one component of a multi-disciplinary discharge planning process, led by the attending physician.  Recommendations may be updated based on patient status, additional functional criteria and insurance authorization.  Follow up Recommendations Follow physician's recommendations for discharge plan and follow up therapies         Functional Status Assessment Patient has had a recent decline in their functional status and demonstrates the ability to make significant improvements in function in a reasonable and predictable amount of time.  Frequency and Duration min 2x/week  2 weeks       Prognosis Prognosis for improved oropharyngeal function: Good      Swallow Study   General Date of Onset: 07/10/23 HPI: Per H&P "Katherine Price is a 77 y.o. female with medical history significant of HTN, HLD, COPD, CAD, depression with anxiety, CKD-3A, chronic back pain, possible dementia, who presents with confusion, increased urinary frequency.     For her daughter at the bedside, at her normal baseline, patient is orientated to person and place, but not  to the time.  Patient was noted to be more confused at the dinnertime (about 6 PM). Her mental status has improved and comes back to baseline when I saw pt in ED per her daughter.  Currently patient is alert, oriented to place and person, but confused about the year.  No unilateral numbness or tinglings in extremities.  No facial droop or slurred speech.  She moves all extremities normally.  Patient does not have chest pain, cough, shortness of breath, but was found to have positive COVID in ED, with oxygen desaturation to 88% on room air initially, then improved to 98% on room air later on.  No fever or chills.  Patient does not have nausea, vomiting, diarrhea or abdominal pain.  She has increased urinary frequency, but no dysuria or burning with urination.     Data reviewed independently and ED Course: pt was found to have positive COVID PCR, WBC 4.7, worsening renal function, potassium 3.2, magnesium 2.2, positive urinalysis (cloudy appearance, moderate amount of leukocyte, many bacteria, WBC 21-50).  Temperature normal, blood pressure 169/68, heart rate 58, 85, RR 23.  Chest x-ray  negative for infiltration.  Patient is admitted to telemetry bed as inpatient.     CT head:  1. Two small lacunar-type infarcts within the left basal ganglia,  favored to be chronic, but new since 2022. If there is clinical  concern for acute infarction, consider further evaluation with MRI.  2. Advanced chronic microvascular ischemic change and cerebral  volume loss.     MRI of the brain:  1. 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 No hemorrhage or mass effect.  2. Multiple old small vessel infarcts of the deep gray matter." Type of Study: Bedside Swallow Evaluation Previous Swallow Assessment: none Diet Prior to this Study: NPO Temperature Spikes Noted: No Respiratory Status: Room air History of Recent Intubation: No Behavior/Cognition: Alert;Cooperative Oral Cavity Assessment: Within Functional Limits Oral Care  Completed by SLP: Yes Oral Cavity - Dentition: Edentulous Vision: Functional for self-feeding Self-Feeding Abilities: Able to feed self Patient Positioning: Upright in bed Baseline Vocal Quality: Hoarse (mildly hoarse) Volitional Cough: Strong Volitional Swallow: Able to elicit    Oral/Motor/Sensory Function Overall Oral Motor/Sensory Function: Mild impairment Facial ROM: Reduced left Facial Symmetry: Abnormal symmetry left Lingual ROM: Within Functional Limits Lingual Symmetry: Within Functional Limits Lingual Strength: Within Functional Limits Mandible: Within Functional Limits   Ice Chips Ice chips: Within functional limits Presentation: Self Fed;Spoon   Thin Liquid Thin Liquid: Within functional limits Presentation: Straw    Nectar Thick Nectar Thick Liquid: Not tested   Honey Thick Honey Thick Liquid: Not tested   Puree Puree: Within functional limits Presentation: Spoon   Solid     Solid: Impaired Oral Phase Impairments: Impaired mastication Oral Phase Functional Implications: Impaired mastication Pharyngeal Phase Impairments:  (WFL)     Clyde Canterbury, M.S., CCC-SLP Speech-Language Pathologist [facility] [phone] (ASCOM)  Woodroe Chen 07/11/2023,9:14 AM

## 2023-07-11 NOTE — Evaluation (Signed)
Speech Language Pathology Evaluation Patient Details Name: Katherine Price MRN: [id number] DOB: 12/08/1945 Today's Date: 07/11/2023 Time: 4098-1191 SLP Time Calculation (min) (ACUTE ONLY): 15 min  Problem List:  Patient Active Problem List   Diagnosis Date Noted   Stroke (HCC) 07/11/2023   UTI (urinary tract infection) 07/11/2023   COVID-19 virus infection 07/11/2023   Protein-calorie malnutrition, moderate (HCC) 07/11/2023   Hypokalemia 07/11/2023   Chronic kidney disease, stage 3a (HCC) 07/11/2023   History of MI (myocardial infarction) 04/15/2023   Hyperlipidemia 04/15/2023   Chronic bilateral low back pain without sciatica 04/15/2023   Anxiety and depression 04/15/2023   Arthritis 04/15/2023   Primary hypertension 03/04/2023   Agitation 03/03/2023   CAD (coronary artery disease) 01/22/2014   COPD (chronic obstructive pulmonary disease) (HCC) 01/22/2014   Generalized OA 01/22/2014   Smoking 01/22/2014   Past Medical History:  Past Medical History:  Diagnosis Date   Anxiety    Arthritis    Chronic back pain    Chronic bronchitis (HCC)    COPD (chronic obstructive pulmonary disease) (HCC)    Coronary artery disease    Depression    Dyspnea    orthopnea   Heart murmur    History of peptic ulcer    Hyperlipidemia    Hypertension    Myocardial infarction (HCC)    Orthopnea    Pneumonia    no to pneumonia but yes to chronic bronchitis   Renal insufficiency    Wheezing    Past Surgical History:  Past Surgical History:  Procedure Laterality Date   ABDOMINAL HYSTERECTOMY     BLADDER SUSPENSION     CATARACT EXTRACTION W/PHACO Right 09/14/2019   Procedure: CATARACT EXTRACTION PHACO AND INTRAOCULAR LENS PLACEMENT (IOC), RIGHT;  Surgeon: Elliot Cousin, MD;  Location: [facility] ORS;  Service: Ophthalmology;  Laterality: Right;  Lot #[id number] H [place]: 01:10.7 CDE; 12.49   CATARACT EXTRACTION W/PHACO Left 10/19/2019   Procedure: CATARACT EXTRACTION PHACO AND INTRAOCULAR LENS  PLACEMENT (IOC) LEFT VISION BLUE;  Surgeon: Elliot Cousin, MD;  Location: [facility] ORS;  Service: Ophthalmology;  Laterality: Left;  [place] 01:19.8 CDE 11.74 Fluid Pack Lot [id number]   CORONARY ARTERY BYPASS GRAFT  2003   LACERATION REPAIR     of forehead   TIBIA FRACTURE SURGERY     rod implanted   HPI:  Per H&P "Katherine Price is a 77 y.o. female with medical history significant of HTN, HLD, COPD, CAD, depression with anxiety, CKD-3A, chronic back pain, possible dementia, who presents with confusion, increased urinary frequency.     For her daughter at the bedside, at her normal baseline, patient is orientated to person and place, but not to the time.  Patient was noted to be more confused at the dinnertime (about 6 PM). Her mental status has improved and comes back to baseline when I saw pt in ED per her daughter.  Currently patient is alert, oriented to place and person, but confused about the year.  No unilateral numbness or tinglings in extremities.  No facial droop or slurred speech.  She moves all extremities normally.  Patient does not have chest pain, cough, shortness of breath, but was found to have positive COVID in ED, with oxygen desaturation to 88% on room air initially, then improved to 98% on room air later on.  No fever or chills.  Patient does not have nausea, vomiting, diarrhea or abdominal pain.  She has increased urinary frequency, but no dysuria or burning with urination.  Data reviewed independently and ED Course: pt was found to have positive COVID PCR, WBC 4.7, worsening renal function, potassium 3.2, magnesium 2.2, positive urinalysis (cloudy appearance, moderate amount of leukocyte, many bacteria, WBC 21-50).  Temperature normal, blood pressure 169/68, heart rate 58, 85, RR 23.  Chest x-ray negative for infiltration.  Patient is admitted to telemetry bed as inpatient.     CT head:  1. Two small lacunar-type infarcts within the left basal ganglia,  favored to be chronic, but new  since 2022. If there is clinical  concern for acute infarction, consider further evaluation with MRI.  2. Advanced chronic microvascular ischemic change and cerebral  volume loss.     MRI of the brain:  1. 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 No hemorrhage or mass effect.  2. Multiple old small vessel infarcts of the deep gray matter."   Assessment / Plan / Recommendation Clinical Impression  Pt seen for speech/language evaluation. Evaluation completed via informal means. Pt presents with cognitive-linguistic deficits affecting attention, orientation, memory, problem solving, complex verbal expression, and pragmatic language. Speech is c/b short phrases with little elaboration. Anomia noted during confrontation naming and divergent naming tasks. Suspect pt's attention affecting all cognitive-linguistic domains. ?baseline level of functioning. Pt resides in ALF. SLP to continue to f/u while pt in house. Recommend post-acute SLP services for cognitive-linguistic deficits.    SLP Assessment  SLP Recommendation/Assessment: Patient needs continued Speech Lanaguage Pathology Services SLP Visit Diagnosis: Cognitive communication deficit (R41.841)    Recommendations for follow up therapy are one component of a multi-disciplinary discharge planning process, led by the attending physician.  Recommendations may be updated based on patient status, additional functional criteria and insurance authorization.    Follow Up Recommendations  Follow physician's recommendations for discharge plan and follow up therapies    Assistance Recommended at Discharge  Frequent or constant Supervision/Assistance  Functional Status Assessment Patient has had a recent decline in their functional status and demonstrates the ability to make significant improvements in function in a reasonable and predictable amount of time.  Frequency and Duration min 2x/week  2 weeks      SLP Evaluation Cognition  Overall  Cognitive Status: Difficult to assess ("cognitive deficits" and "possible dementia" noted in chart) Arousal/Alertness: Awake/alert Orientation Level: Oriented to person;Oriented to place;Disoriented to situation (not to date, DOW) Attention: Sustained Memory: Impaired Memory Impairment: Decreased recall of new information Problem Solving: Impaired Problem Solving Impairment: Functional basic       Comprehension  Auditory Comprehension Overall Auditory Comprehension: Appears within functional limits for tasks assessed    Expression Expression Primary Mode of Expression: Verbal Verbal Expression Overall Verbal Expression: Impaired Initiation: Impaired Automatic Speech: Name;Social Response Level of Generative/Spontaneous Verbalization: Sentence Repetition: No impairment Naming: Impairment Confrontation: Impaired (5/5 objects; 3/5 pictured objects) Divergent:  (5 animals in 60s) Verbal Errors:  (circumlocution) Pragmatics:  (flat affect; reduced verbal output) Interfering Components: Attention;Premorbid deficit (suspect premorbid deficits) Written Expression Dominant Hand: Right Written Expression: Not tested   Oral / Motor  Oral Motor/Sensory Function Overall Oral Motor/Sensory Function: Mild impairment Facial ROM: Reduced left Facial Symmetry: Abnormal symmetry left Lingual ROM: Within Functional Limits Lingual Symmetry: Within Functional Limits Lingual Strength: Within Functional Limits Mandible: Within Functional Limits            Woodroe Chen 07/11/2023, 9:28 AM

## 2023-07-11 NOTE — Evaluation (Signed)
Physical Therapy Evaluation Patient Details Name: Katherine Price MRN: [id number] DOB: 15-Jun-1946 Today's Date: 07/11/2023  History of Present Illness  Pt is a 77 year old female presenting to the ED with confusion and increased urinary frequency, admitted with UTI, +COVID 19, MRI showing Small acute infarct of the medial left cerebellum. No hemorrhage or mass effect. 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      PMH significant for HTN, HLD, COPD, CAD, depression with anxiety, CKD-3A, chronic back pain, possible dementia, who presents with confusion, increased urinary frequency  Clinical Impression  Pt seen for PT evaluation with OT already in room. Pt reporting she lives in ALF but describes more of a group home setting (1 level, 6 steps with R rail to enter). Pt notes she's ambulatory with SPC at baseline. Pt is able to ambulate in room with HHA to simulate SPC without overt LOB. Pt does present with slightly decreased coordination LLE. Anticipate pt can d/c home with HHPT f/u.        If plan is discharge home, recommend the following: Assistance with cooking/housework;Help with stairs or ramp for entrance;Assist for transportation   Can travel by private vehicle        Equipment Recommendations None recommended by PT (pt already has [facility])  Recommendations for Other Services       Functional Status Assessment Patient has had a recent decline in their functional status and demonstrates the ability to make significant improvements in function in a reasonable and predictable amount of time.     Precautions / Restrictions Precautions Precautions: Fall Restrictions Weight Bearing Restrictions: No      Mobility  Bed Mobility               General bed mobility comments: not tested, pt received & left sitting in recliner.    Transfers Overall transfer level: Needs assistance Equipment used: None Transfers: Sit to/from Stand Sit to Stand: Supervision            General transfer comment: STS with supervision    Ambulation/Gait Ambulation/Gait assistance: Contact guard assist Gait Distance (Feet): 50 Feet (+ 25 ft) Assistive device: 1 person hand held assist Gait Pattern/deviations: Decreased dorsiflexion - right, Decreased dorsiflexion - left, Decreased stride length Gait velocity: slightly decreased     General Gait Details: decreased heel strike BLE, pt uses therapist's hand to simulate [facility]  Stairs            Wheelchair Mobility     Tilt Bed    Modified Rankin (Stroke Patients Only)       Balance Overall balance assessment: Needs assistance Sitting-balance support: Feet supported, Single extremity supported       Standing balance support: During functional activity, Single extremity supported Standing balance-Leahy Scale: Fair                               Pertinent Vitals/Pain Pain Assessment Pain Assessment: No/denies pain    Home Living Family/patient expects to be discharged to::  (?ALF vs group home) Living Arrangements:  (pt reports ALF but describes more of a group home setting) Available Help at Discharge: Available 24 hours/day Type of Home: House Home Access: Stairs to enter Entrance Stairs-Rails: Right Entrance Stairs-Number of Steps: 6   Home Layout: One level Home Equipment: Cane - single point;BSC/3in1      Prior Function Prior Level of Function : Needs assist  Mobility Comments: amb PRN with SPC ADLs Comments: generally MOD I-supervisior ADLs per pt report, assist for IADLs     Extremity/Trunk Assessment   Upper Extremity Assessment Upper Extremity Assessment: Overall WFL for tasks assessed    Lower Extremity Assessment Lower Extremity Assessment: LLE deficits/detail;Overall WFL for tasks assessed LLE Deficits / Details: slightly decreased coordination LLE during heel to shin as compared to RLE       Communication   Communication Communication:  No apparent difficulties  Cognition Arousal: Alert Behavior During Therapy: WFL for tasks assessed/performed Overall Cognitive Status: No family/caregiver present to determine baseline cognitive functioning                                 General Comments: Pt is typically AxOx2 at baseline, pt is oriented to self & date during session, follows simple commands throughout session.        General Comments      Exercises     Assessment/Plan    PT Assessment Patient needs continued PT services  PT Problem List Decreased activity tolerance;Decreased balance;Decreased mobility;Decreased safety awareness;Decreased knowledge of use of DME       PT Treatment Interventions DME instruction;Balance training;Gait training;Neuromuscular re-education;Stair training;Functional mobility training;Therapeutic activities;Therapeutic exercise;Manual techniques;Patient/family education;Cognitive remediation    PT Goals (Current goals can be found in the Care Plan section)  Acute Rehab PT Goals Patient Stated Goal: go home PT Goal Formulation: With patient Time For Goal Achievement: 07/25/23 Potential to Achieve Goals: Good    Frequency Min 1X/week     Co-evaluation               AM-PAC PT "6 Clicks" Mobility  Outcome Measure Help needed turning from your back to your side while in a flat bed without using bedrails?: None Help needed moving from lying on your back to sitting on the side of a flat bed without using bedrails?: None Help needed moving to and from a bed to a chair (including a wheelchair)?: A Little Help needed standing up from a chair using your arms (e.g., wheelchair or bedside chair)?: A Little Help needed to walk in hospital room?: A Little Help needed climbing 3-5 steps with a railing? : A Little 6 Click Score: 20    End of Session   Activity Tolerance: Patient tolerated treatment well Patient left: in chair;with chair alarm set;with call bell/phone  within reach Nurse Communication: Mobility status PT Visit Diagnosis: Unsteadiness on feet (R26.81)    Time: 1610-9604 PT Time Calculation (min) (ACUTE ONLY): 10 min   Charges:   PT Evaluation $PT Eval Low Complexity: 1 Low   PT General Charges $$ ACUTE PT VISIT: 1 Visit         Aleda Grana, PT, DPT 07/11/23, 11:02 AM   Sandi Mariscal 07/11/2023, 11:01 AM

## 2023-07-11 NOTE — TOC Initial Note (Signed)
Transition of Care [facility]) - Initial/Assessment Note    Patient Details  Name: Katherine Price MRN: [id number] Date of Birth: 1946/10/05  [facility]) CM/SW Contact:    Carmina Miller, LCSWA Phone Number: 07/11/2023, 11:17 AM  Clinical Narrative:                  CSW spoke with pt's daughter, explained PT recs for HHPT, she is agreeable to services through [facility]. CSW spoke with Kandee Keen at [facility] via text, agreeable to pt, SOC 24-48 hours after dc.        Patient Goals and CMS Choice            Expected Discharge Plan and Services                                              Prior Living Arrangements/Services                       Activities of Daily Living      Permission Sought/Granted                  Emotional Assessment              Admission diagnosis:  Stroke [facility]) [I63.9] Patient Active Problem List   Diagnosis Date Noted   Stroke (HCC) 07/11/2023   UTI (urinary tract infection) 07/11/2023   COVID-19 virus infection 07/11/2023   Protein-calorie malnutrition, moderate (HCC) 07/11/2023   Hypokalemia 07/11/2023   Chronic kidney disease, stage 3a (HCC) 07/11/2023   History of MI (myocardial infarction) 04/15/2023   Hyperlipidemia 04/15/2023   Chronic bilateral low back pain without sciatica 04/15/2023   Anxiety and depression 04/15/2023   Arthritis 04/15/2023   Primary hypertension 03/04/2023   Agitation 03/03/2023   CAD (coronary artery disease) 01/22/2014   COPD (chronic obstructive pulmonary disease) (HCC) 01/22/2014   Generalized OA 01/22/2014   Smoking 01/22/2014   PCP:  Berniece Salines, FNP Pharmacy:   [facility], [place] - [address] [place] [address] Phone: [phone] Fax: [phone]  [facility] - [place], [place] - [address] [place] [address] Phone: [phone] Fax: [phone]     Social Determinants of Health (SDOH) Social  History: SDOH Screenings   Food Insecurity: No Food Insecurity (04/15/2023)  Housing: Low Risk  (04/15/2023)  Transportation Needs: No Transportation Needs (04/15/2023)  Utilities: Not At Risk (04/15/2023)  Alcohol Screen: Low Risk  (05/31/2023)  Depression (PHQ2-9): Low Risk  (05/31/2023)  Financial Resource Strain: Medium Risk (04/15/2023)  Physical Activity: Inactive (04/15/2023)  Social Connections: Socially Isolated (04/15/2023)  Stress: No Stress Concern Present (04/15/2023)  Tobacco Use: Medium Risk (07/10/2023)   SDOH Interventions:     Readmission Risk Interventions     No data to display

## 2023-07-11 NOTE — Progress Notes (Signed)
Same day note   Echo - challenging due to patient cooperation but nothing grossly abnormal. Recs as in consult note. Neurology will be available as needed. Please call with questions.  -- Milon Dikes, MD Neurologist [facility] Pager: [phone]

## 2023-07-11 NOTE — Progress Notes (Signed)
Initial Nutrition Assessment  DOCUMENTATION CODES:   Underweight  INTERVENTION:   -MVI with minerals daily -Ensure Enlive po TID, each supplement provides 350 kcal and 20 grams of protein  NUTRITION DIAGNOSIS:   Increased nutrient needs related to acute illness as evidenced by estimated needs.  GOAL:   Patient will meet greater than or equal to 90% of their needs  MONITOR:   PO intake, Supplement acceptance, Diet advancement  REASON FOR ASSESSMENT:   Consult Assessment of nutrition requirement/status  ASSESSMENT:   Pt with medical history significant of HTN, HLD, COPD, CAD, depression with anxiety, CKD-3A, chronic back pain, possible dementia, who presents with confusion, increased urinary frequency.  Pt admitted with stroke and possible UTI.  8/11- s/p BSE- dysphagia 3 diet with thin liquids  Reviewed I/O's: +500 ml x 24 hours   Per neurology notes, pt with acute ischemic stroke involving lt cerebellum; etiology under investigation.  Pt currently on a dysphagia 3 diet. No meal completion data to assess at this time.   Reviewed wt hx; pt has experienced a 4.8% wt loss over the past 4 months. While this is not significant for time frame, it is concerning given multiple co-morbidities. Pt is at risk for malnutrition and would benefit from addition of oral nutrition supplements. RD unable to identify malnutrition at this time.  Medications reviewed and include solu-medrol.  No results found for: "HGBA1C" PTA DM medications are none.   Labs reviewed: K: 3.3 (inpatient orders for glycemic control are none).    Diet Order:   Diet Order             DIET DYS 3 Room service appropriate? Yes; Fluid consistency: Thin  Diet effective now                   EDUCATION NEEDS:   No education needs have been identified at this time  Skin:  Skin Assessment: Reviewed RN Assessment  Last BM:  Unknown  Height:   Ht Readings from Last 1 Encounters:  07/10/23 5\' 3"   (1.6 m)    Weight:   Wt Readings from Last 1 Encounters:  07/10/23 43.4 kg    Ideal Body Weight:  52.3 kg  BMI:  Body mass index is 16.95 kg/m.  Estimated Nutritional Needs:   Kcal:  1550-1750  Protein:  80-95 grams  Fluid:  > 1.5 L    Levada Schilling, RD, LDN, CDCES Registered Dietitian II Certified Diabetes Care and Education Specialist Please refer to [facility] for RD and/or RD on-call/weekend/after hours pager

## 2023-07-11 NOTE — Evaluation (Signed)
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 No hemorrhage or mass effect. 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      PMH significant for HTN, HLD, COPD, CAD, depression with anxiety, CKD-3A, chronic back pain, possible dementia, who presents with confusion, increased urinary frequency   Clinical Impression   Chart reviewed, pt greeted in room with nurse present, agreeable to OT evaluation. Co tx completed for hand off of mobility on this date. Pt is alert, oriented to self, place, grossly to date, increased time for processing/decreased awareness. PTA pt lives in a supported environment (group home vs ALF?) with assist for ADL as needed per pt report however she reports she is generally MOD I-supervision with bathing, grooming, dressing, feeding; amb with PRN use of SPC. Pt presents with deficits in [facility], strength, balance, activity tolerance affecting safe and optimal ADL completion. Recommend skilled OT to address functional deficits. OT will follow acutely.       If plan is discharge home, recommend the following: A little help with walking and/or transfers;A little help with bathing/dressing/bathroom;Direct supervision/assist for financial management;Help with stairs or ramp for entrance;Supervision due to cognitive status;Assist for transportation;Direct supervision/assist for medications management;Assistance with cooking/housework    Functional Status Assessment  Patient has had a recent decline in their functional status and demonstrates the ability to make significant improvements in function in a reasonable and predictable amount of time.  Equipment Recommendations  None  recommended by OT    Recommendations for Other Services       Precautions / Restrictions Precautions Precautions: Fall Restrictions Weight Bearing Restrictions: No      Mobility Bed Mobility Overal bed mobility: Modified Independent             General bed mobility comments: for supine>sit    Transfers Overall transfer level: Needs assistance Equipment used: None Transfers: Sit to/from Stand Sit to Stand: Supervision                  Balance Overall balance assessment: Needs assistance Sitting-balance support: Feet supported, Single extremity supported Sitting balance-Leahy Scale: Good     Standing balance support: During functional activity, Single extremity supported Standing balance-Leahy Scale: Fair                             ADL either performed or assessed with clinical judgement   ADL Overall ADL's : Needs assistance/impaired Eating/Feeding: Sitting;Supervision/ safety   Grooming: Wash/dry hands;Wash/dry face;Sitting;Supervision/safety           Upper Body Dressing : Minimal assistance Upper Body Dressing Details (indicate cue type and reason): anticipate Lower Body Dressing: Minimal assistance Lower Body Dressing Details (indicate cue type and reason): socks Toilet Transfer: Minimal assistance Toilet Transfer Details (indicate cue type and reason): simulated with hand held assist Toileting- Clothing Manipulation and Hygiene: Minimal assistance Toileting - Clothing Manipulation Details (indicate cue type and reason): anticipate     Functional mobility during ADLs: Minimal assistance (household distances in room with hand held assist)       Vision Patient Visual Report: No change from baseline Vision Assessment?: Yes Eye Alignment: Within Functional Limits Ocular Range of Motion: Within Functional  Limits Alignment/Gaze Preference: Within Defined Limits Tracking/Visual Pursuits: Able to track stimulus in all quads without  difficulty     Perception Perception: Within Functional Limits       Praxis Praxis: [facility]       Pertinent Vitals/Pain Pain Assessment Pain Assessment: No/denies pain     Extremity/Trunk Assessment Upper Extremity Assessment Upper Extremity Assessment: RUE deficits/detail;LUE deficits/detail RUE Deficits / Details: arthritic deformities noted throughout B hands RUE Sensation: WNL RUE Coordination: decreased fine motor (baseline FMC deficits due to arthritis)   Lower Extremity Assessment Lower Extremity Assessment: Defer to PT evaluation;LLE deficits/detail LLE Deficits / Details: slightly decreased coordination LLE during heel to shin as compared to RLE       Communication Communication Communication: No apparent difficulties   Cognition Arousal: Alert Behavior During Therapy: WFL for tasks assessed/performed Overall Cognitive Status: No family/caregiver present to determine baseline cognitive functioning Area of Impairment: Memory, Awareness, Safety/judgement, Following commands, Problem solving                     Memory: Decreased short-term memory Following Commands: Follows one step commands with increased time Safety/Judgement: Decreased awareness of deficits Awareness: Emergent Problem Solving: Slow processing, Decreased initiation, Requires verbal cues, Difficulty sequencing, Requires tactile cues General Comments: typically a and ox2 at baseline, oriented to self, place, grossly to date/situation on evaluation     General Comments       Exercises Other Exercises Other Exercises: edu re: role of OT, role of rehab, safe ADL completion   Shoulder Instructions      Home Living Family/patient expects to be discharged to::  (?ALF vs group home) Living Arrangements:  (pt reports ALF but describes more of a group home setting) Available Help at Discharge: Available 24 hours/day Type of Home: House Home Access: Stairs to enter [employer] of  Steps: 6 Entrance Stairs-Rails: Right Home Layout: One level     Bathroom Shower/Tub: Tub/shower unit         Home Equipment: Medical laboratory scientific officer - single point;BSC/3in1      Lives With: Spouse (per pt report)    Prior Functioning/Environment Prior Level of Function : Needs assist             Mobility Comments: amb PRN with SPC ADLs Comments: generally MOD I-supervisior ADLs per pt report, assist for IADLs        OT Problem List: Decreased strength;Decreased activity tolerance;Decreased knowledge of use of DME or AE;Impaired UE functional use;Impaired balance (sitting and/or standing);Decreased knowledge of precautions      OT Treatment/Interventions: Self-care/ADL training;Energy conservation;Modalities;Balance training;Therapeutic exercise;DME and/or AE instruction;Neuromuscular education;Manual therapy;Therapeutic activities;Patient/family education    OT Goals(Current goals can be found in the care plan section) Acute Rehab OT Goals Patient Stated Goal: go home OT Goal Formulation: With patient Time For Goal Achievement: 07/25/23 Potential to Achieve Goals: Good ADL Goals Pt Will Perform Lower Body Dressing: with supervision;sit to/from stand Pt Will Transfer to Toilet: with supervision Pt Will Perform Toileting - Clothing Manipulation and hygiene: with supervision;sit to/from stand  OT Frequency: Min 1X/week    Co-evaluation PT/OT/SLP Co-Evaluation/Treatment: Yes Reason for Co-Treatment: To address functional/ADL transfers;Complexity of the patient's impairments (multi-system involvement)   OT goals addressed during session: ADL's and self-care      AM-PAC OT "6 Clicks" Daily Activity     Outcome Measure Help from another person eating meals?: None Help from another person taking care of personal grooming?: A Little Help from another person toileting, which includes using  toliet, bedpan, or urinal?: A Little Help from another person bathing (including washing, rinsing,  drying)?: A Lot Help from another person to put on and taking off regular upper body clothing?: A Little Help from another person to put on and taking off regular lower body clothing?: A Little 6 Click Score: 18   End of Session Nurse Communication: Mobility status  Activity Tolerance: Patient tolerated treatment well Patient left: in chair;with call bell/phone within reach;with chair alarm set  OT Visit Diagnosis: Other abnormalities of gait and mobility (R26.89)                Time: 1027-1050 OT Time Calculation (min): 23 min Charges:  OT General Charges $OT Visit: 1 Visit OT Evaluation $OT Eval Low Complexity: 1 Low  Oleta Mouse, OTD OTR/L  07/11/23, 11:17 AM

## 2023-07-11 NOTE — Progress Notes (Signed)
PHARMACIST - PHYSICIAN COMMUNICATION  CONCERNING:  Enoxaparin (Lovenox) for DVT Prophylaxis    RECOMMENDATION: Patient was prescribed enoxaprin 40mg  q24 hours for VTE prophylaxis.   Filed Weights   07/10/23 1943  Weight: 43.4 kg (95 lb 10.9 oz)    Body mass index is 16.95 kg/m.  Estimated Creatinine Clearance: 23.9 mL/min (A) (by C-G formula based on SCr of 1.31 mg/dL (H)).  Patient is candidate for enoxaparin 30mg  every 24 hours based on CrCl <61ml/min or Weight <45kg  DESCRIPTION: Pharmacy has adjusted enoxaparin dose per [facility] policy.  Patient is now receiving enoxaparin 30 mg every 24 hours   Otelia Sergeant, PharmD, [facility] 07/11/2023 12:47 AM

## 2023-07-11 NOTE — Plan of Care (Signed)

## 2023-07-11 NOTE — Progress Notes (Signed)
PROGRESS NOTE    Katherine Price  WUJ:[id number] DOB: Jan 11, 1946 DOA: 07/10/2023 PCP: Berniece Salines, FNP   Assessment & Plan:   Principal Problem:   Stroke [facility]) Active Problems:   UTI (urinary tract infection)   COVID-19 virus infection   CAD (coronary artery disease)   COPD (chronic obstructive pulmonary disease) (HCC)   Primary hypertension   Chronic kidney disease, stage 3a (HCC)   Hyperlipidemia   Hypokalemia   Anxiety and depression   Protein-calorie malnutrition, moderate (HCC)  Assessment and Plan:  CVA: MRI showed small acute infarct of the medial left cerebellum. No hemorrhage or mass effect. Continue on aspirin, statin. Echo ordered. PT/OT consulted. Neuro consulted   Possible UTI: UA was positive for leukocytosis. Urine cx was obtained. Continue on IV rocephin    COVID-19 virus infection: CXR negative. Asymptomatic, but has intermittent oxygen desaturation. Continue on steroids. Bronchodilators prn   Smoker: received smoking cessation counseling. Pt does not want to quit    Hx of CAD: continue on aspirin, statin   COPD: w/o exacerbation. Bronchodilators prn    HTN: holding enalapril. IV hydralazine prn   CKDIIIa: baseline Cr 1.05. Holding enalapril    HLD: continue on statin    Hypokalemia: potassium given    Moderate protein calorie malnutrition: nutrition consulted         DVT prophylaxis:  lovenox  Code Status:  full  Family Communication:  Disposition Plan: likely d/c back home w/ HH   Level of care: Telemetry Medical Status is: Inpatient Remains inpatient appropriate because: severity of illness    Consultants:  Neuro   Procedures:   Antimicrobials: rocephin    Subjective: Pt c/o fatigue   Objective: Vitals:   07/10/23 2300 07/11/23 0033 07/11/23 0036 07/11/23 0252  BP: (!) 165/65 (!) 159/74  (!) 187/82  Pulse: 64   65  Resp: 17 14  18   Temp:   97.7 F (36.5 C) 98.2 F (36.8 C)  SpO2: 99%  98% 100%  Weight:       Height:        Intake/Output Summary (Last 24 hours) at 07/11/2023 0711 Last data filed at 07/11/2023 0030 Gross per 24 hour  Intake 500 ml  Output --  Net 500 ml   Filed Weights   07/10/23 1943  Weight: 43.4 kg    Examination:  General exam: Appears calm and comfortable  Respiratory system: Clear to auscultation. Respiratory effort normal. Cardiovascular system: S1 & S2+. No rubs, gallops or clicks. No pedal edema. Gastrointestinal system: Abdomen is nondistended, soft and nontender. Normal bowel sounds heard. Central nervous system: Alert and awake. Moves all extremities  Psychiatry: Judgement and insight appears poor. Flat mood and affect    Data Reviewed: I have personally reviewed following labs and imaging studies  CBC: Recent Labs  Lab 07/10/23 1930 07/11/23 0418  WBC 4.7 4.3  NEUTROABS 2.4  --   HGB 12.0 12.2  HCT 35.3* 35.8*  MCV 88.5 87.5  PLT 241 231   Basic Metabolic Panel: Recent Labs  Lab 07/10/23 1930 07/10/23 2017 07/11/23 0418  NA 139  --  138  K 3.2*  --  3.3*  CL 107  --  111  CO2 24  --  19*  GLUCOSE 104*  --  96  BUN 31*  --  23  CREATININE 1.31*  --  0.96  CALCIUM 8.1*  --  7.8*  MG  --  2.2  --    GFR: Estimated Creatinine  Clearance: 32.6 mL/min (by C-G formula based on SCr of 0.96 mg/dL). Liver Function Tests: Recent Labs  Lab 07/10/23 1930  AST 26  ALT 17  ALKPHOS 63  BILITOT 0.5  PROT 6.2*  ALBUMIN 3.6   No results for input(s): "LIPASE", "AMYLASE" in the last 168 hours. No results for input(s): "AMMONIA" in the last 168 hours. Coagulation Profile: No results for input(s): "INR", "PROTIME" in the last 168 hours. Cardiac Enzymes: No results for input(s): "CKTOTAL", "CKMB", "CKMBINDEX", "TROPONINI" in the last 168 hours. BNP (last 3 results) No results for input(s): "PROBNP" in the last 8760 hours. HbA1C: No results for input(s): "HGBA1C" in the last 72 hours. CBG: No results for input(s): "GLUCAP" in the last 168  hours. Lipid Profile: Recent Labs    07/11/23 0418  CHOL 141  HDL 55  LDLCALC 76  TRIG 48  CHOLHDL 2.6   Thyroid Function Tests: No results for input(s): "TSH", "T4TOTAL", "FREET4", "T3FREE", "THYROIDAB" in the last 72 hours. Anemia Panel: No results for input(s): "VITAMINB12", "FOLATE", "FERRITIN", "TIBC", "IRON", "RETICCTPCT" in the last 72 hours. Sepsis Labs: Recent Labs  Lab 07/10/23 2017  LATICACIDVEN 0.9    Recent Results (from the past 240 hour(s))  Blood Culture (routine x 2)     Status: None (Preliminary result)   Collection Time: 07/10/23  8:17 PM   Specimen: BLOOD  Result Value Ref Range Status   Specimen Description BLOOD BLOOD RIGHT ARM  Final   Special Requests   Final    BOTTLES DRAWN AEROBIC AND ANAEROBIC Blood Culture adequate volume   Culture   Final    NO GROWTH < 12 HOURS Performed at [facility], [address] [place], [place] [address]    Report Status PENDING  Incomplete  Blood Culture (routine x 2)     Status: None (Preliminary result)   Collection Time: 07/10/23  8:17 PM   Specimen: BLOOD  Result Value Ref Range Status   Specimen Description BLOOD BLOOD RIGHT ARM  Final   Special Requests   Final    BOTTLES DRAWN AEROBIC AND ANAEROBIC Blood Culture adequate volume   Culture   Final    NO GROWTH < 12 HOURS Performed at [facility], [address] [place], [place] [address]    Report Status PENDING  Incomplete  SARS Coronavirus 2 by RT PCR (hospital order, performed in [facility] hospital lab) *cepheid single result test* Anterior Nasal Swab     Status: Abnormal   Collection Time: 07/10/23  8:18 PM   Specimen: Anterior Nasal Swab  Result Value Ref Range Status   SARS Coronavirus 2 by RT PCR POSITIVE (A) NEGATIVE Final    Comment: (NOTE) SARS-CoV-2 target nucleic acids are DETECTED  SARS-CoV-2 RNA is generally detectable in upper respiratory specimens  during the acute phase of infection.  Positive results are  indicative  of the presence of the identified virus, but do not rule out bacterial infection or co-infection with other pathogens not detected by the test.  Clinical correlation with patient history and  other diagnostic information is necessary to determine patient infection status.  The expected result is negative.  Fact Sheet for Patients:   RoadLapTop.co.za   Fact Sheet for Healthcare Providers:   http://kim-miller.com/    This test is not yet approved or cleared by the [place] FDA and  has been authorized for detection and/or diagnosis of SARS-CoV-2 by FDA under an Emergency Use Authorization (EUA).  This EUA will remain in  effect (meaning this test can be used) for the duration of  the COVID-19 declaration under Section 564(b)(1)  of the Act, 21 U.S.C. section 360-bbb-3(b)(1), unless the authorization is terminated or revoked sooner.   Performed at [facility], [address] [place], [place] [address]          Radiology Studies: MR ANGIO HEAD WO CONTRAST  Result Date: 07/11/2023 CLINICAL DATA:  Stroke follow-up EXAM: MRA HEAD WITHOUT CONTRAST TECHNIQUE: Angiographic images of the Circle of Willis were acquired using MRA technique without intravenous contrast. COMPARISON:  07/10/2023 brain MRI FINDINGS: The examination is degraded by motion. POSTERIOR CIRCULATION: --Vertebral arteries: No enhancement within the right V4 segment, which may be occluded. Normal left V4 segment. --Inferior cerebellar arteries: Normal. --Basilar artery: Normal. --Superior cerebellar arteries: Normal. --Posterior cerebral arteries: Normal. ANTERIOR CIRCULATION: --Intracranial internal carotid arteries: There is a signal void within the cavernous segment of the left ICA. --Anterior cerebral arteries (ACA): Poor visualization of the anterior cerebral arteries --Middle cerebral arteries (MCA): Normal. IMPRESSION: 1. Motion degraded  examination showing no emergent large vessel occlusion. The intracranial circulation could be better evaluated with CTA, if necessary. 2. Signal void within the cavernous segment of the left ICA, which may be due to slow flow or stenosis. However, this could also be artifactual. 3. Poor visualization of the anterior cerebral arteries. 4. No enhancement within the right V4 segment, which may be occluded. Electronically Signed   By: Deatra Robinson M.D.   On: 07/11/2023 02:04   MR BRAIN WO CONTRAST  Result Date: 07/10/2023 CLINICAL DATA:  Altered mental status EXAM: MRI HEAD WITHOUT CONTRAST TECHNIQUE: Multiplanar, multiecho pulse sequences of the brain and surrounding structures were obtained without intravenous contrast. COMPARISON:  None Available. 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 There is multifocal hyperintense T2-weighted signal within the white matter. Generalized volume loss. 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 No acute hemorrhage. The midline structures are normal. Vascular: Major flow voids are preserved. Skull and upper cervical spine: Normal calvarium and skull base. Visualized upper cervical spine and soft tissues are normal. Sinuses/Orbits:No paranasal sinus fluid levels or advanced mucosal thickening. No mastoid or middle ear effusion. Normal orbits. IMPRESSION: 1. 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 No hemorrhage or mass effect. 2. 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 Electronically Signed   By: Deatra Robinson M.D.   On: 07/10/2023 23:13   DG Chest Port 1 View  Result Date: 07/10/2023 CLINICAL DATA:  Altered mental status EXAM: PORTABLE CHEST 1 VIEW COMPARISON:  05/31/2023 FINDINGS: The heart size and mediastinal contours are within normal limits. Prior sternotomy. Aortic atherosclerosis. Hyperinflated lungs. No focal airspace consolidation, pleural effusion, or pneumothorax. The visualized skeletal  structures are unremarkable. IMPRESSION: No active disease. Electronically Signed   By: Duanne Guess D.O.   On: 07/10/2023 20:49   CT Head Wo Contrast  Result Date: 07/10/2023 CLINICAL DATA:  Altered mental status EXAM: CT HEAD WITHOUT CONTRAST TECHNIQUE: Contiguous axial images were obtained from the base of the skull through the vertex without intravenous contrast. RADIATION DOSE REDUCTION: This exam was performed according to the departmental dose-optimization program which includes automated exposure control, adjustment of the mA and/or kV according to patient size and/or use of iterative reconstruction technique. COMPARISON:  11/11/2021 FINDINGS: Brain: Two small lacunar-type infarcts within the left basal ganglia, favored to be chronic, but new since 2022. Remote right basal ganglia and right periventricular lacunar  infarcts are unchanged. No large territory acute infarction. No intracranial hemorrhage, extra-axial collection, or evidence of a mass lesion. Similar ex vacuo dilatation of the right lateral ventricle. Extensive low-density changes within the periventricular and subcortical white matter most compatible with chronic microvascular ischemic change. Mild diffuse cerebral volume loss. Vascular: Atherosclerotic calcifications involving the large vessels of the skull base. No unexpected hyperdense vessel. Skull: Normal. Negative for fracture or focal lesion. Sinuses/Orbits: No acute finding. Other: None. IMPRESSION: 1. Two small lacunar-type infarcts within the left basal ganglia, favored to be chronic, but new since 2022. If there is clinical concern for acute infarction, consider further evaluation with MRI. 2. Advanced chronic microvascular ischemic change and cerebral volume loss. Electronically Signed   By: Duanne Guess D.O.   On: 07/10/2023 20:43        Scheduled Meds:  [START ON 07/12/2023]  stroke: early stages of recovery book   Does not apply Once   aspirin  300 mg Rectal  Daily   Or   aspirin  325 mg Oral Daily   atorvastatin  10 mg Oral Daily   enoxaparin (LOVENOX) injection  30 mg Subcutaneous Q24H   methylPREDNISolone (SOLU-MEDROL) injection  40 mg Intravenous Q12H   QUEtiapine  25 mg Oral QHS   Continuous Infusions:  sodium chloride 75 mL/hr at 07/11/23 0408   cefTRIAXone (ROCEPHIN)  IV     potassium chloride 10 mEq (07/11/23 0617)     LOS: 0 days    Time spent: 35 mins    Charise Killian, MD [facility] Pager [phone]  If 7PM-7AM, please contact night-coverage [email or web address] 07/11/2023, 7:11 AM

## 2023-07-12 DIAGNOSIS — I639 Cerebral infarction, unspecified: Secondary | ICD-10-CM | POA: Diagnosis not present

## 2023-07-12 MED ORDER — ATORVASTATIN CALCIUM 20 MG PO TABS: 20.0000 mg | ORAL_TABLET | Freq: Every day | ORAL | 0 refills | Status: DC

## 2023-07-12 MED ORDER — CLOPIDOGREL BISULFATE 75 MG PO TABS: 75.0000 mg | ORAL_TABLET | Freq: Every day | ORAL | 0 refills | Status: AC

## 2023-07-12 MED ORDER — CIPROFLOXACIN HCL 500 MG PO TABS: 500.0000 mg | ORAL_TABLET | Freq: Two times a day (BID) | ORAL | 0 refills | Status: AC

## 2023-07-12 MED ORDER — ASPIRIN 81 MG PO TBEC
81.0000 mg | DELAYED_RELEASE_TABLET | Freq: Every day | ORAL | Status: DC
  Administered 2023-07-12: 81 mg via ORAL
  Filled 2023-07-12: qty 1

## 2023-07-12 MED ORDER — ASPIRIN 81 MG PO TBEC: 81.0000 mg | DELAYED_RELEASE_TABLET | Freq: Every day | ORAL | 0 refills | Status: DC

## 2023-07-12 NOTE — Discharge Summary (Addendum)
Physician Discharge Summary  Katherine Price ZOX:[id number] DOB: 21-Sep-1946 DOA: 07/10/2023  PCP: Berniece Salines, FNP  Admit date: 07/10/2023 Discharge date: 07/12/2023  Admitted From: home  Disposition:  home w/ home health   Recommendations for Outpatient Follow-up:  Follow up with PCP in 1-2 weeks F/u w/ neuro, Dr. Malvin Johns, in 3-4 weeks   Home Health: yes Equipment/Devices:  Discharge Condition: stable  CODE STATUS: full  Diet recommendation: Dysphagia III diet   Brief/Interim Summary: HPI was taken from Dr. Clyde Lundborg: Katherine Price is a 77 y.o. female with medical history significant of HTN, HLD, COPD, CAD, depression with anxiety, CKD-3A, chronic back pain, possible dementia, who presents with confusion, increased urinary frequency.   For her daughter at the bedside, at her normal baseline, patient is orientated to person and place, but not to the time.  Patient was noted to be more confused at the dinnertime (about 6 PM). Her mental status has improved and comes back to baseline when I saw pt in ED per her daughter.  Currently patient is alert, oriented to place and person, but confused about the year.  No unilateral numbness or tinglings in extremities.  No facial droop or slurred speech.  She moves all extremities normally.  Patient does not have chest pain, cough, shortness of breath, but was found to have positive COVID in ED, with oxygen desaturation to 88% on room air initially, then improved to 98% on room air later on.  No fever or chills.  Patient does not have nausea, vomiting, diarrhea or abdominal pain.  She has increased urinary frequency, but no dysuria or burning with urination.   Data reviewed independently and ED Course: pt was found to have positive COVID PCR, WBC 4.7, worsening renal function, potassium 3.2, magnesium 2.2, positive urinalysis (cloudy appearance, moderate amount of leukocyte, many bacteria, WBC 21-50).  Temperature normal, blood pressure 169/68, heart rate  58, 85, RR 23.  Chest x-ray negative for infiltration.  Patient is admitted to telemetry bed as inpatient.   CT head: 1. Two small lacunar-type infarcts within the left basal ganglia, favored to be chronic, but new since 2022. If there is clinical concern for acute infarction, consider further evaluation with MRI. 2. Advanced chronic microvascular ischemic change and cerebral volume loss.   MRI of the brain: 1. 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 No hemorrhage or mass effect. 2. 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    Discharge Diagnoses:  Principal Problem:   Stroke [facility]) Active Problems:   UTI (urinary tract infection)   COVID-19 virus infection   CAD (coronary artery disease)   COPD (chronic obstructive pulmonary disease) (HCC)   Primary hypertension   Chronic kidney disease, stage 3a (HCC)   Hyperlipidemia   Hypokalemia   Anxiety and depression   Protein-calorie malnutrition, moderate (HCC)  CVA: MRI showed small acute infarct of the medial left cerebellum. No hemorrhage or mass effect. Continue on aspirin, statin & plavix. Continue on plavix & aspirin x 3 weeks & then only aspirin thereafter as per neuro. Echo showed EF 55-60%, diastolic function is indeterminate, no regional motion abnormalities & no shunt noted. PT/OT recs home health. HH set up by CM    Possible UTI: UA was positive for leukocytosis. Urine cx was obtained. Continue on IV rocephin inpatient and d/c home w/ cipro    COVID-19 virus infection: CXR negative. Asymptomatic, but has intermittent oxygen desaturation. Saturating in mid-high 90s on RA. No need  for steroids at d/c    Smoker: received smoking cessation counseling. Pt does not want to quit    Hx of CAD: continue on aspirin, statin    COPD: w/o exacerbation. Bronchodilators prn    HTN: restart enalapril. IV hydralazine prn   CKDIIIa: baseline Cr 1.05. Cr is trending down. Restart enalapril    HLD: continue  on statin    Hypokalemia: WNL today    Moderate protein calorie malnutrition: continue on nutritional supplements   Discharge Instructions  Discharge Instructions     Diet general   Complete by: As directed    Dysphagia III diet   Discharge instructions   Complete by: As directed    F/u w/ PCP in 1-2 weeks. F/u outpatient w/ neuro in 3-4 weeks. Continue on aspirin & plavix x 3 weeks and then just aspirin thereafter   Increase activity slowly   Complete by: As directed       Allergies as of 07/12/2023   No Known Allergies      Medication List     TAKE these medications    acetaminophen 325 MG tablet Commonly known as: Tylenol Take 2 tablets (650 mg total) by mouth every 6 (six) hours as needed for mild pain.   aspirin EC 81 MG tablet Take 1 tablet (81 mg total) by mouth daily. Swallow whole. Start taking on: July 13, 2023   atorvastatin 20 MG tablet Commonly known as: LIPITOR Take 1 tablet (20 mg total) by mouth daily. Start taking on: July 13, 2023 What changed:  medication strength how much to take   benzonatate 100 MG capsule Commonly known as: TESSALON Take 2 capsules (200 mg total) by mouth 2 (two) times daily as needed for cough.   ciprofloxacin 500 MG tablet Commonly known as: Cipro Take 1 tablet (500 mg total) by mouth 2 (two) times daily for 4 days.   clopidogrel 75 MG tablet Commonly known as: Plavix Take 1 tablet (75 mg total) by mouth daily for 21 days.   docusate sodium 100 MG capsule Commonly known as: COLACE Take 1 capsule (100 mg total) by mouth daily as needed for mild constipation.   enalapril 20 MG tablet Commonly known as: VASOTEC TAKE 1 TABLET BY MOUTH ONCE DAILY   promethazine-dextromethorphan 6.25-15 MG/5ML syrup Commonly known as: PROMETHAZINE-DM Take 5 mLs by mouth at bedtime as needed for cough.   QUEtiapine 25 MG tablet Commonly known as: SEROQUEL TAKE 1 TABLET BY MOUTH AT BEDTIME   senna 8.6 MG Tabs  tablet Commonly known as: SENOKOT Take 1 tablet (8.6 mg total) by mouth daily as needed for mild constipation. Until having soft daily stools        Follow-up Information     Morene Crocker, MD Follow up.   Specialty: Neurology Why: F/u in 3-4 weeks Contact information: [address] [facility] [place] [address] [phone]                No Known Allergies  Consultations: Neuro    Procedures/Studies: ECHOCARDIOGRAM COMPLETE  Result Date: 07/11/2023    ECHOCARDIOGRAM REPORT   Patient Name:   ALINDA PLICHTA Date of Exam: 07/11/2023 Medical Rec #:  [id number]       Height:       63.0 in Accession #:    [id number]      Weight:       95.7 lb Date of Birth:  Sep 22, 1946        BSA:  1.413 m Patient Age:    77 years        BP:           140/60 mmHg Patient Gender: F               HR:           60 bpm. Exam Location:  [facility] Procedure: 2D Echo, Cardiac Doppler, Color Doppler and Saline Contrast Bubble            Study Indications:    Stroke I63.9  History:        Patient has no prior history of Echocardiogram examinations. CAD                 and Previous Myocardial Infarction, COPD and Stroke; Risk                 Factors:Dyslipidemia, Hypertension and Former Smoker.  Sonographer:    Dondra Prader RVT RCS Referring Phys: [id number] Lorretta Harp  Sonographer Comments: Image acquisition challenging due to uncooperative patient. Attempted bubble study IMPRESSIONS  1. Left ventricular ejection fraction, by estimation, is 55 to 60%. The left ventricle has normal function. The left ventricle has no regional wall motion abnormalities. Left ventricular diastolic parameters are indeterminate.  2. Right ventricular systolic function is normal. The right ventricular size is normal.  3. The mitral valve is grossly normal. Trivial mitral valve regurgitation. No evidence of mitral stenosis. Severe mitral annular calcification.  4. The aortic valve is calcified. There is  moderate calcification of the aortic valve. There is moderate thickening of the aortic valve. Aortic valve regurgitation is mild. Mild aortic valve stenosis.  5. The inferior vena cava is normal in size with greater than 50% respiratory variability, suggesting right atrial pressure of 3 mmHg.  6. Not visualized. Comparison(s): No prior Echocardiogram. Conclusion(s)/Recommendation(s): No intracardiac source of embolism detected on this transthoracic study. Consider a transesophageal echocardiogram to exclude cardiac source of embolism if clinically indicated. Saline contrast attempted but unable to visualize bubbles on images. FINDINGS  Left Ventricle: Left ventricular ejection fraction, by estimation, is 55 to 60%. The left ventricle has normal function. The left ventricle has no regional wall motion abnormalities. Global longitudinal strain performed but not reported based on interpreter judgement due to suboptimal tracking. The left ventricular internal cavity size was normal in size. There is no left ventricular hypertrophy. Left ventricular diastolic parameters are indeterminate. Right Ventricle: The right ventricular size is normal. Right vetricular wall thickness was not well visualized. Right ventricular systolic function is normal. Left Atrium: Left atrial size was normal in size. Right Atrium: Right atrial size was normal in size. Pericardium: There is no evidence of pericardial effusion. Mitral Valve: The mitral valve is grossly normal. Severe mitral annular calcification. Trivial mitral valve regurgitation. No evidence of mitral valve stenosis. Tricuspid Valve: The tricuspid valve is grossly normal. Tricuspid valve regurgitation is trivial. No evidence of tricuspid stenosis. Aortic Valve: The aortic valve is calcified. There is moderate calcification of the aortic valve. There is moderate thickening of the aortic valve. Aortic valve regurgitation is mild. Mild aortic stenosis is present. Aortic valve mean  gradient measures 17.3 mmHg. Aortic valve peak gradient measures 30.8 mmHg. Aortic valve area, by VTI measures 1.13 cm. Pulmonic Valve: The pulmonic valve was not well visualized. Pulmonic valve regurgitation is not visualized. No evidence of pulmonic stenosis. Aorta: The aortic root, ascending aorta, aortic arch and descending aorta are all structurally normal, with no evidence of dilitation or  obstruction. Venous: The inferior vena cava is normal in size with greater than 50% respiratory variability, suggesting right atrial pressure of 3 mmHg. IAS/Shunts: The atrial septum is grossly normal. Agitated saline contrast was given intravenously to evaluate for intracardiac shunting. Not visualized.  LEFT VENTRICLE PLAX 2D LVOT diam:     1.80 cm   Diastology LV SV:         61        LV e' medial:    4.13 cm/s LV SV Index:   43        LV E/e' medial:  19.2 LVOT Area:     2.54 cm  LV e' lateral:   4.65 cm/s                          LV E/e' lateral: 17.1  RIGHT VENTRICLE            IVC RV Basal diam:  2.50 cm    IVC diam: 0.80 cm RV S prime:     5.51 cm/s TAPSE (M-mode): 1.7 cm LEFT ATRIUM           Index        RIGHT ATRIUM          Index LA Vol (A2C): 25.7 ml 18.18 ml/m  RA Area:     6.46 cm LA Vol (A4C): 36.1 ml 25.54 ml/m  RA Volume:   10.40 ml 7.36 ml/m  AORTIC VALVE                     PULMONIC VALVE AV Area (Vmax):    1.08 cm      PV Vmax:       1.00 m/s AV Area (Vmean):   1.01 cm      PV Peak grad:  4.0 mmHg AV Area (VTI):     1.13 cm AV Vmax:           277.33 cm/s AV Vmean:          190.333 cm/s AV VTI:            0.540 m AV Peak Grad:      30.8 mmHg AV Mean Grad:      17.3 mmHg LVOT Vmax:         117.50 cm/s LVOT Vmean:        75.750 cm/s LVOT VTI:          0.240 m LVOT/AV VTI ratio: 0.44  AORTA Ao Root diam: 2.45 cm Ao Asc diam:  2.60 cm MITRAL VALVE MV Area (PHT): 2.87 cm     SHUNTS MV Decel Time: 264 msec     Systemic VTI:  0.24 m MV E velocity: 79.40 cm/s   Systemic Diam: 1.80 cm MV A velocity:  155.00 cm/s MV E/A ratio:  0.51 Jodelle Red MD Electronically signed by Jodelle Red MD Signature Date/Time: 07/11/2023/3:31:57 PM    Final    US Carotid Bilateral (at [facility] and [facility] only)  Result Date: 07/11/2023 CLINICAL DATA:  77 year old female with history of stroke. EXAM: BILATERAL CAROTID DUPLEX ULTRASOUND TECHNIQUE: Wallace Cullens scale imaging, color Doppler and duplex ultrasound were performed of bilateral carotid and vertebral arteries in the neck. COMPARISON:  None Available. FINDINGS: Criteria: Quantification of carotid stenosis is based on velocity parameters that correlate the residual internal carotid diameter with NASCET-based stenosis levels, using the diameter of the distal internal carotid lumen as the denominator for stenosis measurement. The following  velocity measurements were obtained: RIGHT ICA: Peak systolic velocity 135 cm/sec, End diastolic velocity 37 cm/sec CCA: Peak systolic velocity 75 cm/sec SYSTOLIC ICA/CCA RATIO:  1.8 ECA: Peak systolic velocity 248 cm/sec LEFT ICA: Peak systolic velocity 84 cm/sec, End diastolic velocity 19 cm/sec CCA: 96 cm/sec SYSTOLIC ICA/CCA RATIO:  0.9 ECA: 104 cm/sec RIGHT CAROTID ARTERY: Moderate multifocal atherosclerotic plaque formation about the carotid bulb and bifurcation. No significant tortuosity. Normal low resistance waveforms. RIGHT VERTEBRAL ARTERY:  Antegrade flow. LEFT CAROTID ARTERY: Mild multifocal atherosclerotic plaque formation about the common carotid, bulb, and bifurcation. No significant tortuosity. Normal low resistance waveforms. LEFT VERTEBRAL ARTERY:  Antegrade flow. Upper extremity non-invasive blood pressures: Not obtained. IMPRESSION: 1. 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 2. Left carotid artery system: Less than 50% stenosis secondary to mild multifocal atherosclerotic plaque formation. 3. Vertebral artery system: Patent with antegrade flow  bilaterally. Marliss Coots, MD [facility] Electronically Signed   By: Marliss Coots M.D.   On: 07/11/2023 07:17   MR ANGIO HEAD WO CONTRAST  Result Date: 07/11/2023 CLINICAL DATA:  Stroke follow-up EXAM: MRA HEAD WITHOUT CONTRAST TECHNIQUE: Angiographic images of the Circle of Willis were acquired using MRA technique without intravenous contrast. COMPARISON:  07/10/2023 brain MRI FINDINGS: The examination is degraded by motion. POSTERIOR CIRCULATION: --Vertebral arteries: No enhancement within the right V4 segment, which may be occluded. Normal left V4 segment. --Inferior cerebellar arteries: Normal. --Basilar artery: Normal. --Superior cerebellar arteries: Normal. --Posterior cerebral arteries: Normal. ANTERIOR CIRCULATION: --Intracranial internal carotid arteries: There is a signal void within the cavernous segment of the left ICA. --Anterior cerebral arteries (ACA): Poor visualization of the anterior cerebral arteries --Middle cerebral arteries (MCA): Normal. IMPRESSION: 1. Motion degraded examination showing no emergent large vessel occlusion. The intracranial circulation could be better evaluated with CTA, if necessary. 2. Signal void within the cavernous segment of the left ICA, which may be due to slow flow or stenosis. However, this could also be artifactual. 3. Poor visualization of the anterior cerebral arteries. 4. No enhancement within the right V4 segment, which may be occluded. Electronically Signed   By: Deatra Robinson M.D.   On: 07/11/2023 02:04   MR BRAIN WO CONTRAST  Result Date: 07/10/2023 CLINICAL DATA:  Altered mental status EXAM: MRI HEAD WITHOUT CONTRAST TECHNIQUE: Multiplanar, multiecho pulse sequences of the brain and surrounding structures were obtained without intravenous contrast. COMPARISON:  None Available. 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 There is multifocal hyperintense T2-weighted  signal within the white matter. Generalized volume loss. 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 No acute hemorrhage. The midline structures are normal. Vascular: Major flow voids are preserved. Skull and upper cervical spine: Normal calvarium and skull base. Visualized upper cervical spine and soft tissues are normal. Sinuses/Orbits:No paranasal sinus fluid levels or advanced mucosal thickening. No mastoid or middle ear effusion. Normal orbits. IMPRESSION: 1. 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 No hemorrhage or mass effect. 2. 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 Electronically Signed   By: Deatra Robinson M.D.   On: 07/10/2023 23:13   DG Chest Port 1 View  Result Date: 07/10/2023 CLINICAL DATA:  Altered mental status EXAM: PORTABLE CHEST 1 VIEW COMPARISON:  05/31/2023 FINDINGS: The heart size and mediastinal contours are within normal limits. Prior sternotomy. Aortic atherosclerosis. Hyperinflated lungs. No focal airspace consolidation, pleural effusion, or pneumothorax. The visualized  skeletal structures are unremarkable. IMPRESSION: No active disease. Electronically Signed   By: Duanne Guess D.O.   On: 07/10/2023 20:49   CT Head Wo Contrast  Result Date: 07/10/2023 CLINICAL DATA:  Altered mental status EXAM: CT HEAD WITHOUT CONTRAST TECHNIQUE: Contiguous axial images were obtained from the base of the skull through the vertex without intravenous contrast. RADIATION DOSE REDUCTION: This exam was performed according to the departmental dose-optimization program which includes automated exposure control, adjustment of the mA and/or kV according to patient size and/or use of iterative reconstruction technique. COMPARISON:  11/11/2021 FINDINGS: Brain: Two small lacunar-type infarcts within the left basal ganglia, favored to be chronic, but new since 2022. Remote right basal ganglia and right periventricular lacunar infarcts are unchanged. No large  territory acute infarction. No intracranial hemorrhage, extra-axial collection, or evidence of a mass lesion. Similar ex vacuo dilatation of the right lateral ventricle. Extensive low-density changes within the periventricular and subcortical white matter most compatible with chronic microvascular ischemic change. Mild diffuse cerebral volume loss. Vascular: Atherosclerotic calcifications involving the large vessels of the skull base. No unexpected hyperdense vessel. Skull: Normal. Negative for fracture or focal lesion. Sinuses/Orbits: No acute finding. Other: None. IMPRESSION: 1. Two small lacunar-type infarcts within the left basal ganglia, favored to be chronic, but new since 2022. If there is clinical concern for acute infarction, consider further evaluation with MRI. 2. Advanced chronic microvascular ischemic change and cerebral volume loss. Electronically Signed   By: Duanne Guess D.O.   On: 07/10/2023 20:43   (Echo, Carotid, EGD, Colonoscopy, ERCP)    Subjective: Pt c/o wanting to go back to sleep    Discharge Exam: Vitals:   07/12/23 0425 07/12/23 0800  BP: (!) 161/73 (!) 143/68  Pulse: (!) 59 65  Resp:  16  Temp:  98.2 F (36.8 C)  SpO2:  99%   Vitals:   07/12/23 0006 07/12/23 0415 07/12/23 0425 07/12/23 0800  BP: (!) 160/74 (!) 140/128 (!) 161/73 (!) 143/68  Pulse: 67 66 (!) 59 65  Resp:  18  16  Temp:  98.1 F (36.7 C)  98.2 F (36.8 C)  TempSrc:  Oral  Oral  SpO2:  97%  99%  Weight:      Height:        General: Pt is alert, awake, not in acute distress. Frail appearing  Cardiovascular: S1/S2 +, no rubs, no gallops Respiratory: CTA bilaterally, no wheezing, no rhonchi Abdominal: Soft, NT, ND, bowel sounds + Extremities:  no cyanosis    The results of significant diagnostics from this hospitalization (including imaging, microbiology, ancillary and laboratory) are listed below for reference.     Microbiology: Recent Results (from the past 240 hour(s))  Blood  Culture (routine x 2)     Status: None (Preliminary result)   Collection Time: 07/10/23  8:17 PM   Specimen: BLOOD  Result Value Ref Range Status   Specimen Description BLOOD BLOOD RIGHT ARM  Final   Special Requests   Final    BOTTLES DRAWN AEROBIC AND ANAEROBIC Blood Culture adequate volume   Culture   Final    NO GROWTH 2 DAYS Performed at [facility], [address] [place], [place] [address]    Report Status PENDING  Incomplete  Blood Culture (routine x 2)     Status: None (Preliminary result)   Collection Time: 07/10/23  8:17 PM   Specimen: BLOOD  Result Value Ref Range Status   Specimen Description BLOOD BLOOD RIGHT ARM  Final   Special Requests   Final    BOTTLES DRAWN AEROBIC AND ANAEROBIC Blood Culture adequate volume   Culture   Final    NO GROWTH 2 DAYS Performed at [facility], [address] [place], [place] [address]    Report Status PENDING  Incomplete  SARS Coronavirus 2 by RT PCR (hospital order, performed in [facility] hospital lab) *cepheid single result test* Anterior Nasal Swab     Status: Abnormal   Collection Time: 07/10/23  8:18 PM   Specimen: Anterior Nasal Swab  Result Value Ref Range Status   SARS Coronavirus 2 by RT PCR POSITIVE (A) NEGATIVE Final    Comment: (NOTE) SARS-CoV-2 target nucleic acids are DETECTED  SARS-CoV-2 RNA is generally detectable in upper respiratory specimens  during the acute phase of infection.  Positive results are indicative  of the presence of the identified virus, but do not rule out bacterial infection or co-infection with other pathogens not detected by the test.  Clinical correlation with patient history and  other diagnostic information is necessary to determine patient infection status.  The expected result is negative.  Fact Sheet for Patients:   RoadLapTop.co.za   Fact Sheet for Healthcare Providers:   http://kim-miller.com/    This test is  not yet approved or cleared by the [place] FDA and  has been authorized for detection and/or diagnosis of SARS-CoV-2 by FDA under an Emergency Use Authorization (EUA).  This EUA will remain in effect (meaning this test can be used) for the duration of  the COVID-19 declaration under Section 564(b)(1)  of the Act, 21 U.S.C. section 360-bbb-3(b)(1), unless the authorization is terminated or revoked sooner.   Performed at [facility], [address] [place], [place] [address]      Labs: BNP (last 3 results) No results for input(s): "BNP" in the last 8760 hours. Basic Metabolic Panel: Recent Labs  Lab 07/10/23 1930 07/10/23 2017 07/11/23 0418 07/12/23 0455  NA 139  --  138 138  K 3.2*  --  3.3* 4.0  CL 107  --  111 108  CO2 24  --  19* 20*  GLUCOSE 104*  --  96 117*  BUN 31*  --  23 23  CREATININE 1.31*  --  0.96 0.81  CALCIUM 8.1*  --  7.8* 8.6*  MG  --  2.2  --   --    Liver Function Tests: Recent Labs  Lab 07/10/23 1930  AST 26  ALT 17  ALKPHOS 63  BILITOT 0.5  PROT 6.2*  ALBUMIN 3.6   No results for input(s): "LIPASE", "AMYLASE" in the last 168 hours. No results for input(s): "AMMONIA" in the last 168 hours. CBC: Recent Labs  Lab 07/10/23 1930 07/11/23 0418 07/12/23 0455  WBC 4.7 4.3 5.6  NEUTROABS 2.4  --   --   HGB 12.0 12.2 13.1  HCT 35.3* 35.8* 37.5  MCV 88.5 87.5 85.2  PLT 241 231 258   Cardiac Enzymes: No results for input(s): "CKTOTAL", "CKMB", "CKMBINDEX", "TROPONINI" in the last 168 hours. BNP: Invalid input(s): "POCBNP" CBG: No results for input(s): "GLUCAP" in the last 168 hours. D-Dimer No results for input(s): "DDIMER" in the last 72 hours. Hgb A1c Recent Labs    07/10/23 2017  HGBA1C 6.0*   Lipid Profile Recent Labs    07/11/23 0418  CHOL 141  HDL 55  LDLCALC 76  TRIG 48  CHOLHDL 2.6   Thyroid function studies No results for  input(s): "TSH", "T4TOTAL", "T3FREE", "THYROIDAB" in the last 72  hours.  Invalid input(s): "FREET3" Anemia work up No results for input(s): "VITAMINB12", "FOLATE", "FERRITIN", "TIBC", "IRON", "RETICCTPCT" in the last 72 hours. Urinalysis    Component Value Date/Time   COLORURINE YELLOW (A) 07/11/2023 0650   APPEARANCEUR HAZY (A) 07/11/2023 0650   LABSPEC 1.011 07/11/2023 0650   PHURINE 5.0 07/11/2023 0650   GLUCOSEU NEGATIVE 07/11/2023 0650   HGBUR SMALL (A) 07/11/2023 0650   BILIRUBINUR NEGATIVE 07/11/2023 0650   BILIRUBINUR negative 05/31/2023 1024   KETONESUR 5 (A) 07/11/2023 0650   PROTEINUR NEGATIVE 07/11/2023 0650   UROBILINOGEN 0.2 05/31/2023 1024   NITRITE NEGATIVE 07/11/2023 0650   LEUKOCYTESUR TRACE (A) 07/11/2023 0650   Sepsis Labs Recent Labs  Lab 07/10/23 1930 07/11/23 0418 07/12/23 0455  WBC 4.7 4.3 5.6   Microbiology Recent Results (from the past 240 hour(s))  Blood Culture (routine x 2)     Status: None (Preliminary result)   Collection Time: 07/10/23  8:17 PM   Specimen: BLOOD  Result Value Ref Range Status   Specimen Description BLOOD BLOOD RIGHT ARM  Final   Special Requests   Final    BOTTLES DRAWN AEROBIC AND ANAEROBIC Blood Culture adequate volume   Culture   Final    NO GROWTH 2 DAYS Performed at [facility], [address], [place], [place] [address]    Report Status PENDING  Incomplete  Blood Culture (routine x 2)     Status: None (Preliminary result)   Collection Time: 07/10/23  8:17 PM   Specimen: BLOOD  Result Value Ref Range Status   Specimen Description BLOOD BLOOD RIGHT ARM  Final   Special Requests   Final    BOTTLES DRAWN AEROBIC AND ANAEROBIC Blood Culture adequate volume   Culture   Final    NO GROWTH 2 DAYS Performed at [facility], [address] [place], [place] [address]    Report Status PENDING  Incomplete  SARS Coronavirus 2 by RT PCR (hospital order, performed in [facility] hospital lab) *cepheid single result test* Anterior Nasal Swab     Status: Abnormal    Collection Time: 07/10/23  8:18 PM   Specimen: Anterior Nasal Swab  Result Value Ref Range Status   SARS Coronavirus 2 by RT PCR POSITIVE (A) NEGATIVE Final    Comment: (NOTE) SARS-CoV-2 target nucleic acids are DETECTED  SARS-CoV-2 RNA is generally detectable in upper respiratory specimens  during the acute phase of infection.  Positive results are indicative  of the presence of the identified virus, but do not rule out bacterial infection or co-infection with other pathogens not detected by the test.  Clinical correlation with patient history and  other diagnostic information is necessary to determine patient infection status.  The expected result is negative.  Fact Sheet for Patients:   RoadLapTop.co.za   Fact Sheet for Healthcare Providers:   http://kim-miller.com/    This test is not yet approved or cleared by the [place] FDA and  has been authorized for detection and/or diagnosis of SARS-CoV-2 by FDA under an Emergency Use Authorization (EUA).  This EUA will remain in effect (meaning this test can be used) for the duration of  the COVID-19 declaration under Section 564(b)(1)  of the Act, 21 U.S.C. section 360-bbb-3(b)(1), unless the authorization is terminated or revoked sooner.   Performed at [facility], [address], [place], [place] [address]      Time coordinating discharge: Over 30  minutes  SIGNED:   Charise Killian, MD  [facility] 07/12/2023, 11:49 AM Pager   If 7PM-7AM, please contact night-coverage [email or web address]

## 2023-07-12 NOTE — Progress Notes (Signed)
Patient discharged home. All belongings sent with patient and IV removed. Discharge education and instructions provided to patient and daughter.

## 2023-07-12 NOTE — Progress Notes (Signed)
OT Cancellation Note  Patient Details Name: Katherine Price MRN: [id number] DOB: [birth year]   Cancelled Treatment:    Reason Eval/Treat Not Completed: Patient declined, no reason specified. Attempted treatment session, pt stated "Leave me alone I am trying to sleep. I am not going to do anything I want to sleep." Attempted to engage in toileting or set up breakfast tray at bed side, pt denies needs. Will re-attempt as able.   [name], M.S. OTR/L  07/12/23, 9:46 AM  ascom [phone]

## 2023-07-12 NOTE — TOC Transition Note (Signed)
Transition of Care [facility]) - CM/SW Discharge Note   Patient Details  Name: Katherine Price MRN: [id number] Date of Birth: 07-Aug-1946  Transition of Care [facility]) CM/SW Contact:  Allena Katz, LCSW Phone Number: 07/12/2023, 11:33 AM   Clinical Narrative:   Pt has orders to discharge home with [facility]. Denyse Amass with [facility] notified.       Barriers to Discharge: Continued Medical Work up   Patient Goals and CMS Choice      Discharge Placement                         Discharge Plan and Services Additional resources added to the After Visit Summary for   In-house Referral: Clinical Social Work   Post Acute Care Choice: Home Health                    HH Arranged: OT, PT, Speech Therapy HH Agency: [facility] Date [facility] Agency Contacted: 07/11/23 Time HH Agency Contacted: 1122 Representative spoke with at [facility] Agency: Kandee Keen  Social Determinants of Health (SDOH) Interventions SDOH Screenings   Food Insecurity: No Food Insecurity (04/15/2023)  Housing: Low Risk  (04/15/2023)  Transportation Needs: No Transportation Needs (04/15/2023)  Utilities: Not At Risk (04/15/2023)  Alcohol Screen: Low Risk  (05/31/2023)  Depression (PHQ2-9): Low Risk  (05/31/2023)  Financial Resource Strain: Medium Risk (04/15/2023)  Physical Activity: Inactive (04/15/2023)  Social Connections: Socially Isolated (04/15/2023)  Stress: No Stress Concern Present (04/15/2023)  Tobacco Use: Medium Risk (07/10/2023)     Readmission Risk Interventions     No data to display

## 2023-07-13 ENCOUNTER — Telehealth: Payer: Self-pay

## 2023-07-13 NOTE — Transitions of Care (Post Inpatient/ED Visit) (Signed)
07/13/2023  Name: Katherine Price MRN: [id number] DOB: 03/06/1946  Today's TOC FU Call Status: Today's TOC FU Call Status:: Successful TOC FU Call Completed TOC FU Call Complete Date: 07/13/23  Transition Care Management Follow-up Telephone Call Date of Discharge: 07/12/23 Discharge Facility: [facility]) Type of Discharge: Inpatient Admission Primary Inpatient Discharge Diagnosis:: altered mental status How have you been since you were released from the hospital?: Better Any questions or concerns?: No  Items Reviewed: Did you receive and understand the discharge instructions provided?: Yes Medications obtained,verified, and reconciled?: Yes (Medications Reviewed) Any new allergies since your discharge?: No Dietary orders reviewed?: Yes Do you have support at home?: Yes People in Home: sibling(s)  Medications Reviewed Today: Medications Reviewed Today     Reviewed by Karena Addison, LPN (Licensed Practical Nurse) on 07/13/23 at 1012  Med List Status: <None>   Medication Order Taking? Sig Documenting Provider Last Dose Status Informant  acetaminophen (TYLENOL) 325 MG tablet [id number] Yes Take 2 tablets (650 mg total) by mouth every 6 (six) hours as needed for mild pain. Berniece Salines, FNP Taking Active Nursing Home Medication Administration Guide (MAG)  aspirin EC 81 MG tablet [id number] Yes Take 1 tablet (81 mg total) by mouth daily. Swallow whole. Charise Killian, MD Taking Active   atorvastatin (LIPITOR) 20 MG tablet [id number] Yes Take 1 tablet (20 mg total) by mouth daily. Charise Killian, MD Taking Active   benzonatate (TESSALON) 100 MG capsule [id number] No Take 2 capsules (200 mg total) by mouth 2 (two) times daily as needed for cough.  Patient not taking: Reported on 07/13/2023   Berniece Salines, FNP Not Taking Active Nursing Home Medication Administration Guide (MAG)  ciprofloxacin (CIPRO) 500 MG tablet [id number] Yes Take 1 tablet (500  mg total) by mouth 2 (two) times daily for 4 days. Charise Killian, MD Taking Active   clopidogrel (PLAVIX) 75 MG tablet [id number] Yes Take 1 tablet (75 mg total) by mouth daily for 21 days. Charise Killian, MD Taking Active   docusate sodium (COLACE) 100 MG capsule [id number] Yes Take 1 capsule (100 mg total) by mouth daily as needed for mild constipation. Berniece Salines, FNP Taking Active Nursing Home Medication Administration Guide (MAG)  enalapril (VASOTEC) 20 MG tablet [id number] Yes TAKE 1 TABLET BY MOUTH ONCE DAILY Berniece Salines, FNP Taking Active Nursing Home Medication Administration Guide (MAG)  promethazine-dextromethorphan (PROMETHAZINE-DM) 6.25-15 MG/5ML syrup [id number] No Take 5 mLs by mouth at bedtime as needed for cough.  Patient not taking: Reported on 07/13/2023   Berniece Salines, FNP Not Taking Active Nursing Home Medication Administration Guide (MAG)  QUEtiapine (SEROQUEL) 25 MG tablet [id number] Yes TAKE 1 TABLET BY MOUTH AT BEDTIME Berniece Salines, FNP Taking Active Nursing Home Medication Administration Guide (MAG)  senna (SENOKOT) 8.6 MG TABS tablet [id number] Yes Take 1 tablet (8.6 mg total) by mouth daily as needed for mild constipation. Until having soft daily stools Berniece Salines, FNP Taking Active Nursing Home Medication Administration Guide (MAG)            Home Care and Equipment/Supplies: Were Home Health Services Ordered?: NA Any new equipment or medical supplies ordered?: NA  Functional Questionnaire: Do you need assistance with bathing/showering or dressing?: No Do you need assistance with meal preparation?: No Do you need assistance with eating?: No Do you have difficulty maintaining continence: No Do you need assistance with getting out of bed/getting out of a chair/moving?: No  Do you have difficulty managing or taking your medications?: No  Follow up appointments reviewed: PCP Follow-up appointment confirmed?: Yes Date of PCP follow-up  appointment?: 07/19/23 Follow-up Provider: [facility] Follow-up appointment confirmed?: No Reason Specialist Follow-Up Not Confirmed: Patient has Specialist Provider Number and will Call for Appointment Do you need transportation to your follow-up appointment?: No Do you understand care options if your condition(s) worsen?: Yes-patient verbalized understanding    SIGNATURE Karena Addison, LPN [facility] Nurse Health Advisor Direct Dial [phone]

## 2023-07-15 LAB — CULTURE, BLOOD (ROUTINE X 2)
Culture: NO GROWTH
Culture: NO GROWTH
Special Requests: ADEQUATE
Special Requests: ADEQUATE

## 2023-07-19 ENCOUNTER — Ambulatory Visit (INDEPENDENT_AMBULATORY_CARE_PROVIDER_SITE_OTHER): Payer: Medicare (Managed Care) | Admitting: Nurse Practitioner

## 2023-07-19 NOTE — Progress Notes (Signed)
No show

## 2023-07-28 ENCOUNTER — Other Ambulatory Visit: Payer: Self-pay | Admitting: Nurse Practitioner

## 2023-07-29 NOTE — Telephone Encounter (Signed)
Requested medication (s) are due for refill today: routing for review  Requested medication (s) are on the active medication list: yes  Last refill:    Future visit scheduled: no  Notes to clinic:  Unable to refill per protocol, last refill by another provider.     Requested Prescriptions  Pending Prescriptions Disp Refills   atorvastatin (LIPITOR) 20 MG tablet [Pharmacy Med Name: ATORVASTATIN CALCIUM 20 MG TAB] 30 tablet 0    Sig: TAKE 1 TABLET BY MOUTH ONCE DAILY     Cardiovascular:  Antilipid - Statins Failed - 07/28/2023  2:14 PM      Failed - Lipid Panel in normal range within the last 12 months    Cholesterol  Date Value Ref Range Status  07/11/2023 141 0 - 200 mg/dL Final   LDL Cholesterol (Calc)  Date Value Ref Range Status  04/15/2023 200 (H) mg/dL (calc) Final    Comment:    LDL-C levels > or = 190 mg/dL may indicate familial  hypercholesterolemia (FH). Clinical assessment and  measurement of blood lipid levels should be  considered for all first degree relatives of patients with an FH diagnosis. LDL Cholesterol (LDL-C) levels > or = 300 mg/dL may indicate homozygous familial hypercholesterolemia (HoFH). Untreated,  these extremely high LDL-C levels can result in premature CV events and mortality. Patients should be identified early and provided appropriate interventions to reduce the cumulative LDL-C burden from birth. . For questions about testing for familial hypercholesterolemia, please call Engineer, materials at 1.866.GENE.INFO. Wardell Honour, et al. J National Lipid Association  Recommendations for Patient-Centered Management of Dyslipidemia: Part 1 Journal of  Clinical Lipidology 2015;9(2), 129-169. Cuchel, M. et al. (2014). Homozygous familial hypercholesterolaemia: new insights and guidance for clinicians to improve detection and clinical management. European Heart Journal, 35(32), [phone]. Reference range: <100 . Desirable range  <100 mg/dL for primary prevention;   <70 mg/dL for patients with CHD or diabetic patients  with > or = 2 CHD risk factors. Marland Kitchen LDL-C is now calculated using the Martin-Hopkins  calculation, which is a validated novel method providing  better accuracy than the Friedewald equation in the  estimation of LDL-C.  Horald Pollen et al. Lenox Ahr. 3244;010(27): 2061-2068  (http://education.QuestDiagnostics.com/faq/FAQ164)    LDL Cholesterol  Date Value Ref Range Status  07/11/2023 76 0 - 99 mg/dL Final    Comment:           Total Cholesterol/HDL:CHD Risk Coronary Heart Disease Risk Table                     Men   Women  1/2 Average Risk   3.4   3.3  Average Risk       5.0   4.4  2 X Average Risk   9.6   7.1  3 X Average Risk  23.4   11.0        Use the calculated Patient Ratio above and the CHD Risk Table to determine the patient's CHD Risk.        ATP III CLASSIFICATION (LDL):  <100     mg/dL   Optimal  253-664  mg/dL   Near or Above                    Optimal  130-159  mg/dL   Borderline  403-474  mg/dL   High  >259     mg/dL   Very High Performed at [facility], [address] [place], [place] [address]    HDL  Date Value Ref Range Status  07/11/2023 55 >40 mg/dL Final   Triglycerides  Date Value Ref Range Status  07/11/2023 48 <150 mg/dL Final         Passed - Patient is not pregnant      Passed - Valid encounter within last 12 months    Recent Outpatient Visits           1 week ago Erroneous encounter - disregard   [facility] Berniece Salines, FNP   1 month ago Urinary frequency   [facility] Berniece Salines, FNP   3 months ago Primary hypertension   [place] [facility] Berniece Salines, FNP   4 months ago Agitation   [place] [facility] Mecum, Oswaldo Conroy, PA-C   1 year ago Chronic bilateral low back pain without sciatica   [facility] Berniece Salines, FNP               ASPIRIN LOW DOSE 81 MG tablet [Pharmacy Med Name: ASPIRIN LOW DOSE 81 MG DR TAB] 30 tablet 0    Sig: TAKE 1 TABLET BY MOUTH ONCE DAILY SWALLOW WHOLE     Analgesics:  NSAIDS - aspirin Passed - 07/28/2023  2:14 PM      Passed - Cr in normal range and within 360 days    Creat  Date Value Ref Range Status  04/15/2023 1.05 (H) 0.60 - 1.00 mg/dL Final   Creatinine, Ser  Date Value Ref Range Status  07/12/2023 0.81 0.44 - 1.00 mg/dL Final         Passed - eGFR is 10 or above and within 360 days    GFR calc Af Amer  Date Value Ref Range Status  08/06/2019 >60 >60 mL/min Final   GFR, Estimated  Date Value Ref Range Status  07/12/2023 >60 >60 mL/min Final    Comment:    (NOTE) Calculated using the CKD-EPI Creatinine Equation (2021)    eGFR  Date Value Ref Range Status  04/15/2023 54 (L) > OR = 60 mL/min/1.55m2 Final         Passed - Patient is not pregnant      Passed - Valid encounter within last 12 months    Recent Outpatient Visits           1 week ago Erroneous encounter - disregard   [facility] Berniece Salines, FNP   1 month ago Urinary frequency   [facility] Berniece Salines, FNP   3 months ago Primary hypertension   [facility] Berniece Salines, FNP   4 months ago Agitation   [facility] Mecum, Oswaldo Conroy, PA-C   1 year ago Chronic bilateral low back pain without sciatica   [facility] Berniece Salines, [place]

## 2023-08-03 ENCOUNTER — Telehealth: Payer: Self-pay

## 2023-08-03 NOTE — Telephone Encounter (Signed)
Call from [facility]. She states hey have been unable to make contact with PT. She requested review of medication from [facility] of 05/31/2023. Meds reviewed.  Fax number provided.

## 2023-09-13 IMAGING — CT CT MAXILLOFACIAL W/O CM
3 series · 15 of 47 positions shown, 18 images · non-contrast
Comparison: CT brain 05/06/2011

CLINICAL DATA: Fall, trauma

EXAM:
CT HEAD WITHOUT CONTRAST
CT MAXILLOFACIAL WITHOUT CONTRAST
CT CERVICAL SPINE WITHOUT CONTRAST
TECHNIQUE: Multidetector CT imaging of the head, cervical spine, and
maxillofacial structures were performed using the standard protocol
without intravenous contrast. Multiplanar CT image reconstructions
of the cervical spine and maxillofacial structures were also
generated.

[Series 2: max soft · axial · 0.29mm/px · z∈[+174,+308]mm · 9 of 79 slices shown, 12 images]
[im 6/79  brain]
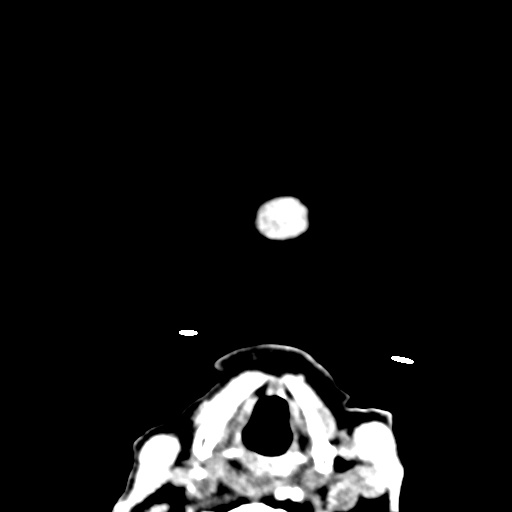
[im 6/79  bone]
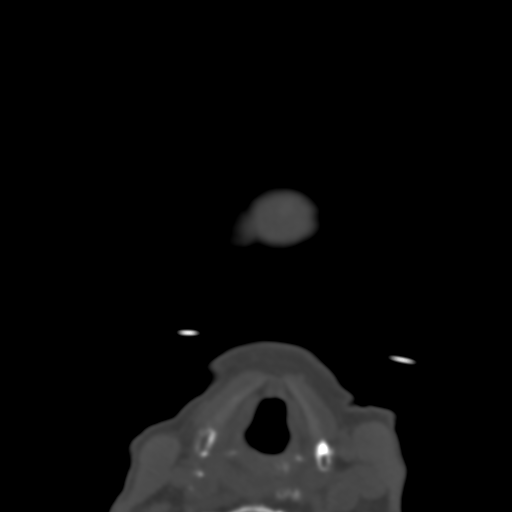
[im 14/79  bone]
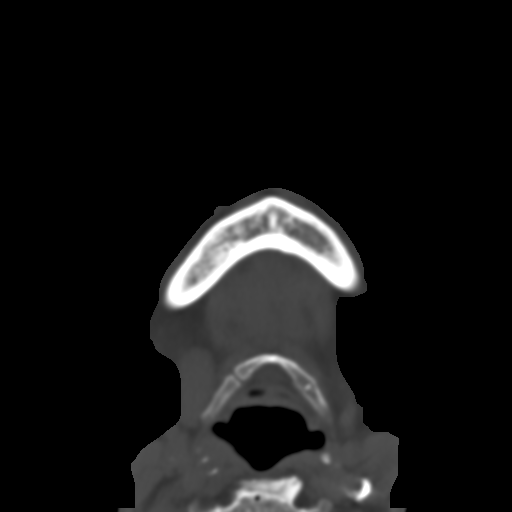
[im 22/79  bone]
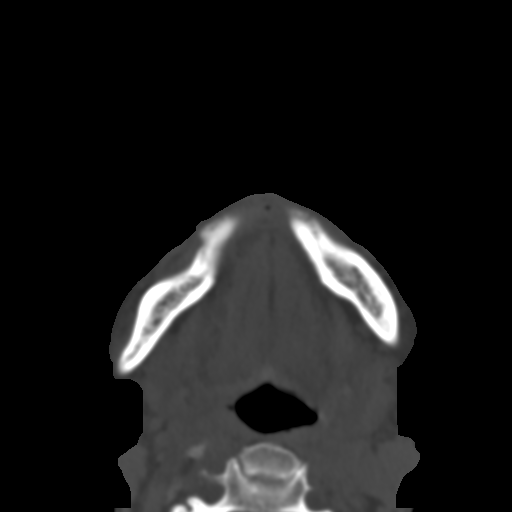
[im 30/79  bone]
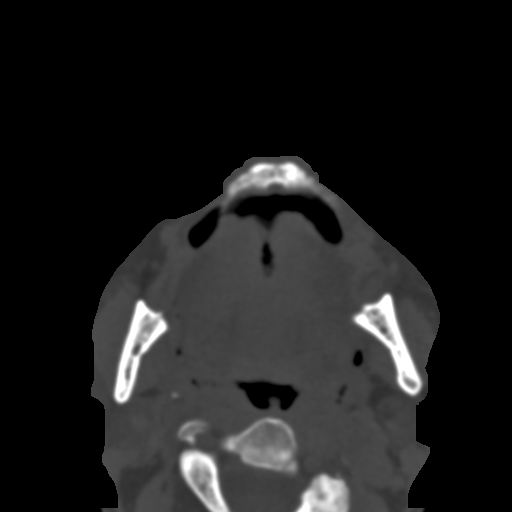
[im 41/79  brain]
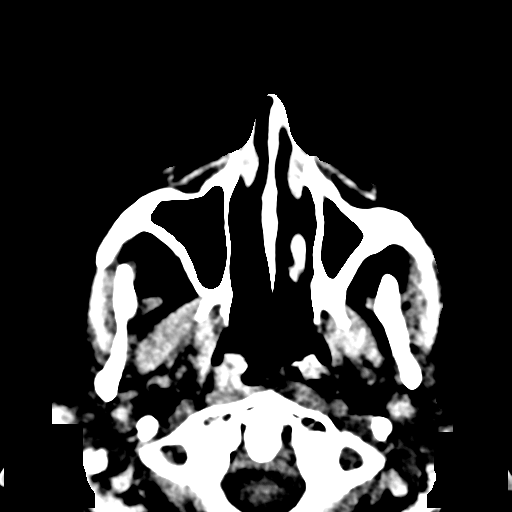
[im 41/79  bone]
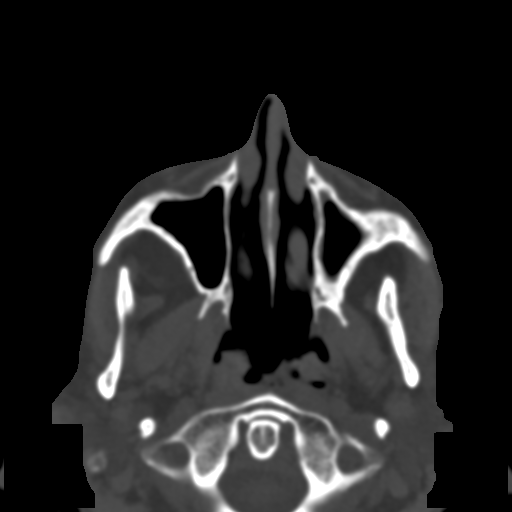
[im 49/79  bone]
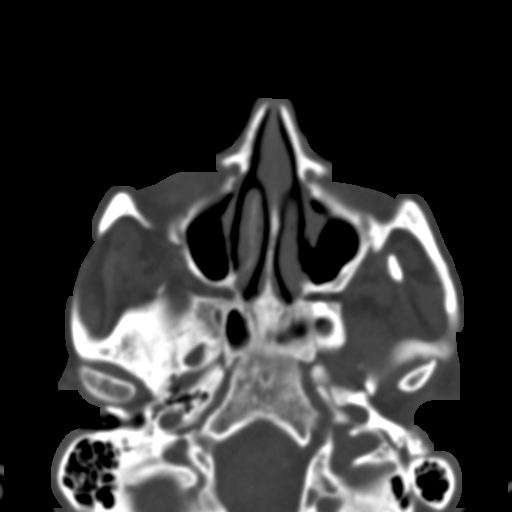
[im 57/79  bone]
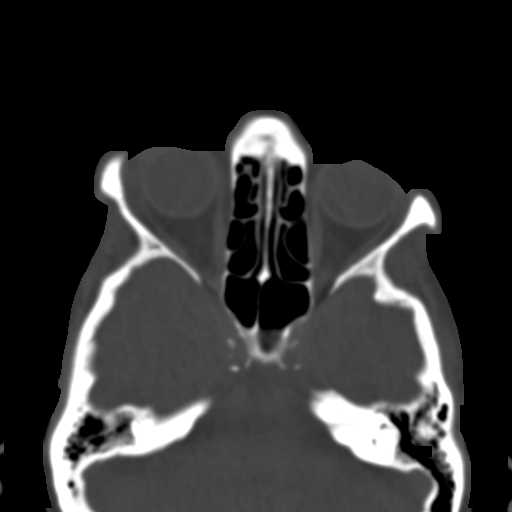
[im 65/79  bone]
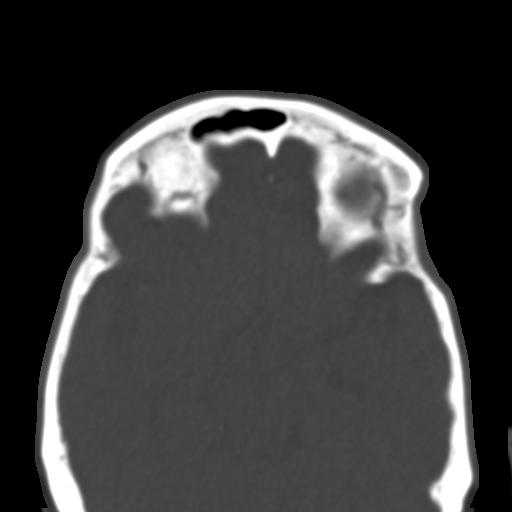
[im 73/79  brain]
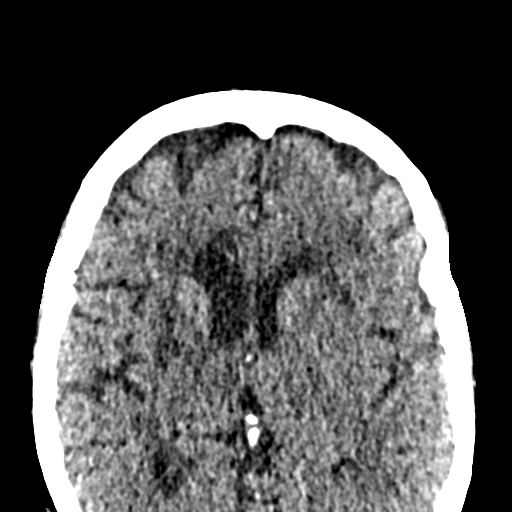
[im 73/79  bone]
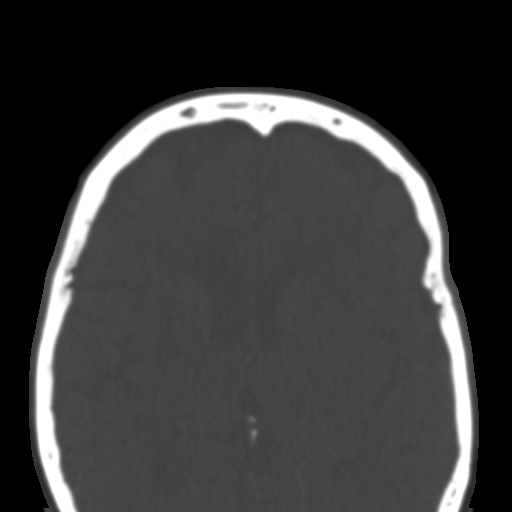

[Series 6: coronal soft · coronal · 0.33mm/px · 3 of 69 slices shown]
[im 23/69  bone]
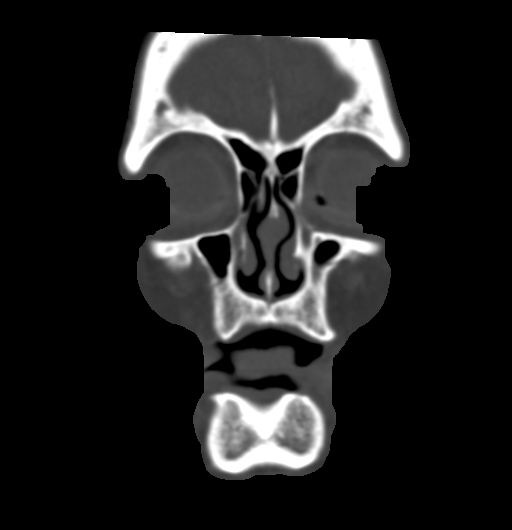
[im 31/69  bone]
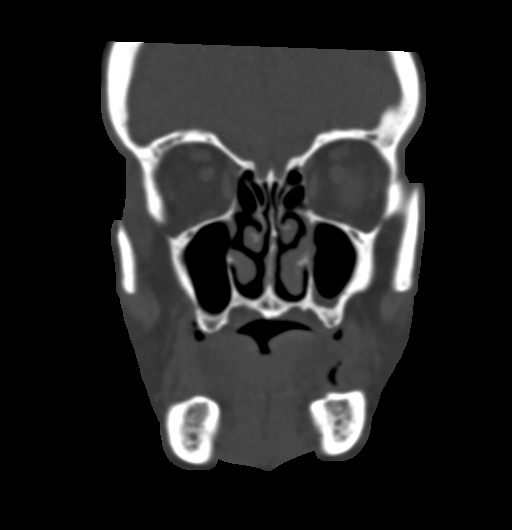
[im 38/69  bone]
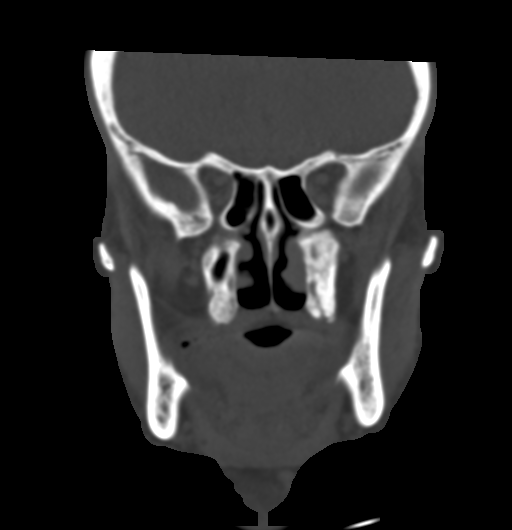

[Series 7: sagittal soft · sagittal · 0.29mm/px · 3 of 82 slices shown]
[im 28/82  bone]
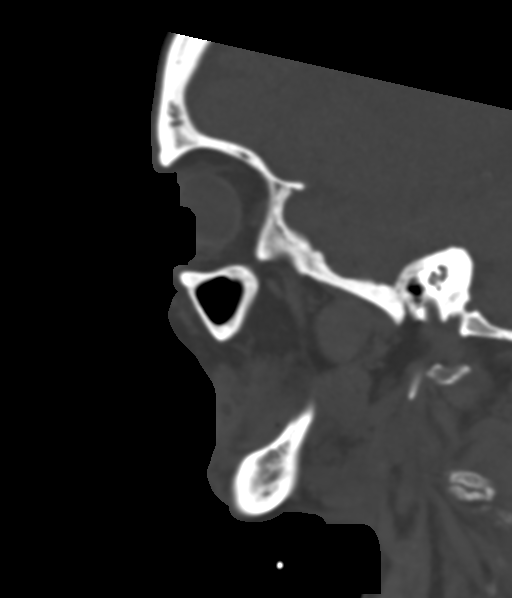
[im 41/82  bone]
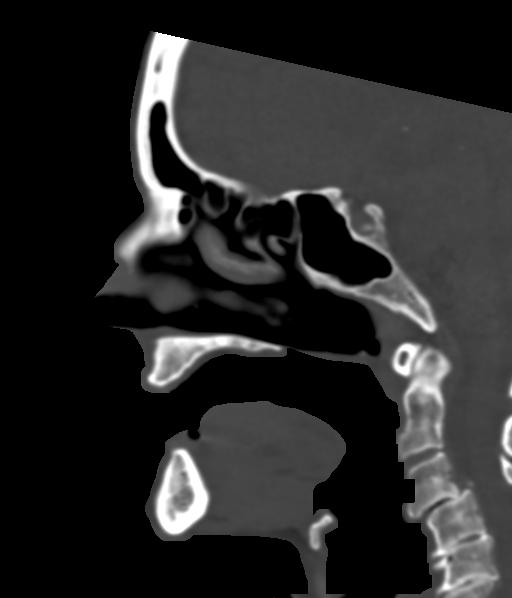
[im 55/82  bone]
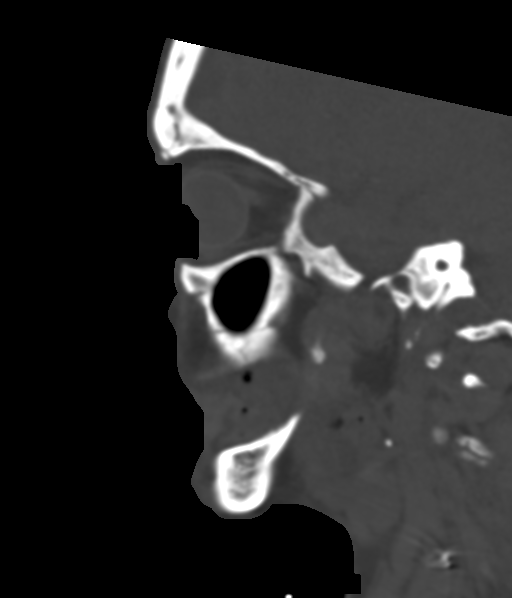

[15 of 47 positions shown; findings below may reference images not displayed]

FINDINGS: CT HEAD FINDINGS

Brain: No acute territorial infarction, hemorrhage or intracranial
mass. Mild atrophy. Fairly extensive white matter hypodensity
consistent with chronic small vessel ischemic change. Small chronic
infarcts within the right white matter and basal ganglia with mild
ex vacuo dilatation of the right lateral ventricle. Ventricles are
nonenlarged.

Vascular: No hyperdense vessels.  Carotid vascular calcification

Skull: Normal. Negative for fracture or focal lesion.

Other: None

CT MAXILLOFACIAL FINDINGS

Osseous: Mastoid air cells are clear. Slight anterior sublux of left
mandibular head, probably chronic. No mandibular fracture. Zygomatic
arches and pterygoid plates are intact. No nasal bone fracture

Orbits: Negative. No traumatic or inflammatory finding.

Sinuses: Thickening of the maxillary sinus walls consistent with
chronic sinusitis. Mild mucosal thickening.

Soft tissues: Negative.

CT CERVICAL SPINE FINDINGS

Alignment: 3 mm anterolisthesis C3 on C4 with trace retrolisthesis
C5 on C6. Trace anterolisthesis C7 on T1. Mild reversal of cervical
lordosis. Facet alignment within normal limits

Skull base and vertebrae: No acute fracture. No primary bone lesion
or focal pathologic process.

Soft tissues and spinal canal: No prevertebral fluid or swelling. No
visible canal hematoma.

Disc levels: Advanced degenerative changes C3 through C7 with disc
space narrowing and osteophyte. Hypertrophic facet degenerative
changes at multiple levels with foraminal stenosis.

Upper chest: Negative.

Other: None
IMPRESSION: 1. No CT evidence for acute intracranial abnormality. Atrophy and
chronic small vessel ischemic changes of the white matter. Multiple
small chronic infarcts within the right white matter and basal
ganglia
2. No acute facial bone fracture
3. Reversal of cervical lordosis with trace listhesis at multiple
levels likely degenerative. No fracture is seen.

## 2023-09-13 IMAGING — CR DG KNEE COMPLETE 4+V*R*
4 series · 4 of 4 positions shown · non-contrast
Comparison: None.

CLINICAL DATA: Fall, right knee

EXAM:
RIGHT KNEE - COMPLETE 4+ VIEW

[knee ap]
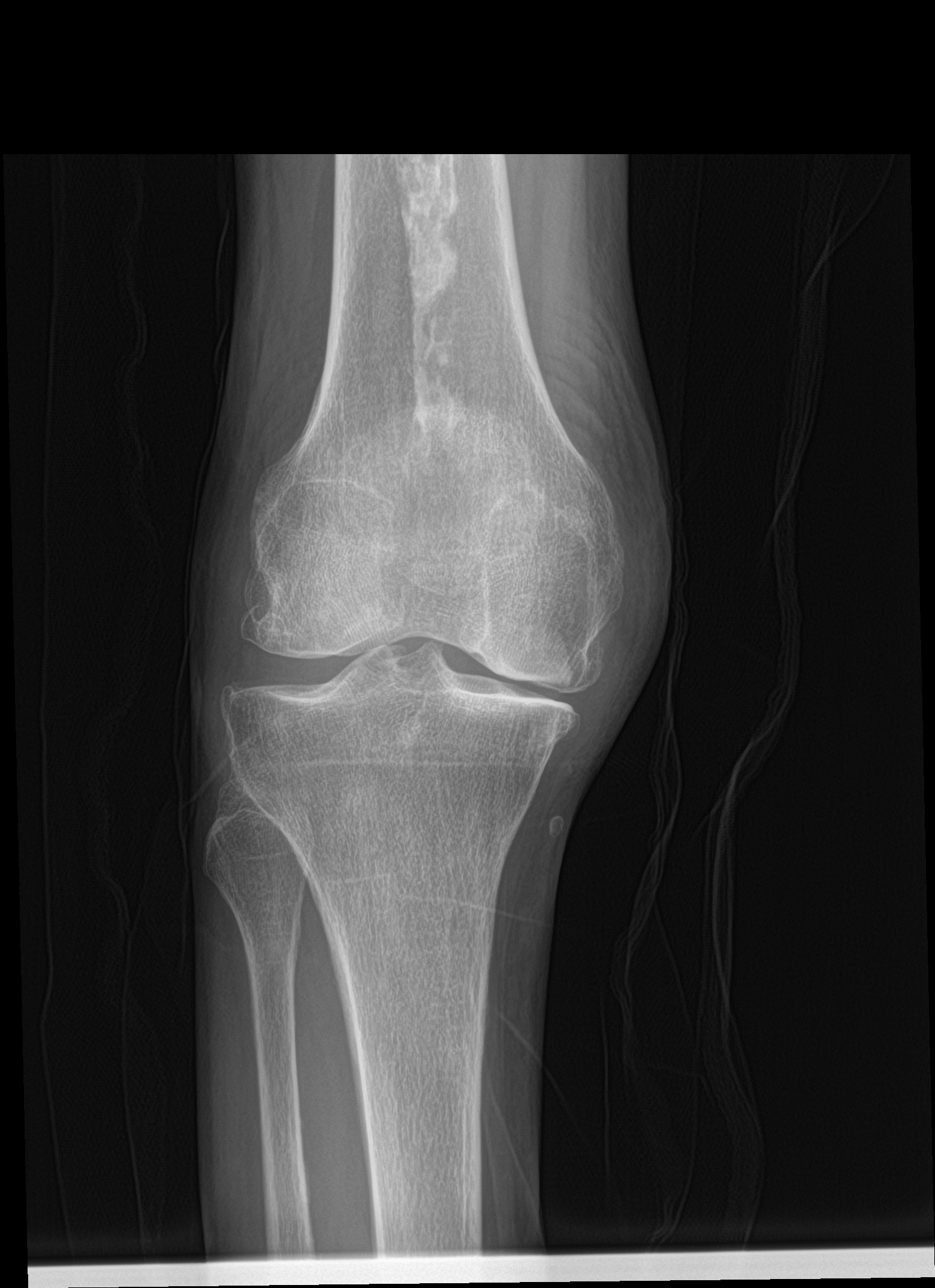

[knee obl (1 of 2)]
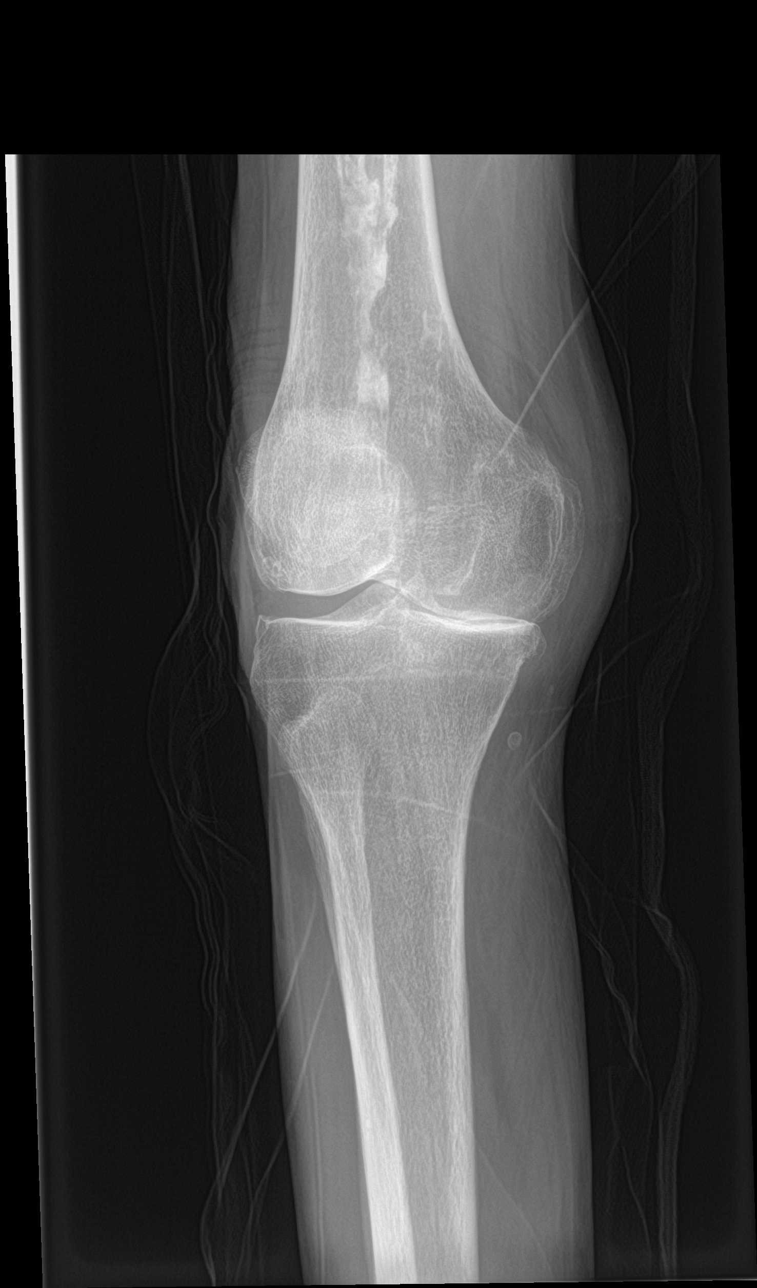

[knee obl (2 of 2)]
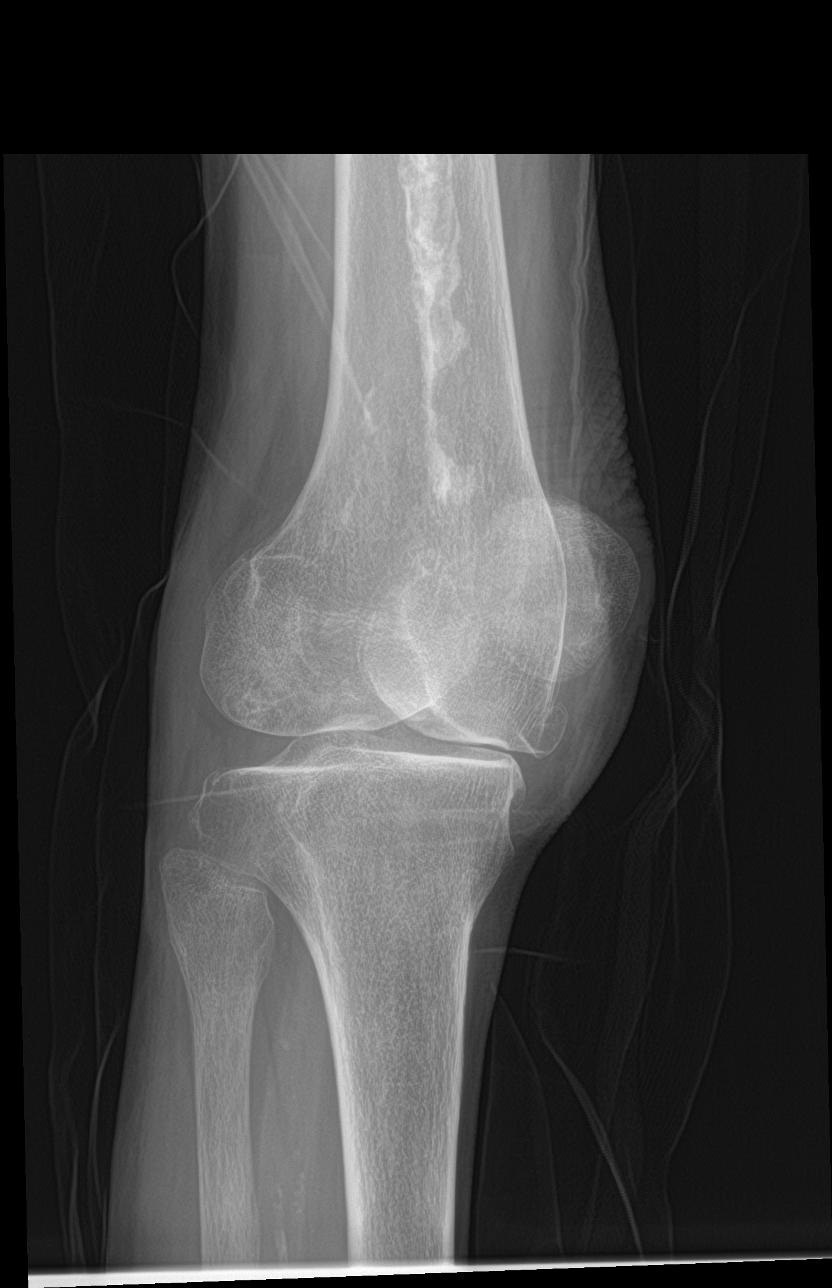

[knee lat]
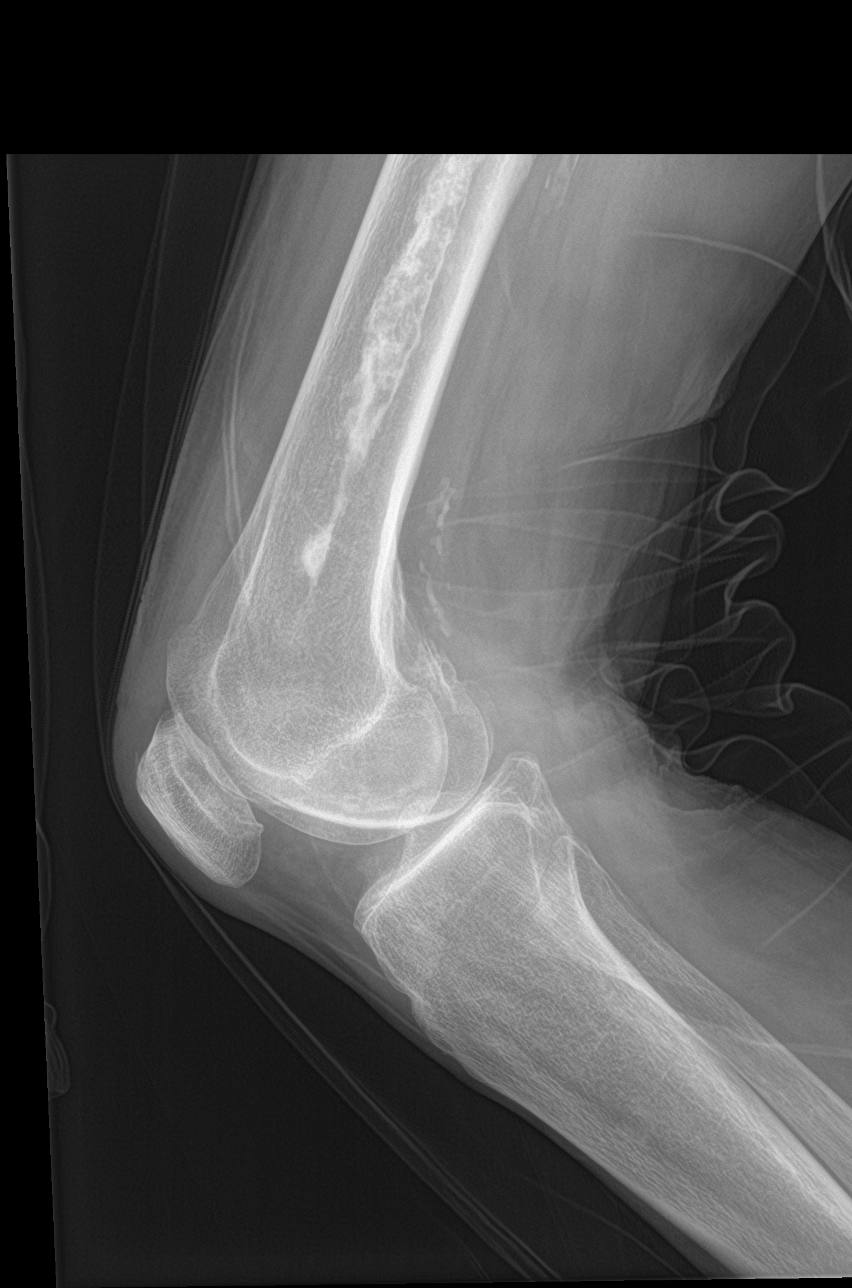

[4 of 4 positions shown; findings below may reference images not displayed]

FINDINGS: Sclerotic areas in the mid to distal femoral shaft compatible with
old bone infarcts. Joint space narrowing in the medial and
patellofemoral compartments with mild spurring. No joint effusion.
No acute bony abnormality. Specifically, no fracture, subluxation,
or dislocation.
IMPRESSION: Mild degenerative changes.

Old mid to distal femoral bone infarcts.

No acute bony abnormality.

## 2023-09-20 ENCOUNTER — Other Ambulatory Visit: Payer: Self-pay | Admitting: Nurse Practitioner

## 2023-09-20 DIAGNOSIS — R451 Restlessness and agitation: Secondary | ICD-10-CM

## 2023-09-21 NOTE — Telephone Encounter (Signed)
Requested medications are due for refill today.  yes  Requested medications are on the active medications list.  yes  Last refill. 05/22/2023 #30 3 rf  Future visit scheduled.   no  Notes to clinic.  Refill not delegated.    Requested Prescriptions  Pending Prescriptions Disp Refills   QUEtiapine (SEROQUEL) 25 MG tablet [Pharmacy Med Name: QUETIAPINE FUMARATE 25 MG TAB] 30 tablet 3    Sig: TAKE 1 TABLET BY MOUTH AT BEDTIME     Not Delegated - Psychiatry:  Antipsychotics - Second Generation (Atypical) - quetiapine Failed - 09/20/2023  2:04 PM      Failed - This refill cannot be delegated      Failed - TSH in normal range and within 360 days    No results found for: "TSH", "POCTSH", "TSHREFLEX"       Failed - Last BP in normal range    BP Readings from Last 1 Encounters:  07/12/23 (!) 160/75         Failed - Lipid Panel in normal range within the last 12 months    Cholesterol  Date Value Ref Range Status  07/11/2023 141 0 - 200 mg/dL Final   LDL Cholesterol (Calc)  Date Value Ref Range Status  04/15/2023 200 (H) mg/dL (calc) Final    Comment:    LDL-C levels > or = 190 mg/dL may indicate familial  hypercholesterolemia (FH). Clinical assessment and  measurement of blood lipid levels should be  considered for all first degree relatives of patients with an FH diagnosis. LDL Cholesterol (LDL-C) levels > or = 300 mg/dL may indicate homozygous familial hypercholesterolemia (HoFH). Untreated,  these extremely high LDL-C levels can result in premature CV events and mortality. Patients should be identified early and provided appropriate interventions to reduce the cumulative LDL-C burden from birth. . For questions about testing for familial hypercholesterolemia, please call Engineer, materials at [email or web address]. Wardell Honour, et al. J National Lipid Association  Recommendations for Patient-Centered Management of Dyslipidemia: Part 1 Journal of  Clinical  Lipidology 2015;9(2), 129-169. Cuchel, M. et al. (2014). Homozygous familial hypercholesterolaemia: new insights and guidance for clinicians to improve detection and clinical management. European Heart Journal, 35(32), [phone]. Reference range: <100 . Desirable range <100 mg/dL for primary prevention;   <70 mg/dL for patients with CHD or diabetic patients  with > or = 2 CHD risk factors. Marland Kitchen LDL-C is now calculated using the Martin-Hopkins  calculation, which is a validated novel method providing  better accuracy than the Friedewald equation in the  estimation of LDL-C.  Horald Pollen et al. Lenox Ahr. 1308;657(84): 2061-2068  (http://education.[facility].com/faq/FAQ164)    LDL Cholesterol  Date Value Ref Range Status  07/11/2023 76 0 - 99 mg/dL Final    Comment:           Total Cholesterol/HDL:CHD Risk Coronary Heart Disease Risk Table                     Men   Women  1/2 Average Risk   3.4   3.3  Average Risk       5.0   4.4  2 X Average Risk   9.6   7.1  3 X Average Risk  23.4   11.0        Use the calculated Patient Ratio above and the CHD Risk Table to determine the patient's CHD Risk.        ATP III CLASSIFICATION (LDL):  <100  mg/dL   Optimal  782-956  mg/dL   Near or Above                    Optimal  130-159  mg/dL   Borderline  213-086  mg/dL   High  >578     mg/dL   Very High Performed at [facility], [address] [place], [place] [address]    HDL  Date Value Ref Range Status  07/11/2023 55 >40 mg/dL Final   Triglycerides  Date Value Ref Range Status  07/11/2023 48 <150 mg/dL Final         Passed - Completed PHQ-2 or PHQ-9 in the last 360 days      Passed - Last Heart Rate in normal range    Pulse Readings from Last 1 Encounters:  07/12/23 67         Passed - Valid encounter within last 6 months    Recent Outpatient Visits           2 months ago Erroneous encounter - disregard   [facility] Berniece Salines, FNP   3 months ago Urinary frequency   [facility] Berniece Salines, FNP   5 months ago Primary hypertension   [facility] Berniece Salines, FNP   6 months ago Agitation   [facility] Mecum, Oswaldo Conroy, PA-C   1 year ago Chronic bilateral low back pain without sciatica   [facility] Berniece Salines, FNP              Passed - CBC within normal limits and completed in the last 12 months    WBC  Date Value Ref Range Status  07/12/2023 5.6 4.0 - 10.5 K/uL Final   RBC  Date Value Ref Range Status  07/12/2023 4.40 3.87 - 5.11 MIL/uL Final   Hemoglobin  Date Value Ref Range Status  07/12/2023 13.1 12.0 - 15.0 g/dL Final   HCT  Date Value Ref Range Status  07/12/2023 37.5 36.0 - 46.0 % Final   MCHC  Date Value Ref Range Status  07/12/2023 34.9 30.0 - 36.0 g/dL Final   [facility]  Date Value Ref Range Status  07/12/2023 29.8 26.0 - 34.0 pg Final   MCV  Date Value Ref Range Status  07/12/2023 85.2 80.0 - 100.0 fL Final   No results found for: "PLTCOUNTKUC", "LABPLAT", "POCPLA" RDW  Date Value Ref Range Status  07/12/2023 14.2 11.5 - 15.5 % Final         Passed - CMP within normal limits and completed in the last 12 months    Albumin  Date Value Ref Range Status  07/10/2023 3.6 3.5 - 5.0 g/dL Final   Alkaline Phosphatase  Date Value Ref Range Status  07/10/2023 63 38 - 126 U/L Final   Alkaline phosphatase (APISO)  Date Value Ref Range Status  04/15/2023 85 37 - 153 U/L Final   ALT  Date Value Ref Range Status  07/10/2023 17 0 - 44 U/L Final   AST  Date Value Ref Range Status  07/10/2023 26 15 - 41 U/L Final   BUN  Date Value Ref Range Status  07/12/2023 23 8 - 23 mg/dL Final   Calcium  Date Value Ref Range Status  07/12/2023 8.6 (L) 8.9 - 10.3 mg/dL Final   CO2  Date Value Ref Range Status  07/12/2023 20 (L)  22 - 32 mmol/L Final   Creat   Date Value Ref Range Status  04/15/2023 1.05 (H) 0.60 - 1.00 mg/dL Final   Creatinine, Ser  Date Value Ref Range Status  07/12/2023 0.81 0.44 - 1.00 mg/dL Final   Glucose, Bld  Date Value Ref Range Status  07/12/2023 117 (H) 70 - 99 mg/dL Final    Comment:    Glucose reference range applies only to samples taken after fasting for at least 8 hours.   Potassium  Date Value Ref Range Status  07/12/2023 4.0 3.5 - 5.1 mmol/L Final   Sodium  Date Value Ref Range Status  07/12/2023 138 135 - 145 mmol/L Final   Total Bilirubin  Date Value Ref Range Status  07/10/2023 0.5 0.3 - 1.2 mg/dL Final   Protein, ur  Date Value Ref Range Status  07/11/2023 NEGATIVE NEGATIVE mg/dL Final   Total Protein  Date Value Ref Range Status  07/10/2023 6.2 (L) 6.5 - 8.1 g/dL Final   GFR calc Af Amer  Date Value Ref Range Status  08/06/2019 >60 >60 mL/min Final   eGFR  Date Value Ref Range Status  04/15/2023 54 (L) > OR = 60 mL/min/1.37m2 Final   GFR, Estimated  Date Value Ref Range Status  07/12/2023 >60 >60 mL/min Final    Comment:    (NOTE) Calculated using the CKD-EPI Creatinine Equation (2021)

## 2023-11-18 ENCOUNTER — Ambulatory Visit: Payer: Medicare (Managed Care) | Admitting: Physician Assistant

## 2023-11-19 ENCOUNTER — Ambulatory Visit: Payer: Medicare (Managed Care) | Admitting: Physician Assistant

## 2023-11-19 VITALS — BP 150/92 | HR 91 | Resp 16 | Ht 63.0 in | Wt 89.8 lb

## 2023-11-19 DIAGNOSIS — R0989 Other specified symptoms and signs involving the circulatory and respiratory systems: Secondary | ICD-10-CM

## 2023-11-19 DIAGNOSIS — R350 Frequency of micturition: Secondary | ICD-10-CM | POA: Diagnosis not present

## 2023-11-19 DIAGNOSIS — J019 Acute sinusitis, unspecified: Secondary | ICD-10-CM

## 2023-11-19 LAB — POCT URINALYSIS DIPSTICK
Appearance: NORMAL
Bilirubin, UA: NEGATIVE
Blood, UA: NEGATIVE
Glucose, UA: NEGATIVE
Ketones, UA: NEGATIVE
Leukocytes, UA: NEGATIVE
Nitrite, UA: NEGATIVE
Protein, UA: NEGATIVE
Spec Grav, UA: 1.015 (ref 1.010–1.025)
Urobilinogen, UA: 0.2 U/dL
pH, UA: 6.5 (ref 5.0–8.0)

## 2023-11-19 MED ORDER — AMOXICILLIN-POT CLAVULANATE 875-125 MG PO TABS: 1.0000 | ORAL_TABLET | Freq: Two times a day (BID) | ORAL | 0 refills | Status: DC

## 2023-11-19 NOTE — Progress Notes (Unsigned)
Acute Office Visit   Patient: Katherine Price   DOB: [birth year]   77 y.o. Female  MRN: [id number] Visit Date: 11/19/2023  Today's healthcare provider: Oswaldo Conroy Joao Mccurdy, PA-C  Introduced myself to the patient as a Secondary school teacher and provided education on APPs in clinical practice.    Chief Complaint  Patient presents with   Cough    X2 weeks. Non-productive, worsening   Subjective    HPI HPI     Cough    Additional comments: X2 weeks. Non-productive, worsening      Last edited by Dollene Primrose, CMA on 11/19/2023  1:24 PM.      She is here with her caregiver who is assisting with HPI   Concern for ongoing cough that is not getting better Her caregiver states she is not drinking water and she is concerned patient has a UTI Her caregiver reports increased urinary frequency   She reports she has been taking Advil to help with her symptoms  Medications: Outpatient Medications Prior to Visit  Medication Sig   ASPIRIN LOW DOSE 81 MG tablet TAKE 1 TABLET BY MOUTH ONCE DAILY SWALLOW WHOLE   atorvastatin (LIPITOR) 20 MG tablet TAKE 1 TABLET BY MOUTH ONCE DAILY   enalapril (VASOTEC) 20 MG tablet TAKE 1 TABLET BY MOUTH ONCE DAILY   QUEtiapine (SEROQUEL) 25 MG tablet TAKE 1 TABLET BY MOUTH AT BEDTIME   acetaminophen (TYLENOL) 325 MG tablet Take 2 tablets (650 mg total) by mouth every 6 (six) hours as needed for mild pain. (Patient not taking: Reported on 11/19/2023)   benzonatate (TESSALON) 100 MG capsule Take 2 capsules (200 mg total) by mouth 2 (two) times daily as needed for cough. (Patient not taking: Reported on 07/13/2023)   docusate sodium (COLACE) 100 MG capsule Take 1 capsule (100 mg total) by mouth daily as needed for mild constipation. (Patient not taking: Reported on 11/19/2023)   promethazine-dextromethorphan (PROMETHAZINE-DM) 6.25-15 MG/5ML syrup Take 5 mLs by mouth at bedtime as needed for cough. (Patient not taking: Reported on 07/13/2023)   senna (SENOKOT) 8.6 MG TABS  tablet Take 1 tablet (8.6 mg total) by mouth daily as needed for mild constipation. Until having soft daily stools (Patient not taking: Reported on 11/19/2023)   No facility-administered medications prior to visit.    Review of Systems  Constitutional:  Positive for fatigue. Negative for chills and fever.  HENT:  Positive for postnasal drip, rhinorrhea and sinus pressure. Negative for congestion, ear pain, sinus pain and sore throat.   Respiratory:  Positive for cough. Negative for shortness of breath and wheezing.   Gastrointestinal:  Negative for diarrhea, nausea and vomiting.  Genitourinary:  Positive for frequency. Negative for dysuria and flank pain.    {Insert previous labs (optional):23779} {See past labs  Heme  Chem  Endocrine  Serology  Results Review (optional):1}   Objective    BP (!) 150/92   Pulse 91   Resp 16   Ht 5\' 3"  (1.6 m)   Wt 89 lb 12.8 oz (40.7 kg)   SpO2 98%   BMI 15.91 kg/m  {Insert last BP/Wt (optional):23777}{See vitals history (optional):1}   Physical Exam Vitals reviewed.  Constitutional:      General: She is awake.     Appearance: Normal appearance. She is well-developed and well-groomed.  HENT:     Head: Normocephalic and atraumatic.     Right Ear: There is impacted cerumen.     Left Ear:  There is impacted cerumen.     Nose: Rhinorrhea present. Rhinorrhea is clear.     Mouth/Throat:     Lips: Pink.     Mouth: Mucous membranes are moist.     Pharynx: Uvula midline. No pharyngeal swelling, oropharyngeal exudate, posterior oropharyngeal erythema or uvula swelling.  Cardiovascular:     Rate and Rhythm: Normal rate and regular rhythm.  Pulmonary:     Effort: Pulmonary effort is normal.     Breath sounds: Decreased air movement present. Decreased breath sounds and rales present. No wheezing or rhonchi.  Musculoskeletal:     Cervical back: Normal range of motion.  Neurological:     Mental Status: She is alert.  Psychiatric:         Behavior: Behavior is cooperative.       No results found for any visits on 11/19/23.  Assessment & Plan      No follow-ups on file.      Problem List Items Addressed This Visit   None    No follow-ups on file.   I, Shin Lamour E Yvonnia Tango, PA-C, have reviewed all documentation for this visit. The documentation on 11/19/23 for the exam, diagnosis, procedures, and orders are all accurate and complete.   Jacquelin Hawking, MHS, PA-C [facility]

## 2023-11-19 NOTE — Patient Instructions (Signed)
Based on your symptoms and duration of illness, I believe you may have a bacterial sinus infection  These typically resolve with antibiotic therapy along with at-home comfort measures  Today I have sent in a prescription for Augmentin 875-125 mg to be taken by mouth twice per day for 7 days FINISH THE ENTIRE COURSE unless you develop an allergic reaction or are instructed to discontinue.  It can take a few days for the antibiotic to kick in so I recommend symptomatic relief with over the counter medication such as the following: Dayquil/ Nyquil Theraflu Alkaseltzer  Coricidin - if you have high blood pressure even if it is well managed with medications  I also recommend adding an antihistamine to your daily regimen This includes medications like Claritin, Allegra, Zyrtec- the generics of these work very well and are usually less expensive I recommend using Flonase nasal spray - 2 puffs twice per day to help with your nasal congestion The antihistamines and Flonase can take a few weeks to provide significant relief from allergy symptoms but should start to provide some benefit soon.   These medications typically have Tylenol in them already so you  do not need to supplement with more outside of those medications  Stay well hydrated with at least 75 oz of water per day to help with recovery  If you notice any of the following please let us know: increased fever not responding to Tylenol or Ibuprofen, swelling around your nose or eyes, difficulty seeing,

## 2023-11-19 NOTE — Progress Notes (Signed)
Your urine dip was normal.  No signs indicating UTI at this time

## 2023-12-31 ENCOUNTER — Other Ambulatory Visit: Payer: Self-pay | Admitting: Nurse Practitioner

## 2023-12-31 DIAGNOSIS — I10 Essential (primary) hypertension: Secondary | ICD-10-CM

## 2023-12-31 NOTE — Telephone Encounter (Signed)
Requested Prescriptions  Pending Prescriptions Disp Refills   atorvastatin (LIPITOR) 20 MG tablet [Pharmacy Med Name: ATORVASTATIN CALCIUM 20 MG TAB] 90 tablet 1    Sig: TAKE 1 TABLET BY MOUTH ONCE DAILY     Cardiovascular:  Antilipid - Statins Failed - 12/31/2023  4:14 PM      Failed - Lipid Panel in normal range within the last 12 months    Cholesterol  Date Value Ref Range Status  07/11/2023 141 0 - 200 mg/dL Final   LDL Cholesterol (Calc)  Date Value Ref Range Status  04/15/2023 200 (H) mg/dL (calc) Final    Comment:    LDL-C levels > or = 190 mg/dL may indicate familial  hypercholesterolemia (FH). Clinical assessment and  measurement of blood lipid levels should be  considered for all first degree relatives of patients with an FH diagnosis. LDL Cholesterol (LDL-C) levels > or = 300 mg/dL may indicate homozygous familial hypercholesterolemia (HoFH). Untreated,  these extremely high LDL-C levels can result in premature CV events and mortality. Patients should be identified early and provided appropriate interventions to reduce the cumulative LDL-C burden from birth. . For questions about testing for familial hypercholesterolemia, please call Engineer, materials at [email or web address]. Wardell Honour, et al. J National Lipid Association  Recommendations for Patient-Centered Management of Dyslipidemia: Part 1 Journal of  Clinical Lipidology 2015;9(2), 129-169. Cuchel, M. et al. (2014). Homozygous familial hypercholesterolaemia: new insights and guidance for clinicians to improve detection and clinical management. European Heart Journal, 35(32), [phone]. Reference range: <100 . Desirable range <100 mg/dL for primary prevention;   <70 mg/dL for patients with CHD or diabetic patients  with > or = 2 CHD risk factors. Marland Kitchen LDL-C is now calculated using the Martin-Hopkins  calculation, which is a validated novel method providing  better accuracy than the Friedewald  equation in the  estimation of LDL-C.  Horald Pollen et al. Lenox Ahr. 8413;244(01): 2061-2068  (http://education.[facility].com/faq/FAQ164)    LDL Cholesterol  Date Value Ref Range Status  07/11/2023 76 0 - 99 mg/dL Final    Comment:           Total Cholesterol/HDL:CHD Risk Coronary Heart Disease Risk Table                     Men   Women  1/2 Average Risk   3.4   3.3  Average Risk       5.0   4.4  2 X Average Risk   9.6   7.1  3 X Average Risk  23.4   11.0        Use the calculated Patient Ratio above and the CHD Risk Table to determine the patient's CHD Risk.        ATP III CLASSIFICATION (LDL):  <100     mg/dL   Optimal  027-253  mg/dL   Near or Above                    Optimal  130-159  mg/dL   Borderline  664-403  mg/dL   High  >474     mg/dL   Very High Performed at [facility], [address] [place], [place] [address]    HDL  Date Value Ref Range Status  07/11/2023 55 >40 mg/dL Final   Triglycerides  Date Value Ref Range Status  07/11/2023 48 <150 mg/dL Final         Passed - Patient is not pregnant  Passed - Valid encounter within last 12 months    Recent Outpatient Visits           1 month ago Acute sinusitis, recurrence not specified, unspecified location   [facility] Mecum, Oswaldo Conroy, PA-C   5 months ago Erroneous encounter - disregard   [facility] Berniece Salines, FNP   7 months ago Urinary frequency   [facility] Della Goo F, FNP   8 months ago Primary hypertension   [facility] Berniece Salines, FNP   10 months ago Agitation   [facility] Mecum, Erin E, PA-C               enalapril (VASOTEC) 20 MG tablet [Pharmacy Med Name: ENALAPRIL MALEATE 20 MG TAB] 90 tablet 0    Sig: TAKE 1 TABLET BY MOUTH ONCE DAILY     Cardiovascular:  ACE Inhibitors Failed - 12/31/2023  4:14 PM      Failed -  Last BP in normal range    BP Readings from Last 1 Encounters:  11/19/23 (!) 150/92         Passed - Cr in normal range and within 180 days    Creat  Date Value Ref Range Status  04/15/2023 1.05 (H) 0.60 - 1.00 mg/dL Final   Creatinine, Ser  Date Value Ref Range Status  07/12/2023 0.81 0.44 - 1.00 mg/dL Final         Passed - K in normal range and within 180 days    Potassium  Date Value Ref Range Status  07/12/2023 4.0 3.5 - 5.1 mmol/L Final         Passed - Patient is not pregnant      Passed - Valid encounter within last 6 months    Recent Outpatient Visits           1 month ago Acute sinusitis, recurrence not specified, unspecified location   [facility] Mecum, Oswaldo Conroy, PA-C   5 months ago Erroneous encounter - disregard   [facility] Berniece Salines, FNP   7 months ago Urinary frequency   [facility] Berniece Salines, FNP   8 months ago Primary hypertension   [facility] Berniece Salines, FNP   10 months ago Agitation   [facility] Mecum, Erin E, PA-C               aspirin EC (ASPIRIN EC ADULT LOW DOSE) 81 MG tablet [Pharmacy Med Name: ASPIRIN EC ADULT LOW DOSE 81 MG DR] 90 tablet 1    Sig: TAKE 1 TABLET BY MOUTH ONCE DAILY SWALLOW WHOLE     Analgesics:  NSAIDS - aspirin Passed - 12/31/2023  4:14 PM      Passed - Cr in normal range and within 360 days    Creat  Date Value Ref Range Status  04/15/2023 1.05 (H) 0.60 - 1.00 mg/dL Final   Creatinine, Ser  Date Value Ref Range Status  07/12/2023 0.81 0.44 - 1.00 mg/dL Final         Passed - eGFR is 10 or above and within 360 days    GFR calc Af Amer  Date Value Ref Range Status  08/06/2019 >60 >60 mL/min Final   GFR, Estimated  Date Value Ref Range Status  07/12/2023 >60 >60 mL/min Final  Comment:    (NOTE) Calculated using the CKD-EPI Creatinine Equation (2021)     eGFR  Date Value Ref Range Status  04/15/2023 54 (L) > OR = 60 mL/min/1.67m2 Final         Passed - Patient is not pregnant      Passed - Valid encounter within last 12 months    Recent Outpatient Visits           1 month ago Acute sinusitis, recurrence not specified, unspecified location   [facility] Mecum, Oswaldo Conroy, PA-C   5 months ago Erroneous encounter - disregard   [facility] Berniece Salines, FNP   7 months ago Urinary frequency   [facility] Berniece Salines, FNP   8 months ago Primary hypertension   [facility] Berniece Salines, FNP   10 months ago Agitation   [facility] Mecum, Oswaldo Conroy, [place]

## 2024-01-19 ENCOUNTER — Other Ambulatory Visit: Payer: Self-pay | Admitting: Nurse Practitioner

## 2024-01-19 DIAGNOSIS — R451 Restlessness and agitation: Secondary | ICD-10-CM

## 2024-01-20 NOTE — Telephone Encounter (Signed)
Requested medication (s) are due for refill today: Yes  Requested medication (s) are on the active medication list: Yes  Last refill:  09/27/23  Future visit scheduled: Yes  Notes to clinic:  Not delegated.    Requested Prescriptions  Pending Prescriptions Disp Refills   QUEtiapine (SEROQUEL) 25 MG tablet [Pharmacy Med Name: QUETIAPINE FUMARATE 25 MG TAB] 30 tablet 3    Sig: TAKE 1 TABLET BY MOUTH AT BEDTIME     Not Delegated - Psychiatry:  Antipsychotics - Second Generation (Atypical) - quetiapine Failed - 01/20/2024 11:54 AM      Failed - This refill cannot be delegated      Failed - TSH in normal range and within 360 days    No results found for: "TSH", "POCTSH", "TSHREFLEX"       Failed - Last BP in normal range    BP Readings from Last 1 Encounters:  11/19/23 (!) 150/92         Failed - Lipid Panel in normal range within the last 12 months    Cholesterol  Date Value Ref Range Status  07/11/2023 141 0 - 200 mg/dL Final   LDL Cholesterol (Calc)  Date Value Ref Range Status  04/15/2023 200 (H) mg/dL (calc) Final    Comment:    LDL-C levels > or = 190 mg/dL may indicate familial  hypercholesterolemia (FH). Clinical assessment and  measurement of blood lipid levels should be  considered for all first degree relatives of patients with an FH diagnosis. LDL Cholesterol (LDL-C) levels > or = 300 mg/dL may indicate homozygous familial hypercholesterolemia (HoFH). Untreated,  these extremely high LDL-C levels can result in premature CV events and mortality. Patients should be identified early and provided appropriate interventions to reduce the cumulative LDL-C burden from birth. . For questions about testing for familial hypercholesterolemia, please call Engineer, materials at [email or web address].[email or web address]. Wardell Honour, et al. J National Lipid Association  Recommendations for Patient-Centered Management of Dyslipidemia: Part 1 Journal of  Clinical Lipidology  2015;9(2), 129-169. Cuchel, M. et al. (2014). Homozygous familial hypercholesterolaemia: new insights and guidance for clinicians to improve detection and clinical management. European Heart Journal, 35(32), [phone]. Reference range: <100 . Desirable range <100 mg/dL for primary prevention;   <70 mg/dL for patients with CHD or diabetic patients  with > or = 2 CHD risk factors. Marland Kitchen LDL-C is now calculated using the Martin-Hopkins  calculation, which is a validated novel method providing  better accuracy than the Friedewald equation in the  estimation of LDL-C.  Horald Pollen et al. Lenox Ahr. 5409;811(91): 2061-2068  (http://education.[facility].com/faq/FAQ164)    LDL Cholesterol  Date Value Ref Range Status  07/11/2023 76 0 - 99 mg/dL Final    Comment:           Total Cholesterol/HDL:CHD Risk Coronary Heart Disease Risk Table                     Men   Women  1/2 Average Risk   3.4   3.3  Average Risk       5.0   4.4  2 X Average Risk   9.6   7.1  3 X Average Risk  23.4   11.0        Use the calculated Patient Ratio above and the CHD Risk Table to determine the patient's CHD Risk.        ATP III CLASSIFICATION (LDL):  <100     mg/dL   Optimal  100-129  mg/dL   Near or Above                    Optimal  130-159  mg/dL   Borderline  478-295  mg/dL   High  >621     mg/dL   Very High Performed at [facility], [address] [place], [place] [address]    HDL  Date Value Ref Range Status  07/11/2023 55 >40 mg/dL Final   Triglycerides  Date Value Ref Range Status  07/11/2023 48 <150 mg/dL Final         Passed - Completed PHQ-2 or PHQ-9 in the last 360 days      Passed - Last Heart Rate in normal range    Pulse Readings from Last 1 Encounters:  11/19/23 91         Passed - Valid encounter within last 6 months    Recent Outpatient Visits           2 months ago Acute sinusitis, recurrence not specified, unspecified location   [facility] Mecum, Oswaldo Conroy, PA-C   6 months ago Erroneous encounter - disregard   [facility] Berniece Salines, FNP   7 months ago Urinary frequency   [facility] Berniece Salines, FNP   9 months ago Primary hypertension   [facility] Berniece Salines, FNP   10 months ago Agitation   [facility] Mecum, Erin E, PA-C              Passed - CBC within normal limits and completed in the last 12 months    WBC  Date Value Ref Range Status  07/12/2023 5.6 4.0 - 10.5 K/uL Final   RBC  Date Value Ref Range Status  07/12/2023 4.40 3.87 - 5.11 MIL/uL Final   Hemoglobin  Date Value Ref Range Status  07/12/2023 13.1 12.0 - 15.0 g/dL Final   HCT  Date Value Ref Range Status  07/12/2023 37.5 36.0 - 46.0 % Final   MCHC  Date Value Ref Range Status  07/12/2023 34.9 30.0 - 36.0 g/dL Final   [facility]  Date Value Ref Range Status  07/12/2023 29.8 26.0 - 34.0 pg Final   MCV  Date Value Ref Range Status  07/12/2023 85.2 80.0 - 100.0 fL Final   No results found for: "PLTCOUNTKUC", "LABPLAT", "POCPLA" RDW  Date Value Ref Range Status  07/12/2023 14.2 11.5 - 15.5 % Final         Passed - CMP within normal limits and completed in the last 12 months    Albumin  Date Value Ref Range Status  07/10/2023 3.6 3.5 - 5.0 g/dL Final   Alkaline Phosphatase  Date Value Ref Range Status  07/10/2023 63 38 - 126 U/L Final   Alkaline phosphatase (APISO)  Date Value Ref Range Status  04/15/2023 85 37 - 153 U/L Final   ALT  Date Value Ref Range Status  07/10/2023 17 0 - 44 U/L Final   AST  Date Value Ref Range Status  07/10/2023 26 15 - 41 U/L Final   BUN  Date Value Ref Range Status  07/12/2023 23 8 - 23 mg/dL Final   Calcium  Date Value Ref Range Status  07/12/2023 8.6 (L) 8.9 - 10.3 mg/dL Final   CO2  Date Value Ref Range Status  07/12/2023 20 (L) 22 - 32 mmol/L Final  Creat  Date Value Ref Range Status  04/15/2023 1.05 (H) 0.60 - 1.00 mg/dL Final   Creatinine, Ser  Date Value Ref Range Status  07/12/2023 0.81 0.44 - 1.00 mg/dL Final   Glucose, Bld  Date Value Ref Range Status  07/12/2023 117 (H) 70 - 99 mg/dL Final    Comment:    Glucose reference range applies only to samples taken after fasting for at least 8 hours.   Potassium  Date Value Ref Range Status  07/12/2023 4.0 3.5 - 5.1 mmol/L Final   Sodium  Date Value Ref Range Status  07/12/2023 138 135 - 145 mmol/L Final   Total Bilirubin  Date Value Ref Range Status  07/10/2023 0.5 0.3 - 1.2 mg/dL Final   Protein, ur  Date Value Ref Range Status  07/11/2023 NEGATIVE NEGATIVE mg/dL Final   Protein, UA  Date Value Ref Range Status  11/19/2023 Negative Negative Final   Total Protein  Date Value Ref Range Status  07/10/2023 6.2 (L) 6.5 - 8.1 g/dL Final   GFR calc Af Amer  Date Value Ref Range Status  08/06/2019 >60 >60 mL/min Final   eGFR  Date Value Ref Range Status  04/15/2023 54 (L) > OR = 60 mL/min/1.100m2 Final   GFR, Estimated  Date Value Ref Range Status  07/12/2023 >60 >60 mL/min Final    Comment:    (NOTE) Calculated using the CKD-EPI Creatinine Equation (2021)

## 2024-01-31 ENCOUNTER — Ambulatory Visit: Payer: Self-pay | Admitting: Nurse Practitioner

## 2024-01-31 NOTE — Telephone Encounter (Signed)
 Copied from CRM [id number]. Topic: Clinical - Red Word Triage >> Jan 31, 2024  8:38 AM Fuller Mandril wrote: Red Word that prompted transfer to Nurse Triage: Talking strange, not acting herself, possible uti  Chief Complaint: 1 episode forgetfulness? UTI Symptoms: urinary frequency Frequency: last week Pertinent Negatives: Patient denies feer, blood in urine Disposition: [] ED /[] Urgent Care (no appt availability in office) / [x] Appointment(In office/virtual)/ []  [facility]/ [] Home Care/ [] Refused Recommended Disposition /[] [facility]/ []  Follow-up with PCP Additional Notes:   Reason for Disposition  New or worsening agitation or behavior problem (e.g., change from patient's normal pattern)  Answer Assessment - Initial Assessment Questions 1. MAIN CONCERN OR SYMPTOM:  "What is your main concern right now?" "What questions do you have?" "What's the main symptom you're worried about?" (e.g., confusion, memory loss)     She walked into her daughters room and stated this is not her room. Happened last week none since 2. ONSET:  "When did the symptom start (or worsen)?" (minutes, hours, days, weeks)     1 episode last week 3. BETTER-SAME-WORSE: "Are you (the patient) getting better, staying the same, or getting worse compared to the day you (they) were diagnosed or most recent hospital discharge ?"     same 4. DIAGNOSIS: "Was the dementia diagnosed by a doctor?" If Yes, ask: "When?" (e.g., days, months, years ago)     N/a 5. MEDICINES: "Has there been any change in medicines recently?" (e.g., narcotics, antihistamines, benzodiazepines, etc.)     N/a 6. OTHER SYMPTOMS: "Are there any other symptoms?" (e.g., fever, cough, pain, falling)     Urinary frequency "pt's baseline" 7. SUPPORT: Document living circumstances and support (e.g., family, nursing home)     Caregiver thinks UTI  Protocols used: Dementia Symptoms and Questions-A-AH

## 2024-01-31 NOTE — Telephone Encounter (Signed)
 Tried to call to triage, left vm  Wanted to make sure no stroke symptoms

## 2024-01-31 NOTE — Progress Notes (Unsigned)
   There were no vitals taken for this visit.   Subjective:    Patient ID: Katherine Price, female    DOB: [birth year], 78 y.o.   MRN: [id number]  HPI: Katherine Price is a 78 y.o. female  No chief complaint on file.   Discussed the use of AI scribe software for clinical note transcription with the patient, who gave verbal consent to proceed.  History of Present Illness           05/31/2023   10:13 AM 04/15/2023    2:04 PM 04/15/2023    1:17 PM  Depression screen PHQ 2/9  Decreased Interest 0 0 0  Down, Depressed, Hopeless 0 0 0  PHQ - 2 Score 0 0 0  Altered sleeping   0  Tired, decreased energy   0  Change in appetite   0  Feeling bad or failure about yourself    0  Trouble concentrating   0  Moving slowly or fidgety/restless   0  Suicidal thoughts   0  PHQ-9 Score   0  Difficult doing work/chores  Not difficult at all Not difficult at all    Relevant past medical, surgical, family and social history reviewed and updated as indicated. Interim medical history since our last visit reviewed. Allergies and medications reviewed and updated.  Review of Systems  Per HPI unless specifically indicated above     Objective:    There were no vitals taken for this visit.  {Vitals History (Optional):23777} Wt Readings from Last 3 Encounters:  11/19/23 89 lb 12.8 oz (40.7 kg)  07/10/23 95 lb 10.9 oz (43.4 kg)  05/31/23 95 lb 3.2 oz (43.2 kg)    Physical Exam  Results for orders placed or performed in visit on 11/19/23  POCT Urinalysis Dipstick   Collection Time: 11/19/23  1:59 PM  Result Value Ref Range   Color, UA Yellow    Clarity, UA Clear    Glucose, UA Negative Negative   Bilirubin, UA Negative    Ketones, UA Negative    Spec Grav, UA 1.015 1.010 - 1.025   Blood, UA Negative    pH, UA 6.5 5.0 - 8.0   Protein, UA Negative Negative   Urobilinogen, UA 0.2 0.2 or 1.0 E.U./dL   Nitrite, UA Negative    Leukocytes, UA Negative Negative   Appearance Normal    Odor  Strong    {Labs (Optional):23779}    Assessment & Plan:   Problem List Items Addressed This Visit   None    Assessment and Plan             Follow up plan: No follow-ups on file.

## 2024-02-01 ENCOUNTER — Emergency Department
Admission: EM | Admit: 2024-02-01 | Discharge: 2024-02-01 | Disposition: A | Discharge status: Home / Self Care | Attending: Emergency Medicine | Admitting: Emergency Medicine

## 2024-02-01 ENCOUNTER — Emergency Department

## 2024-02-01 ENCOUNTER — Encounter: Payer: Self-pay | Admitting: Nurse Practitioner

## 2024-02-01 ENCOUNTER — Ambulatory Visit (INDEPENDENT_AMBULATORY_CARE_PROVIDER_SITE_OTHER): Admitting: Nurse Practitioner

## 2024-02-01 VITALS — BP 122/60 | HR 80 | Temp 97.7°F | Resp 14 | Ht 63.0 in | Wt 90.5 lb

## 2024-02-01 DIAGNOSIS — R410 Disorientation, unspecified: Secondary | ICD-10-CM

## 2024-02-01 LAB — BASIC METABOLIC PANEL
Anion gap: 11 (ref 5–15)
BUN: 22 mg/dL (ref 8–23)
CO2: 23 mmol/L (ref 22–32)
Calcium: 9.4 mg/dL (ref 8.9–10.3)
Chloride: 106 mmol/L (ref 98–111)
Creatinine, Ser: 0.95 mg/dL (ref 0.44–1.00)
GFR, Estimated: >60 mL/min (ref >60)
Glucose, Bld: 81 mg/dL (ref 70–99)
Potassium: 3.6 mmol/L (ref 3.5–5.1)
Sodium: 140 mmol/L (ref 135–145)

## 2024-02-01 LAB — CBC WITH DIFFERENTIAL/PLATELET
Abs Immature Granulocytes: 0.01 10*3/uL (ref 0.00–0.07)
Basophils Absolute: 0.1 10*3/uL (ref 0.0–0.1)
Basophils Relative: 1 %
Eosinophils Absolute: 0.1 10*3/uL (ref 0.0–0.5)
Eosinophils Relative: 2 %
HCT: 33.9 % — ABNORMAL LOW (ref 36.0–46.0)
Hemoglobin: 11.5 g/dL — ABNORMAL LOW (ref 12.0–15.0)
Immature Granulocytes: 0 %
Lymphocytes Relative: 27 %
Lymphs Abs: 1.8 10*3/uL (ref 0.7–4.0)
MCH: 31.1 pg (ref 26.0–34.0)
MCHC: 33.9 g/dL (ref 30.0–36.0)
MCV: 91.6 fL (ref 80.0–100.0)
Monocytes Absolute: 0.4 10*3/uL (ref 0.1–1.0)
Monocytes Relative: 7 %
Neutro Abs: 4.2 10*3/uL (ref 1.7–7.7)
Neutrophils Relative %: 63 %
Platelets: 317 10*3/uL (ref 150–400)
RBC: 3.7 MIL/uL — ABNORMAL LOW (ref 3.87–5.11)
RDW: 13.3 % (ref 11.5–15.5)
WBC: 6.7 10*3/uL (ref 4.0–10.5)
nRBC: 0 % (ref 0.0–0.2)

## 2024-02-01 LAB — POCT URINALYSIS DIPSTICK
Bilirubin, UA: NEGATIVE
Blood, UA: NEGATIVE
Glucose, UA: NEGATIVE
Ketones, UA: NEGATIVE
Leukocytes, UA: NEGATIVE
Nitrite, UA: NEGATIVE
Odor: NORMAL
Protein, UA: NEGATIVE
Spec Grav, UA: 1.02 (ref 1.010–1.025)
Urobilinogen, UA: 0.2 U/dL
pH, UA: 5.5 (ref 5.0–8.0)

## 2024-02-01 LAB — GLUCOSE, POCT (MANUAL RESULT ENTRY): POC Glucose: 102 mg/dL — AB (ref 70–99)

## 2024-02-01 NOTE — ED Provider Notes (Signed)
 [facility] Provider Note    Event Date/Time   First MD Initiated Contact with Patient 02/01/24 1213     (approximate)   History   Altered Mental Status   HPI  Katherine Price is a 78 y.o. female  who presents to the emergency department today from primary care office because of concerns for confusion.  The patient herself has no complaints.  She states she feels like she is in her normal state of health.  However apparently he was concerned by caregiver that she has been more confused over the past couple days.  Additionally apparently there was concern that the patient might have a UTI.  Patient however denies any dysuria or bad odor to her urine.    Physical Exam   Triage Vital Signs: ED Triage Vitals  Encounter Vitals Group     BP 02/01/24 1200 131/72     Systolic BP Percentile --      Diastolic BP Percentile --      Pulse Rate 02/01/24 1200 66     Resp 02/01/24 1200 15     Temp 02/01/24 1200 98.1 F (36.7 C)     Temp Source 02/01/24 1200 Oral     SpO2 02/01/24 1200 95 %     Weight --      Height --      Head Circumference --      Peak Flow --      Pain Score 02/01/24 1154 0     Pain Loc --      Pain Education --      Exclude from Growth Chart --     Most recent vital signs: Vitals:   02/01/24 1200  BP: 131/72  Pulse: 66  Resp: 15  Temp: 98.1 F (36.7 C)  SpO2: 95%   General: Awake, alert, oriented. CV:  Good peripheral perfusion. Regular rate and rhythm. Resp:  Normal effort. Lungs clear. Abd:  No distention. Non tender.   ED Results / Procedures / Treatments   Labs (all labs ordered are listed, but only abnormal results are displayed) Labs Reviewed  CBC WITH DIFFERENTIAL/PLATELET - Abnormal; Notable for the following components:      Result Value   RBC 3.70 (*)    Hemoglobin 11.5 (*)    HCT 33.9 (*)    All other components within normal limits  BASIC METABOLIC PANEL  URINALYSIS, ROUTINE W REFLEX MICROSCOPIC      EKG  I, Phineas Semen, attending physician, personally viewed and interpreted this EKG  EKG Time: 1204 Rate: 76 Rhythm: sinus rhythm with pac Axis: normal Intervals: qtc 459 QRS: narrow ST changes: no st elevation Impression: abnormal ekg   RADIOLOGY I independently interpreted and visualized the CT head. My interpretation: No ICH Radiology interpretation:  IMPRESSION:  1. No acute CT finding. 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  2. Subtotal opacification of the sphenoid sinus consistent with  rhinosinusitis. Previous maxillary sinus FESS.     PROCEDURES:  Critical Care performed: No    MEDICATIONS ORDERED IN ED: Medications - No data to display   IMPRESSION / MDM / ASSESSMENT AND PLAN / ED COURSE  I reviewed the triage vital signs and the nursing notes.                              Differential diagnosis includes, but is not limited to, infection, intracranial  process, electrolyte abnormality  Patient's presentation is most consistent with acute presentation with potential threat to life or bodily function.   Patient presented to the emerged to department today because of concerns for confusion although patient is awake and oriented here in the emergency department.  Did check basic blood work which did not show any concerning findings.  I did review urine obtained at primary care earlier today and that did not show signs concerning for infection.  Head CT without any concerning abnormalities.  At this time patient appears to be essentially at her baseline.  She denies any complaints.  I think it be reasonable for patient be discharged.   FINAL CLINICAL IMPRESSION(S) / ED DIAGNOSES   Final diagnoses:  Confusion    Note:  This document was prepared using Dragon voice recognition software and may include unintentional dictation errors.    Phineas Semen, MD 02/01/24 [phone]

## 2024-02-01 NOTE — ED Triage Notes (Signed)
 Pt to ED via dr. office with confusion since this morning. Currently is pt is A&O x4 on arrival.At baseline, patient goes between A&Ox3 and A&Ox4, she went to dr this morning for stroke ruleout due to hx. Pt has no complaints at this time.    Caregiver [phone] Gilman Buttner

## 2024-02-01 NOTE — ED Notes (Signed)
 This nurse was notified that the patients ride has arrived. This nurse arrived to room 17 and the patient is not present, bp cuff and monitor had been removed and appearing pt departed. Patient was dispo'd out believed to have departed without notifying staff.  Later, this nurse notified by first nurse Amber that the patients caregiver is still awaiting pts arrival to the waiting room. The room was checked and the patient did not arrive back to the room and unknown patients location. Amber notified of the same and attempts began to locate the patient. No LG on file. Charge notified of the same.

## 2024-02-17 ENCOUNTER — Other Ambulatory Visit: Payer: Self-pay | Admitting: Nurse Practitioner

## 2024-02-17 DIAGNOSIS — I10 Essential (primary) hypertension: Secondary | ICD-10-CM

## 2024-02-17 MED ORDER — ENALAPRIL MALEATE 20 MG PO TABS
20.0000 mg | ORAL_TABLET | Freq: Every day | ORAL | 0 refills | Status: DC
Start: 2024-02-17 — End: 2024-06-22

## 2024-02-17 MED ORDER — ATORVASTATIN CALCIUM 20 MG PO TABS: 20.0000 mg | ORAL_TABLET | Freq: Every day | ORAL | 1 refills | Status: DC

## 2024-02-17 NOTE — Telephone Encounter (Signed)
 Copied from CRM [id number]. Topic: General - Other >> Feb 17, 2024 12:41 PM Emylou G wrote: Reason for CRM: Katherine Price w/[facility] called would like to go over medication refill for atorvastin and enalapril.. she couldn't verify address, number, or pharmacy?  but said this was prescribed to her.. pls call her at [phone]

## 2024-02-17 NOTE — Addendum Note (Signed)
 Addended by: Davene Costain on: 02/17/2024 01:47 PM   Modules accepted: Orders

## 2024-05-17 ENCOUNTER — Other Ambulatory Visit: Payer: Self-pay | Admitting: Nurse Practitioner

## 2024-05-17 DIAGNOSIS — R451 Restlessness and agitation: Secondary | ICD-10-CM

## 2024-05-19 NOTE — Telephone Encounter (Signed)
 Requested medications are due for refill today.  yes  Requested medications are on the active medications list.  yes  Last refill. 01/20/2024 #30 3 rf  Future visit scheduled.   no  Notes to clinic.  Refill not delegated.    Requested Prescriptions  Pending Prescriptions Disp Refills   QUEtiapine  (SEROQUEL ) 25 MG tablet [Pharmacy Med Name: QUETIAPINE  FUMARATE 25 MG TAB] 30 tablet 3    Sig: TAKE 1 TABLET BY MOUTH AT BEDTIME     Not Delegated - Psychiatry:  Antipsychotics - Second Generation (Atypical) - quetiapine  Failed - 05/19/2024  2:31 PM      Failed - This refill cannot be delegated      Failed - TSH in normal range and within 360 days    No results found for: TSH, POCTSH, TSHREFLEX       Failed - Valid encounter within last 6 months    Recent Outpatient Visits           3 months ago Confusion   [facility] Donny Gall F, FNP              Failed - Lipid Panel in normal range within the last 12 months    Cholesterol  Date Value Ref Range Status  07/11/2023 141 0 - 200 mg/dL Final   LDL Cholesterol (Calc)  Date Value Ref Range Status  04/15/2023 200 (H) mg/dL (calc) Final    Comment:    LDL-C levels > or = 190 mg/dL may indicate familial  hypercholesterolemia (FH). Clinical assessment and  measurement of blood lipid levels should be  considered for all first degree relatives of patients with an FH diagnosis. LDL Cholesterol (LDL-C) levels > or = 300 mg/dL may indicate homozygous familial hypercholesterolemia (HoFH). Untreated,  these extremely high LDL-C levels can result in premature CV events and mortality. Patients should be identified early and provided appropriate interventions to reduce the cumulative LDL-C burden from birth. . For questions about testing for familial hypercholesterolemia, please call Engineer, materials at [email or web address]. Norberto Bear, et al. J National Lipid Association   Recommendations for Patient-Centered Management of Dyslipidemia: Part 1 Journal of  Clinical Lipidology 2015;9(2), 129-169. Cuchel, M. et al. (2014). Homozygous familial hypercholesterolaemia: new insights and guidance for clinicians to improve detection and clinical management. European Heart Journal, 35(32), [phone]. Reference range: <100 . Desirable range <100 mg/dL for primary prevention;   <70 mg/dL for patients with CHD or diabetic patients  with > or = 2 CHD risk factors. Aaron Aas LDL-C is now calculated using the Martin-Hopkins  calculation, which is a validated novel method providing  better accuracy than the Friedewald equation in the  estimation of LDL-C.  Melinda Sprawls et al. Erroll Heard. 4696;295(28): 2061-2068  (http://education.[facility].com/faq/FAQ164)    LDL Cholesterol  Date Value Ref Range Status  07/11/2023 76 0 - 99 mg/dL Final    Comment:           Total Cholesterol/HDL:CHD Risk Coronary Heart Disease Risk Table                     Men   Women  1/2 Average Risk   3.4   3.3  Average Risk       5.0   4.4  2 X Average Risk   9.6   7.1  3 X Average Risk  23.4   11.0        Use the calculated Patient Ratio above and the CHD  Risk Table to determine the patient's CHD Risk.        ATP III CLASSIFICATION (LDL):  <100     mg/dL   Optimal  469-629  mg/dL   Near or Above                    Optimal  130-159  mg/dL   Borderline  528-413  mg/dL   High  >244     mg/dL   Very High Performed at [facility], [address] [place], [place] [address]    HDL  Date Value Ref Range Status  07/11/2023 55 >40 mg/dL Final   Triglycerides  Date Value Ref Range Status  07/11/2023 48 <150 mg/dL Final         Passed - Completed PHQ-2 or PHQ-9 in the last 360 days      Passed - Last BP in normal range    BP Readings from Last 1 Encounters:  02/01/24 131/72         Passed - Last Heart Rate in normal range    Pulse Readings from Last 1 Encounters:   02/01/24 66         Passed - CBC within normal limits and completed in the last 12 months    WBC  Date Value Ref Range Status  02/01/2024 6.7 4.0 - 10.5 K/uL Final   RBC  Date Value Ref Range Status  02/01/2024 3.70 (L) 3.87 - 5.11 MIL/uL Final   Hemoglobin  Date Value Ref Range Status  02/01/2024 11.5 (L) 12.0 - 15.0 g/dL Final   HCT  Date Value Ref Range Status  02/01/2024 33.9 (L) 36.0 - 46.0 % Final   MCHC  Date Value Ref Range Status  02/01/2024 33.9 30.0 - 36.0 g/dL Final   [facility]  Date Value Ref Range Status  02/01/2024 31.1 26.0 - 34.0 pg Final   MCV  Date Value Ref Range Status  02/01/2024 91.6 80.0 - 100.0 fL Final   No results found for: PLTCOUNTKUC, LABPLAT, POCPLA RDW  Date Value Ref Range Status  02/01/2024 13.3 11.5 - 15.5 % Final         Passed - CMP within normal limits and completed in the last 12 months    Albumin  Date Value Ref Range Status  07/10/2023 3.6 3.5 - 5.0 g/dL Final   Alkaline Phosphatase  Date Value Ref Range Status  07/10/2023 63 38 - 126 U/L Final   Alkaline phosphatase (APISO)  Date Value Ref Range Status  04/15/2023 85 37 - 153 U/L Final   ALT  Date Value Ref Range Status  07/10/2023 17 0 - 44 U/L Final   AST  Date Value Ref Range Status  07/10/2023 26 15 - 41 U/L Final   BUN  Date Value Ref Range Status  02/01/2024 22 8 - 23 mg/dL Final   Calcium   Date Value Ref Range Status  02/01/2024 9.4 8.9 - 10.3 mg/dL Final   CO2  Date Value Ref Range Status  02/01/2024 23 22 - 32 mmol/L Final   Creat  Date Value Ref Range Status  04/15/2023 1.05 (H) 0.60 - 1.00 mg/dL Final   Creatinine, Ser  Date Value Ref Range Status  02/01/2024 0.95 0.44 - 1.00 mg/dL Final   Glucose, Bld  Date Value Ref Range Status  02/01/2024 81 70 - 99 mg/dL Final    Comment:    Glucose reference range applies only to samples taken after fasting for at  least 8 hours.   POC Glucose  Date Value Ref Range Status  02/01/2024 102  (A) 70 - 99 mg/dl Final   Potassium  Date Value Ref Range Status  02/01/2024 3.6 3.5 - 5.1 mmol/L Final   Sodium  Date Value Ref Range Status  02/01/2024 140 135 - 145 mmol/L Final   Total Bilirubin  Date Value Ref Range Status  07/10/2023 0.5 0.3 - 1.2 mg/dL Final   Protein, ur  Date Value Ref Range Status  07/11/2023 NEGATIVE NEGATIVE mg/dL Final   Protein, UA  Date Value Ref Range Status  02/01/2024 Negative Negative Final   Total Protein  Date Value Ref Range Status  07/10/2023 6.2 (L) 6.5 - 8.1 g/dL Final   GFR calc Af Amer  Date Value Ref Range Status  08/06/2019 >60 >60 mL/min Final   eGFR  Date Value Ref Range Status  04/15/2023 54 (L) > OR = 60 mL/min/1.67m2 Final   GFR, Estimated  Date Value Ref Range Status  02/01/2024 >60 >60 mL/min Final    Comment:    (NOTE) Calculated using the CKD-EPI Creatinine Equation (2021)

## 2024-06-20 ENCOUNTER — Other Ambulatory Visit: Payer: Self-pay | Admitting: Nurse Practitioner

## 2024-06-20 DIAGNOSIS — I10 Essential (primary) hypertension: Secondary | ICD-10-CM

## 2024-06-22 NOTE — Telephone Encounter (Signed)
 Requested medication (s) are due for refill today: yes  Requested medication (s) are on the active medication list: yes  Last refill:  enalapril : 02/17/24 #90                 aspirin : 12/31/23 #90 1 RF  Future visit scheduled: no  Notes to clinic:  called pt and LM on to call office to schedule appt    Requested Prescriptions  Pending Prescriptions Disp Refills   enalapril  (VASOTEC ) 20 MG tablet [Pharmacy Med Name: ENALAPRIL  MALEATE 20 MG TAB] 90 tablet 0    Sig: TAKE 1 TABLET BY MOUTH ONCE DAILY     Cardiovascular:  ACE Inhibitors Failed - 06/22/2024  1:43 PM      Failed - Valid encounter within last 6 months    Recent Outpatient Visits           4 months ago Confusion   [facility] Gareth Mliss FALCON, FNP              Passed - Cr in normal range and within 180 days    Creat  Date Value Ref Range Status  04/15/2023 1.05 (H) 0.60 - 1.00 mg/dL Final   Creatinine, Ser  Date Value Ref Range Status  02/01/2024 0.95 0.44 - 1.00 mg/dL Final         Passed - K in normal range and within 180 days    Potassium  Date Value Ref Range Status  02/01/2024 3.6 3.5 - 5.1 mmol/L Final         Passed - Patient is not pregnant      Passed - Last BP in normal range    BP Readings from Last 1 Encounters:  02/01/24 131/72          ASPIRIN  LOW DOSE 81 MG tablet [Pharmacy Med Name: ASPIRIN  LOW DOSE 81 MG DR TAB] 90 tablet 1    Sig: TAKE 1 TABLET BY MOUTH ONCE DAILY SWALLOW WHOLE     Analgesics:  NSAIDS - aspirin  Failed - 06/22/2024  1:43 PM      Failed - Valid encounter within last 12 months    Recent Outpatient Visits           4 months ago Confusion   [facility] Gareth Mliss FALCON, FNP              Passed - Cr in normal range and within 360 days    Creat  Date Value Ref Range Status  04/15/2023 1.05 (H) 0.60 - 1.00 mg/dL Final   Creatinine, Ser  Date Value Ref Range Status  02/01/2024 0.95 0.44 - 1.00 mg/dL Final          Passed - eGFR is 10 or above and within 360 days    GFR calc Af Amer  Date Value Ref Range Status  08/06/2019 >60 >60 mL/min Final   GFR, Estimated  Date Value Ref Range Status  02/01/2024 >60 >60 mL/min Final    Comment:    (NOTE) Calculated using the CKD-EPI Creatinine Equation (2021)    eGFR  Date Value Ref Range Status  04/15/2023 54 (L) > OR = 60 mL/min/1.43m2 Final         Passed - Patient is not pregnant

## 2024-07-05 ENCOUNTER — Ambulatory Visit: Payer: Self-pay

## 2024-07-05 ENCOUNTER — Ambulatory Visit: Admitting: Internal Medicine

## 2024-07-05 NOTE — Telephone Encounter (Signed)
 FYI Only or Action Required?: Action required by provider: request for appointment.  Patient was last seen in primary care on 02/01/2024 by Katherine Mliss FALCON, FNP.  Called Nurse Triage reporting Abdominal Pain.  Symptoms began several days ago.  Interventions attempted: Nothing.  Symptoms are: gradually worsening. Pain 8/10. Has had recent bowel movement. No nausea or vomiting.  Triage Disposition: See HCP Within 4 Hours (Or PCP Triage)  Patient/caregiver understands and will follow disposition?: Yes   Copied from CRM #[id number]. Topic: Clinical - Red Word Triage >> Jul 05, 2024 10:33 AM Emylou G wrote: [facility] that prompted transfer to Nurse Triage: right side pain for a few days - pain level 8/10 Answer Assessment - Initial Assessment Questions 1. LOCATION: Where does it hurt?      Right lower side  2. RADIATION: Does the pain shoot anywhere else? (e.g., chest, back)     no 3. ONSET: When did the pain begin? (e.g., minutes, hours or days ago)      2 days 4. SUDDEN: Gradual or sudden onset?     sudden 5. PATTERN Does the pain come and go, or is it constant?     N/a 6. SEVERITY: How bad is the pain?  (e.g., Scale 1-10; mild, moderate, or severe)     8 7. RECURRENT SYMPTOM: Have you ever had this type of stomach pain before? If Yes, ask: When was the last time? and What happened that time?      yes 8. CAUSE: What do you think is causing the stomach pain? (e.g., gallstones, recent abdominal surgery)     unsure 9. RELIEVING/AGGRAVATING FACTORS: What makes it better or worse? (e.g., antacids, bending or twisting motion, bowel movement)     no 10. OTHER SYMPTOMS: Do you have any other symptoms? (e.g., back pain, diarrhea, fever, urination pain, vomiting)       no 11. PREGNANCY: Is there any chance you are pregnant? When was your last menstrual period?       no  Protocols used: Abdominal Pain - Female-A-AH  Reason for Disposition  [1] MILD-MODERATE pain  AND [2] constant AND [3] present > 2 hours  Answer Assessment - Initial Assessment Questions 1. LOCATION: Where does it hurt?      Right lower side  2. RADIATION: Does the pain shoot anywhere else? (e.g., chest, back)     no 3. ONSET: When did the pain begin? (e.g., minutes, hours or days ago)      2 days 4. SUDDEN: Gradual or sudden onset?     sudden 5. PATTERN Does the pain come and go, or is it constant?     N/a 6. SEVERITY: How bad is the pain?  (e.g., Scale 1-10; mild, moderate, or severe)     8 7. RECURRENT SYMPTOM: Have you ever had this type of stomach pain before? If Yes, ask: When was the last time? and What happened that time?      yes 8. CAUSE: What do you think is causing the stomach pain? (e.g., gallstones, recent abdominal surgery)     unsure 9. RELIEVING/AGGRAVATING FACTORS: What makes it better or worse? (e.g., antacids, bending or twisting motion, bowel movement)     no 10. OTHER SYMPTOMS: Do you have any other symptoms? (e.g., back pain, diarrhea, fever, urination pain, vomiting)       no 11. PREGNANCY: Is there any chance you are pregnant? When was your last menstrual period?       no  Protocols used:  Abdominal Pain - Female-A-AH

## 2024-07-13 ENCOUNTER — Other Ambulatory Visit: Payer: Self-pay | Admitting: Nurse Practitioner

## 2024-07-13 DIAGNOSIS — I10 Essential (primary) hypertension: Secondary | ICD-10-CM

## 2024-07-13 NOTE — Telephone Encounter (Unsigned)
 Copied from CRM [id number]. Topic: Clinical - Medication Refill >> Jul 13, 2024  9:22 AM Donna E wrote: Medication:  atorvastatin  (LIPITOR) 20 MG tablet enalapril  (VASOTEC ) 20 MG tablet  Has the patient contacted their pharmacy? No [employer] calling in to request refill    This is the patient's preferred pharmacy:   [facility] - [place], [place] - [address] [place] [address] Phone: [phone] Fax: [phone]  Is this the correct pharmacy for this prescription? Yes If no, delete pharmacy and type the correct one.   Has the prescription been filled recently? Yes  Is the patient out of the medication? Yes  Has the patient been seen for an appointment in the last year OR does the patient have an upcoming appointment? Yes  Can we respond through MyChart? No  Agent: Please be advised that Rx refills may take up to 3 business days. We ask that you follow-up with your pharmacy.

## 2024-07-17 ENCOUNTER — Other Ambulatory Visit: Payer: Self-pay | Admitting: Nurse Practitioner

## 2024-07-17 DIAGNOSIS — I10 Essential (primary) hypertension: Secondary | ICD-10-CM

## 2024-07-17 NOTE — Telephone Encounter (Signed)
 Requested medications are due for refill today.  yes  Requested medications are on the active medications list.  yes  Last refill. Varied   Future visit scheduled.   no  Notes to clinic.  Pt last seen 05/31/2023, labs are expired.    Requested Prescriptions  Pending Prescriptions Disp Refills   atorvastatin  (LIPITOR) 20 MG tablet 90 tablet 1    Sig: Take 1 tablet (20 mg total) by mouth daily.     Cardiovascular:  Antilipid - Statins Failed - 07/17/2024 12:27 PM      Failed - Valid encounter within last 12 months    Recent Outpatient Visits           5 months ago Confusion   [facility] Gareth Clarity F, FNP              Failed - Lipid Panel in normal range within the last 12 months    Cholesterol  Date Value Ref Range Status  07/11/2023 141 0 - 200 mg/dL Final   LDL Cholesterol (Calc)  Date Value Ref Range Status  04/15/2023 200 (H) mg/dL (calc) Final    Comment:    LDL-C levels > or = 190 mg/dL may indicate familial  hypercholesterolemia (FH). Clinical assessment and  measurement of blood lipid levels should be  considered for all first degree relatives of patients with an FH diagnosis. LDL Cholesterol (LDL-C) levels > or = 300 mg/dL may indicate homozygous familial hypercholesterolemia (HoFH). Untreated,  these extremely high LDL-C levels can result in premature CV events and mortality. Patients should be identified early and provided appropriate interventions to reduce the cumulative LDL-C burden from birth. . For questions about testing for familial hypercholesterolemia, please call Engineer, materials at 1.866.GENE.INFO. SABRA Veatrice DASEN, et al. J National Lipid Association  Recommendations for Patient-Centered Management of Dyslipidemia: Part 1 Journal of  Clinical Lipidology 2015;9(2), 129-169. Cuchel, M. et al. (2014). Homozygous familial hypercholesterolaemia: new insights and guidance for clinicians to improve  detection and clinical management. European Heart Journal, 35(32), [phone]. Reference range: <100 . Desirable range <100 mg/dL for primary prevention;   <70 mg/dL for patients with CHD or diabetic patients  with > or = 2 CHD risk factors. SABRA LDL-C is now calculated using the Martin-Hopkins  calculation, which is a validated novel method providing  better accuracy than the Friedewald equation in the  estimation of LDL-C.  Gladis APPLETHWAITE et al. SANDREA. 7986;689(80): 2061-2068  (http://education.QuestDiagnostics.com/faq/FAQ164)    LDL Cholesterol  Date Value Ref Range Status  07/11/2023 76 0 - 99 mg/dL Final    Comment:           Total Cholesterol/HDL:CHD Risk Coronary Heart Disease Risk Table                     Men   Women  1/2 Average Risk   3.4   3.3  Average Risk       5.0   4.4  2 X Average Risk   9.6   7.1  3 X Average Risk  23.4   11.0        Use the calculated Patient Ratio above and the CHD Risk Table to determine the patient's CHD Risk.        ATP III CLASSIFICATION (LDL):  <100     mg/dL   Optimal  899-870  mg/dL   Near or Above  Optimal  130-159  mg/dL   Borderline  839-810  mg/dL   High  >809     mg/dL   Very High Performed at [facility], [address] [place], [place] [address]    HDL  Date Value Ref Range Status  07/11/2023 55 >40 mg/dL Final   Triglycerides  Date Value Ref Range Status  07/11/2023 48 <150 mg/dL Final         Passed - Patient is not pregnant       enalapril  (VASOTEC ) 20 MG tablet 30 tablet 0    Sig: Take 1 tablet (20 mg total) by mouth daily.     Cardiovascular:  ACE Inhibitors Failed - 07/17/2024 12:27 PM      Failed - Valid encounter within last 6 months    Recent Outpatient Visits           5 months ago Confusion   [facility] Gareth Mliss [place]AL[place], [place]              Passed - Cr in normal range and within 180 days    Creat  Date Value Ref Range Status  04/15/2023  1.05 (H) 0.60 - 1.00 mg/dL Final   Creatinine, Ser  Date Value Ref Range Status  02/01/2024 0.95 0.44 - 1.00 mg/dL Final         Passed - K in normal range and within 180 days    Potassium  Date Value Ref Range Status  02/01/2024 3.6 3.5 - 5.1 mmol/L Final         Passed - Patient is not pregnant      Passed - Last BP in normal range    BP Readings from Last 1 Encounters:  02/01/24 131/72

## 2024-07-19 NOTE — Telephone Encounter (Signed)
 Pt needs appointment. Called number in chart and spoke with Marval Batter, pt's daughter. Was advised to call [phone] and ask for [facility]. Called that number and call was answered but immediately hangs up. Called one more time and same issue. Called daughter Marval back and told her about the disconnects and advised her mom needs an appt.

## 2024-07-19 NOTE — Telephone Encounter (Signed)
 Requested medication (s) are due for refill today: yes  Requested medication (s) are on the active medication list: yes  Last refill:  aspirin : 06/22/24 #30                   enalapril : 06/22/24 #30  Future visit scheduled: no  Notes to clinic:  notation on both prescriptions that needs a doctors note. Attempted to contact pt via [facility] her caregiver. Call was answered and hung twice. Contacted pt's daughter Marval Batter and advised her wasn't able to discuss with Ronal due to calls hanging up. Advised Debbie to contact them to make an appt.    Requested Prescriptions  Pending Prescriptions Disp Refills   enalapril  (VASOTEC ) 20 MG tablet [Pharmacy Med Name: ENALAPRIL  MALEATE 20 MG TAB] 30 tablet 0    Sig: TAKE 1 TABLET BY MOUTH ONCE DAILY     Cardiovascular:  ACE Inhibitors Failed - 07/19/2024  1:55 PM      Failed - Valid encounter within last 6 months    Recent Outpatient Visits           5 months ago Confusion   [facility] Gareth Mliss FALCON, FNP              Passed - Cr in normal range and within 180 days    Creat  Date Value Ref Range Status  04/15/2023 1.05 (H) 0.60 - 1.00 mg/dL Final   Creatinine, Ser  Date Value Ref Range Status  02/01/2024 0.95 0.44 - 1.00 mg/dL Final         Passed - K in normal range and within 180 days    Potassium  Date Value Ref Range Status  02/01/2024 3.6 3.5 - 5.1 mmol/L Final         Passed - Patient is not pregnant      Passed - Last BP in normal range    BP Readings from Last 1 Encounters:  02/01/24 131/72          ASPIRIN  LOW DOSE 81 MG tablet [Pharmacy Med Name: ASPIRIN  LOW DOSE 81 MG DR TAB] 30 tablet 0    Sig: TAKE 1 TABLET BY MOUTH ONCE DAILY SWALLOW WHOLE     Analgesics:  NSAIDS - aspirin  Failed - 07/19/2024  1:55 PM      Failed - Valid encounter within last 12 months    Recent Outpatient Visits           5 months ago Confusion   [facility] Gareth Mliss FALCON, FNP               Passed - Cr in normal range and within 360 days    Creat  Date Value Ref Range Status  04/15/2023 1.05 (H) 0.60 - 1.00 mg/dL Final   Creatinine, Ser  Date Value Ref Range Status  02/01/2024 0.95 0.44 - 1.00 mg/dL Final         Passed - eGFR is 10 or above and within 360 days    GFR calc Af Amer  Date Value Ref Range Status  08/06/2019 >60 >60 mL/min Final   GFR, Estimated  Date Value Ref Range Status  02/01/2024 >60 >60 mL/min Final    Comment:    (NOTE) Calculated using the CKD-EPI Creatinine Equation (2021)    eGFR  Date Value Ref Range Status  04/15/2023 54 (L) > OR = 60 mL/min/1.40m2 Final         Passed -  Patient is not pregnant

## 2024-08-23 ENCOUNTER — Ambulatory Visit: Admitting: Nurse Practitioner

## 2024-08-23 ENCOUNTER — Encounter: Payer: Self-pay | Admitting: Nurse Practitioner

## 2024-08-23 VITALS — BP 122/62 | HR 79 | Temp 98.0°F | Resp 16 | Ht 63.0 in | Wt 86.0 lb

## 2024-08-23 DIAGNOSIS — I10 Essential (primary) hypertension: Secondary | ICD-10-CM | POA: Diagnosis not present

## 2024-08-23 DIAGNOSIS — J449 Chronic obstructive pulmonary disease, unspecified: Secondary | ICD-10-CM

## 2024-08-23 DIAGNOSIS — R451 Restlessness and agitation: Secondary | ICD-10-CM

## 2024-08-23 DIAGNOSIS — R7303 Prediabetes: Secondary | ICD-10-CM

## 2024-08-23 DIAGNOSIS — E782 Mixed hyperlipidemia: Secondary | ICD-10-CM | POA: Diagnosis not present

## 2024-08-23 DIAGNOSIS — I2510 Atherosclerotic heart disease of native coronary artery without angina pectoris: Secondary | ICD-10-CM

## 2024-08-23 DIAGNOSIS — R8290 Unspecified abnormal findings in urine: Secondary | ICD-10-CM

## 2024-08-23 DIAGNOSIS — F419 Anxiety disorder, unspecified: Secondary | ICD-10-CM | POA: Diagnosis not present

## 2024-08-23 DIAGNOSIS — N1831 Chronic kidney disease, stage 3a: Secondary | ICD-10-CM

## 2024-08-23 DIAGNOSIS — I252 Old myocardial infarction: Secondary | ICD-10-CM

## 2024-08-23 DIAGNOSIS — F32A Depression, unspecified: Secondary | ICD-10-CM

## 2024-08-23 DIAGNOSIS — M159 Polyosteoarthritis, unspecified: Secondary | ICD-10-CM

## 2024-08-23 DIAGNOSIS — Z23 Encounter for immunization: Secondary | ICD-10-CM | POA: Diagnosis not present

## 2024-08-23 DIAGNOSIS — Z8673 Personal history of transient ischemic attack (TIA), and cerebral infarction without residual deficits: Secondary | ICD-10-CM

## 2024-08-23 LAB — POCT URINALYSIS DIPSTICK
Bilirubin, UA: NEGATIVE
Blood, UA: NEGATIVE
Glucose, UA: NEGATIVE
Ketones, UA: NEGATIVE
Nitrite, UA: POSITIVE
Protein, UA: NEGATIVE
Spec Grav, UA: 1.025 (ref 1.010–1.025)
Urobilinogen, UA: 0.2 U/dL
pH, UA: 6.5 (ref 5.0–8.0)

## 2024-08-23 MED ORDER — CEPHALEXIN 500 MG PO CAPS: 500.0000 mg | ORAL_CAPSULE | Freq: Two times a day (BID) | ORAL | 0 refills | Status: AC

## 2024-08-23 MED ORDER — ASPIRIN 81 MG PO TBEC: 81.0000 mg | DELAYED_RELEASE_TABLET | Freq: Every day | ORAL | 1 refills | Status: AC

## 2024-08-23 MED ORDER — QUETIAPINE FUMARATE 25 MG PO TABS
25.0000 mg | ORAL_TABLET | Freq: Two times a day (BID) | ORAL | 1 refills | Status: AC
Start: 2024-08-23 — End: ?

## 2024-08-23 MED ORDER — ENALAPRIL MALEATE 20 MG PO TABS
20.0000 mg | ORAL_TABLET | Freq: Every day | ORAL | 1 refills | Status: AC
Start: 2024-08-23 — End: ?

## 2024-08-23 MED ORDER — ATORVASTATIN CALCIUM 20 MG PO TABS: 20.0000 mg | ORAL_TABLET | Freq: Every day | ORAL | 1 refills | Status: AC

## 2024-08-23 NOTE — Progress Notes (Signed)
 BP 122/62   Pulse 79   Temp 98 F (36.7 C)   Resp 16   Ht 5' 3 (1.6 m)   Wt 86 lb (39 kg)   SpO2 99%   BMI 15.23 kg/m    Subjective:    Patient ID: Katherine Price, female    DOB: 08-04-1946, 78 y.o.   MRN: [id number]  HPI: Katherine Price is a 78 y.o. female  Chief Complaint  Patient presents with   Medical Management of Chronic Issues   Constipation   Urine Output    Foul dark urine   Discussed the use of AI scribe software for clinical note transcription with the patient, who gave verbal consent to proceed.  History of Present Illness Katherine Price is an 78 year old female who presents for a routine follow-up.  Gastrointestinal and genitourinary symptoms - Constipation present for the past three weeks - Dark, foul-smelling urine for the past three weeks - No dysuria, increased urinary frequency, fever, or new back pain - Inconsistent increase in water intake despite encouragement  Neuropsychiatric symptoms - Episodes of agitation characterized by clenching fists when asked to do something, reported by health care aid  Cardiovascular and metabolic status - History of coronary artery disease, hypertension, stroke, myocardial infarction, hyperlipidemia, chronic kidney disease, COPD, anxiety, depression, and prediabetes - Current medications: aspirin  81 mg daily, atorvastatin  20 mg daily, enalapril  20 mg daily, Seroquel  25 mg at bedtime - Last lipid panel: LDL 76 - A1c: 6.0 - Metabolic panel and GFR within normal limits - Blood pressure: 122/62         08/23/2024   11:38 AM 05/31/2023   10:13 AM 04/15/2023    2:04 PM  Depression screen PHQ 2/9  Decreased Interest 0 0 0  Down, Depressed, Hopeless 0 0 0  PHQ - 2 Score 0 0 0  Altered sleeping 0    Tired, decreased energy 0    Change in appetite 0    Feeling bad or failure about yourself  0    Trouble concentrating 0    Moving slowly or fidgety/restless 0    Suicidal thoughts 0    PHQ-9 Score 0     Difficult doing work/chores Not difficult at all  Not difficult at all       04/15/2023    1:17 PM 12/25/2021    1:16 PM  GAD 7 : Generalized Anxiety Score  Nervous, Anxious, on Edge 0 0  Control/stop worrying 0 0  Worry too much - different things 0 0  Trouble relaxing 0 0  Restless 0 0  Easily annoyed or irritable 0 0  Afraid - awful might happen 0 0  Total GAD 7 Score 0 0  Anxiety Difficulty Not difficult at all Not difficult at all     Relevant past medical, surgical, family and social history reviewed and updated as indicated. Interim medical history since our last visit reviewed. Allergies and medications reviewed and updated.  Review of Systems  Constitutional: Negative for fever or weight change.  Respiratory: Negative for cough and shortness of breath.   Cardiovascular: Negative for chest pain or palpitations.  Gastrointestinal: Negative for abdominal pain, constipation GU: foul smelling urine, urinary frequency Musculoskeletal: Negative for gait problem or joint swelling.  Skin: Negative for rash.  Neurological: Negative for dizziness or headache.  No other specific complaints in a complete review of systems (except as listed in HPI above).      Objective:  BP 122/62   Pulse 79   Temp 98 F (36.7 C)   Resp 16   Ht 5' 3 (1.6 m)   Wt 86 lb (39 kg)   SpO2 99%   BMI 15.23 kg/m    Wt Readings from Last 3 Encounters:  08/23/24 86 lb (39 kg)  02/01/24 90 lb 8 oz (41.1 kg)  11/19/23 89 lb 12.8 oz (40.7 kg)    Physical Exam VITALS: BP- 122/62 GENERAL: Alert, cooperative, well developed, no acute distress. HEENT: Normocephalic, normal oropharynx, moist mucous membranes. CHEST: Clear to auscultation bilaterally, no wheezes, rhonchi, or crackles. CARDIOVASCULAR: Normal heart rate and rhythm, S1 and S2 normal without murmurs. ABDOMEN: Soft, non-tender, non-distended, without organomegaly, normal bowel sounds. No costovertebral angle  tenderness. EXTREMITIES: No cyanosis or edema. NEUROLOGICAL: Cranial nerves grossly intact, moves all extremities without gross motor or sensory deficit.  Results for orders placed or performed in visit on 08/23/24  POCT Urinalysis Dipstick   Collection Time: 08/23/24  9:41 AM  Result Value Ref Range   Color, UA yellow    Clarity, UA clear    Glucose, UA Negative Negative   Bilirubin, UA neg    Ketones, UA neg    Spec Grav, UA 1.025 1.010 - 1.025   Blood, UA neg    pH, UA 6.5 5.0 - 8.0   Protein, UA Negative Negative   Urobilinogen, UA 0.2 0.2 or 1.0 E.U./dL   Nitrite, UA positive    Leukocytes, UA Large (3+) (A) Negative   Appearance clear    Odor foul           Assessment & Plan:   Problem List Items Addressed This Visit       Cardiovascular and Mediastinum   CAD (coronary artery disease) - Primary   Relevant Medications   enalapril  (VASOTEC ) 20 MG tablet   atorvastatin  (LIPITOR) 20 MG tablet   aspirin  EC (ASPIRIN  LOW DOSE) 81 MG tablet   Other Relevant Orders   Lipid panel   Primary hypertension   Relevant Medications   enalapril  (VASOTEC ) 20 MG tablet   atorvastatin  (LIPITOR) 20 MG tablet   aspirin  EC (ASPIRIN  LOW DOSE) 81 MG tablet   Other Relevant Orders   CBC with Differential/Platelet   Comprehensive metabolic panel with GFR     Respiratory   COPD (chronic obstructive pulmonary disease) (HCC)     Musculoskeletal and Integument   Generalized OA   Relevant Medications   aspirin  EC (ASPIRIN  LOW DOSE) 81 MG tablet     Genitourinary   Chronic kidney disease, stage 3a (HCC)   Relevant Orders   Comprehensive metabolic panel with GFR     Other   Agitation   Relevant Medications   QUEtiapine  (SEROQUEL ) 25 MG tablet   History of MI (myocardial infarction)   Relevant Medications   atorvastatin  (LIPITOR) 20 MG tablet   aspirin  EC (ASPIRIN  LOW DOSE) 81 MG tablet   Other Relevant Orders   Lipid panel   Hyperlipidemia   Relevant Medications    enalapril  (VASOTEC ) 20 MG tablet   atorvastatin  (LIPITOR) 20 MG tablet   aspirin  EC (ASPIRIN  LOW DOSE) 81 MG tablet   Other Relevant Orders   Lipid panel   Anxiety and depression   Hx of stroke without residual deficits   Relevant Medications   atorvastatin  (LIPITOR) 20 MG tablet   aspirin  EC (ASPIRIN  LOW DOSE) 81 MG tablet   Other Relevant Orders   Lipid panel   Other Visit Diagnoses  Prediabetes       Relevant Orders   Hemoglobin A1c     Foul smelling urine       Relevant Medications   cephALEXin  (KEFLEX ) 500 MG capsule   Other Relevant Orders   POCT Urinalysis Dipstick (Completed)   Urine Culture     Immunization due       Relevant Orders   Flu vaccine HIGH DOSE PF(Fluzone Trivalent) (Completed)        Assessment and Plan Assessment & Plan Urinary tract infection Indicated by leukocytes and nitrates in urine. Symptoms include dark, foul-smelling urine for the past three weeks. No fever or back pain reported. - Start Keflex (antibiotic) twice daily for five days - Encourage increased fluid intake - Send urine for culture  Constipation -increase fiber and water intake -if no improvement can try miralax  Depression and anxiety with agitation Ongoing agitation and anxiety. Current medication is Seroquel  25 mg at bedtime. - Increase Seroquel  to 25 mg twice daily - Discussed potential for increased daytime sleepiness  Coronary artery disease/HLD/hx of mi/hx of cva Blood pressure is well-controlled at 122/62 mmHg. -continue asa 81 mg daily, enalapril  20 mg daily and atorvastatin  20 mg daily  Hypertension Blood pressure is well-controlled at 122/62 mmHg. -continue enalapril  20 mg daily   Chronic kidney disease Chronic condition. Last metabolic panel and GFR were normal.  Chronic obstructive pulmonary disease (COPD) Lungs sound clear on examination.  Osteoarthritis -no changes, doing well  General Health Maintenance Received flu shot. Discussed  importance of nutrition and hydration. - Encourage regular meals and adequate hydration        Follow up plan: Return in about 6 months (around 02/20/2025) for follow up.

## 2024-08-24 ENCOUNTER — Ambulatory Visit: Payer: Self-pay | Admitting: Nurse Practitioner

## 2024-08-24 LAB — CBC WITH DIFFERENTIAL/PLATELET
Absolute Lymphocytes: 1429 {cells}/uL (ref 850–3900)
Absolute Monocytes: 365 {cells}/uL (ref 200–950)
Basophils Absolute: 129 {cells}/uL (ref 0–200)
Basophils Relative: 1.7 %
Eosinophils Absolute: 160 {cells}/uL (ref 15–500)
Eosinophils Relative: 2.1 %
HCT: 37 % (ref 35.0–45.0)
Hemoglobin: 12.2 g/dL (ref 11.7–15.5)
MCH: 30.3 pg (ref 27.0–33.0)
MCHC: 33 g/dL (ref 32.0–36.0)
MCV: 92 fL (ref 80.0–100.0)
MPV: 11 fL (ref 7.5–12.5)
Monocytes Relative: 4.8 %
Neutro Abs: 5518 {cells}/uL (ref 1500–7800)
Neutrophils Relative %: 72.6 %
Platelets: 474 Thousand/uL — ABNORMAL HIGH (ref 140–400)
RBC: 4.02 Million/uL (ref 3.80–5.10)
RDW: 13.4 % (ref 11.0–15.0)
Total Lymphocyte: 18.8 %
WBC: 7.6 Thousand/uL (ref 3.8–10.8)

## 2024-08-24 LAB — LIPID PANEL
Cholesterol: 154 mg/dL (ref <200)
HDL: 66 mg/dL (ref >=50)
LDL Cholesterol (Calc): 70 mg/dL
Non-HDL Cholesterol (Calc): 88 mg/dL (ref <130)
Total CHOL/HDL Ratio: 2.3 (calc) (ref <5.0)
Triglycerides: 98 mg/dL (ref <150)

## 2024-08-24 LAB — COMPREHENSIVE METABOLIC PANEL WITH GFR
AG Ratio: 1.6 (calc) (ref 1.0–2.5)
ALT: 12 U/L (ref 6–29)
AST: 15 U/L (ref 10–35)
Albumin: 4.3 g/dL (ref 3.6–5.1)
Alkaline phosphatase (APISO): 87 U/L (ref 37–153)
BUN: 15 mg/dL (ref 7–25)
CO2: 26 mmol/L (ref 20–32)
Calcium: 9.9 mg/dL (ref 8.6–10.4)
Chloride: 104 mmol/L (ref 98–110)
Creat: 0.9 mg/dL (ref 0.60–0.95)
Globulin: 2.7 g/dL (ref 1.9–3.7)
Glucose, Bld: 94 mg/dL (ref 65–99)
Potassium: 4.4 mmol/L (ref 3.5–5.3)
Sodium: 141 mmol/L (ref 135–146)
Total Bilirubin: 0.7 mg/dL (ref 0.2–1.2)
Total Protein: 7 g/dL (ref 6.1–8.1)
eGFR: 65 mL/min/1.73m2 (ref >=60)

## 2024-08-24 LAB — HEMOGLOBIN A1C
Hgb A1c MFr Bld: 5.8 % — ABNORMAL HIGH (ref <5.7)
Mean Plasma Glucose: 120 mg/dL
eAG (mmol/L): 6.6 mmol/L

## 2024-08-25 LAB — URINE CULTURE
MICRO NUMBER:: 17016365
SPECIMEN QUALITY:: ADEQUATE

## 2024-09-12 ENCOUNTER — Other Ambulatory Visit: Payer: Self-pay | Admitting: Nurse Practitioner

## 2024-09-12 DIAGNOSIS — Z8673 Personal history of transient ischemic attack (TIA), and cerebral infarction without residual deficits: Secondary | ICD-10-CM

## 2024-09-12 DIAGNOSIS — I252 Old myocardial infarction: Secondary | ICD-10-CM

## 2024-09-12 DIAGNOSIS — E782 Mixed hyperlipidemia: Secondary | ICD-10-CM

## 2024-09-12 NOTE — Telephone Encounter (Unsigned)
 Copied from CRM #[id number]. Topic: Clinical - Medication Refill >> Sep 12, 2024  2:56 PM Shanda MATSU wrote: Medication: atorvastatin  (LIPITOR) 20 MG tablet  Has the patient contacted their pharmacy? No (Agent: If no, request that the patient contact the pharmacy for the refill. If patient does not wish to contact the pharmacy document the reason why and proceed with request.) (Agent: If yes, when and what did the pharmacy advise?)  This is the patient's preferred pharmacy:  [facility] - [place], [place] - [address] [place] [address] Phone: [phone] Fax: [phone]  Is this the correct pharmacy for this prescription? Yes If no, delete pharmacy and type the correct one.   Has the prescription been filled recently? Yes  Is the patient out of the medication? No  Has the patient been seen for an appointment in the last year OR does the patient have an upcoming appointment? Yes  Can we respond through MyChart? No  Agent: Please be advised that Rx refills may take up to 3 business days. We ask that you follow-up with your pharmacy.

## 2024-09-14 NOTE — Telephone Encounter (Signed)
 Requested Prescriptions  Refused Prescriptions Disp Refills   atorvastatin  (LIPITOR) 20 MG tablet 90 tablet 1    Sig: Take 1 tablet (20 mg total) by mouth daily.     Cardiovascular:  Antilipid - Statins Failed - 09/14/2024  2:24 PM      Failed - Lipid Panel in normal range within the last 12 months    Cholesterol  Date Value Ref Range Status  08/23/2024 154 <200 mg/dL Final   LDL Cholesterol (Calc)  Date Value Ref Range Status  08/23/2024 70 mg/dL (calc) Final    Comment:    Reference range: <100 . Desirable range <100 mg/dL for primary prevention;   <70 mg/dL for patients with CHD or diabetic patients  with > or = 2 CHD risk factors. SABRA LDL-C is now calculated using the Martin-Hopkins  calculation, which is a validated novel method providing  better accuracy than the Friedewald equation in the  estimation of LDL-C.  Gladis APPLETHWAITE et al. SANDREA. 7986;689(80): 2061-2068  (http://education.[facility].com/faq/FAQ164)    HDL  Date Value Ref Range Status  08/23/2024 66 > OR = 50 mg/dL Final   Triglycerides  Date Value Ref Range Status  08/23/2024 98 <150 mg/dL Final         Passed - Patient is not pregnant      Passed - Valid encounter within last 12 months    Recent Outpatient Visits           3 weeks ago Coronary artery disease involving native coronary artery of native heart without angina pectoris   [facility] Gareth Mliss FAL[place], FNP   7 months ago Confusion   [facility] Gareth Mliss [place], [place]

## 2024-09-15 NOTE — Telephone Encounter (Unsigned)
 Copied from CRM [id number]. Topic: Clinical - Medication Refill >> Sep 15, 2024  9:23 AM Lonell PEDLAR wrote: Medication:  enalapril  (VASOTEC ) 20 MG tablet  Has the patient contacted their pharmacy? No, devoted rep calling on behalf of pt for refilll  This is the patient's preferred pharmacy:  [facility] - [place], [place] - [address] [place] [address] Phone: [phone] Fax: [phone]  Is this the correct pharmacy for this prescription? Yes If no, delete pharmacy and type the correct one.   Has the prescription been filled recently? Yes  Is the patient out of the medication? Unknown, rep called for patient  Has the patient been seen for an appointment in the last year OR does the patient have an upcoming appointment? Yes  Can we respond through MyChart? Unknown, rep called for patient  Agent: Please be advised that Rx refills may take up to 3 business days. We ask that you follow-up with your pharmacy.

## 2024-10-09 ENCOUNTER — Other Ambulatory Visit: Payer: Self-pay | Admitting: Nurse Practitioner

## 2024-10-09 DIAGNOSIS — I252 Old myocardial infarction: Secondary | ICD-10-CM

## 2024-10-09 DIAGNOSIS — Z8673 Personal history of transient ischemic attack (TIA), and cerebral infarction without residual deficits: Secondary | ICD-10-CM

## 2024-10-09 DIAGNOSIS — E782 Mixed hyperlipidemia: Secondary | ICD-10-CM

## 2024-10-09 DIAGNOSIS — I10 Essential (primary) hypertension: Secondary | ICD-10-CM

## 2024-10-09 NOTE — Telephone Encounter (Unsigned)
 Copied from CRM [id number]. Topic: Clinical - Medication Refill >> Oct 09, 2024 11:14 AM Victoria B wrote: Medication: enalapril  (VASOTEC ) 20 MG tablet/ atorvastatin  (LIPITOR) 20 MG tablet  Has the patient contacted their pharmacy? No devoted hlth called in    This is the patient's preferred pharmacy:  [facility] - [place], [place] - [address] [place] [address] Phone: [phone] Fax: [phone]   Is this the correct pharmacy for this prescription? no    Has the prescription been filled recently? no  Is the patient out of the medication? no  Has the patient been seen for an appointment in the last year OR does the patient have an upcoming appointment? yes  Can we respond through MyChart? no  Agent: Please be advised that Rx refills may take up to 3 business days. We ask that you follow-up with your pharmacy.

## 2024-10-10 NOTE — Telephone Encounter (Signed)
 Requested Prescriptions  Refused Prescriptions Disp Refills   enalapril  (VASOTEC ) 20 MG tablet 90 tablet 1    Sig: Take 1 tablet (20 mg total) by mouth daily.     Cardiovascular:  ACE Inhibitors Passed - 10/10/2024  5:52 PM      Passed - Cr in normal range and within 180 days    Creat  Date Value Ref Range Status  08/23/2024 0.90 0.60 - 0.95 mg/dL Final         Passed - K in normal range and within 180 days    Potassium  Date Value Ref Range Status  08/23/2024 4.4 3.5 - 5.3 mmol/L Final         Passed - Patient is not pregnant      Passed - Last BP in normal range    BP Readings from Last 1 Encounters:  08/23/24 122/62         Passed - Valid encounter within last 6 months    Recent Outpatient Visits           1 month ago Coronary artery disease involving native coronary artery of native heart without angina pectoris   [facility] Gareth Mliss FALCON, FNP   8 months ago Confusion   [facility] Gareth Mliss FALCON, FNP               atorvastatin  (LIPITOR) 20 MG tablet 90 tablet 1    Sig: Take 1 tablet (20 mg total) by mouth daily.     Cardiovascular:  Antilipid - Statins Failed - 10/10/2024  5:52 PM      Failed - Lipid Panel in normal range within the last 12 months    Cholesterol  Date Value Ref Range Status  08/23/2024 154 <200 mg/dL Final   LDL Cholesterol (Calc)  Date Value Ref Range Status  08/23/2024 70 mg/dL (calc) Final    Comment:    Reference range: <100 . Desirable range <100 mg/dL for primary prevention;   <70 mg/dL for patients with CHD or diabetic patients  with > or = 2 CHD risk factors. SABRA LDL-C is now calculated using the Martin-Hopkins  calculation, which is a validated novel method providing  better accuracy than the Friedewald equation in the  estimation of LDL-C.  Gladis APPLETHWAITE et al. SANDREA. 7986;689(80): 2061-2068  (http://education.[facility].com/faq/FAQ164)    HDL  Date Value Ref  Range Status  08/23/2024 66 > OR = 50 mg/dL Final   Triglycerides  Date Value Ref Range Status  08/23/2024 98 <150 mg/dL Final         Passed - Patient is not pregnant      Passed - Valid encounter within last 12 months    Recent Outpatient Visits           1 month ago Coronary artery disease involving native coronary artery of native heart without angina pectoris   [facility] Gareth Mliss FALCON, FNP   8 months ago Confusion   [facility] Gareth Mliss [place], [place]

## 2024-10-28 ENCOUNTER — Emergency Department
Admission: EM | Admit: 2024-10-28 | Discharge: 2024-10-28 | Disposition: A | Discharge status: Home / Self Care | Attending: Emergency Medicine | Admitting: Emergency Medicine

## 2024-10-28 ENCOUNTER — Other Ambulatory Visit: Payer: Self-pay

## 2024-10-28 ENCOUNTER — Encounter: Payer: Self-pay | Admitting: Emergency Medicine

## 2024-10-28 DIAGNOSIS — I10 Essential (primary) hypertension: Secondary | ICD-10-CM | POA: Insufficient documentation

## 2024-10-28 DIAGNOSIS — N3090 Cystitis, unspecified without hematuria: Secondary | ICD-10-CM | POA: Insufficient documentation

## 2024-10-28 DIAGNOSIS — J449 Chronic obstructive pulmonary disease, unspecified: Secondary | ICD-10-CM | POA: Insufficient documentation

## 2024-10-28 DIAGNOSIS — E860 Dehydration: Secondary | ICD-10-CM | POA: Diagnosis not present

## 2024-10-28 DIAGNOSIS — Z951 Presence of aortocoronary bypass graft: Secondary | ICD-10-CM | POA: Diagnosis not present

## 2024-10-28 DIAGNOSIS — W06XXXA Fall from bed, initial encounter: Secondary | ICD-10-CM | POA: Insufficient documentation

## 2024-10-28 DIAGNOSIS — R42 Dizziness and giddiness: Secondary | ICD-10-CM | POA: Diagnosis present

## 2024-10-28 DIAGNOSIS — I2510 Atherosclerotic heart disease of native coronary artery without angina pectoris: Secondary | ICD-10-CM | POA: Diagnosis not present

## 2024-10-28 LAB — COMPREHENSIVE METABOLIC PANEL WITH GFR
ALT: 36 U/L (ref 0–44)
AST: 38 U/L (ref 15–41)
Albumin: 4 g/dL (ref 3.5–5.0)
Alkaline Phosphatase: 111 U/L (ref 38–126)
Anion gap: 11 (ref 5–15)
BUN: 19 mg/dL (ref 8–23)
CO2: 25 mmol/L (ref 22–32)
Calcium: 9.2 mg/dL (ref 8.9–10.3)
Chloride: 103 mmol/L (ref 98–111)
Creatinine, Ser: 1.08 mg/dL — ABNORMAL HIGH (ref 0.44–1.00)
GFR, Estimated: 52 mL/min — ABNORMAL LOW (ref >60)
Glucose, Bld: 100 mg/dL — ABNORMAL HIGH (ref 70–99)
Potassium: 3.7 mmol/L (ref 3.5–5.1)
Sodium: 139 mmol/L (ref 135–145)
Total Bilirubin: 0.4 mg/dL (ref 0.0–1.2)
Total Protein: 6.9 g/dL (ref 6.5–8.1)

## 2024-10-28 LAB — URINALYSIS, ROUTINE W REFLEX MICROSCOPIC
Bilirubin Urine: NEGATIVE
Glucose, UA: NEGATIVE mg/dL
Hgb urine dipstick: NEGATIVE
Ketones, ur: NEGATIVE mg/dL
Nitrite: POSITIVE — AB
Protein, ur: 30 mg/dL — AB
Specific Gravity, Urine: 1.016 (ref 1.005–1.030)
WBC, UA: >50 WBC/hpf (ref 0–5)
pH: 5 (ref 5.0–8.0)

## 2024-10-28 LAB — TROPONIN T, HIGH SENSITIVITY: Troponin T High Sensitivity: <15 ng/L (ref 0–19)

## 2024-10-28 LAB — CBC
HCT: 33.7 % — ABNORMAL LOW (ref 36.0–46.0)
Hemoglobin: 11.2 g/dL — ABNORMAL LOW (ref 12.0–15.0)
MCH: 30.4 pg (ref 26.0–34.0)
MCHC: 33.2 g/dL (ref 30.0–36.0)
MCV: 91.6 fL (ref 80.0–100.0)
Platelets: 282 K/uL (ref 150–400)
RBC: 3.68 MIL/uL — ABNORMAL LOW (ref 3.87–5.11)
RDW: 14.2 % (ref 11.5–15.5)
WBC: 9.8 K/uL (ref 4.0–10.5)
nRBC: 0 % (ref 0.0–0.2)

## 2024-10-28 MED ORDER — CEPHALEXIN 500 MG PO CAPS: 500.0000 mg | ORAL_CAPSULE | Freq: Two times a day (BID) | ORAL | 0 refills | Status: AC

## 2024-10-28 MED ORDER — SODIUM CHLORIDE 0.9 % IV SOLN
2.0000 g | Freq: Once | INTRAVENOUS | Status: AC
  Administered 2024-10-28: 2 g via INTRAVENOUS
  Filled 2024-10-28: qty 20

## 2024-10-28 MED ORDER — CEPHALEXIN 500 MG PO CAPS: 500.0000 mg | ORAL_CAPSULE | Freq: Two times a day (BID) | ORAL | 0 refills | Status: DC

## 2024-10-28 MED ORDER — SODIUM CHLORIDE 0.9 % IV BOLUS
500.0000 mL | Freq: Once | INTRAVENOUS | Status: AC
Start: 2024-10-28 — End: 2024-10-28
  Administered 2024-10-28: 500 mL via INTRAVENOUS

## 2024-10-28 NOTE — ED Provider Notes (Signed)
 [facility] Provider Note    Event Date/Time   First MD Initiated Contact with Patient 10/28/24 1500     (approximate)   History   Chief Complaint: Fall and Loss of Consciousness   HPI  Katherine Price is a 78 y.o. female with history of COPD, hyperlipidemia, anxiety who was brought to the ED after sliding out of bed today.  She reports that she felt well but lightheaded when she was getting up.  Friend on scene noted the patient to be unresponsive for a few minutes, after which patient returned to normal.  Patient denies any preceding illness or pain or other acute complaints, reports she feels back to normal.  Denies any vomiting, diarrhea.  Reports normal oral intake        Past Medical History:  Diagnosis Date   Anxiety    Arthritis    Chronic back pain    Chronic bronchitis (HCC)    COPD (chronic obstructive pulmonary disease) (HCC)    Coronary artery disease    Depression    Dyspnea    orthopnea   Heart murmur    History of peptic ulcer    Hyperlipidemia    Hypertension    Myocardial infarction (HCC)    Orthopnea    Pneumonia    no to pneumonia but yes to chronic bronchitis   Renal insufficiency    Wheezing     Current Outpatient Rx   Order #: [id number] Class: Normal   Order #: [id number] Class: Normal   Order #: [id number] Class: Print   Order #: [id number] Class: Normal   Order #: [id number] Class: Normal    Past Surgical History:  Procedure Laterality Date   ABDOMINAL HYSTERECTOMY     BLADDER SUSPENSION     CATARACT EXTRACTION W/PHACO Right 09/14/2019   Procedure: CATARACT EXTRACTION PHACO AND INTRAOCULAR LENS PLACEMENT (IOC), RIGHT;  Surgeon: Ferol Rogue, MD;  Location: [facility] ORS;  Service: Ophthalmology;  Laterality: Right;  Lot #[id number] H US : 01:10.7 CDE; 12.49   CATARACT EXTRACTION W/PHACO Left 10/19/2019   Procedure: CATARACT EXTRACTION PHACO AND INTRAOCULAR LENS PLACEMENT (IOC) LEFT VISION BLUE;  Surgeon: Ferol Rogue, MD;   Location: [facility] ORS;  Service: Ophthalmology;  Laterality: Left;  US  01:19.8 CDE 11.74 Fluid Pack Lot 3 [id number]   CORONARY ARTERY BYPASS GRAFT  2003   LACERATION REPAIR     of forehead   TIBIA FRACTURE SURGERY     rod implanted    Physical Exam   Triage Vital Signs: ED Triage Vitals  Encounter Vitals Group     BP 10/28/24 1432 (!) 120/55     Girls Systolic BP Percentile --      Girls Diastolic BP Percentile --      Boys Systolic BP Percentile --      Boys Diastolic BP Percentile --      Pulse Rate 10/28/24 1432 73     Resp 10/28/24 1432 18     Temp 10/28/24 1432 (!) 97.5 F (36.4 C)     Temp Source 10/28/24 1432 Oral     SpO2 10/28/24 1432 100 %     Weight 10/28/24 1430 91 lb 7.9 oz (41.5 kg)     Height 10/28/24 1430 5' 4 (1.626 m)     Head Circumference --      Peak Flow --      Pain Score 10/28/24 1430 0     Pain Loc --      Pain Education --  Exclude from Growth Chart --     Most recent vital signs: Vitals:   10/28/24 1730 10/28/24 1800  BP: 118/78 (!) 115/105  Pulse: 78 75  Resp:  18  Temp:    SpO2: 94% 100%    General: Awake, no distress.  CV:  Good peripheral perfusion.  Irregular rhythm, normal distal pulses Resp:  Normal effort.  Clear lungs Abd:  No distention.  Soft nontender Other:  Dry oral mucosa   ED Results / Procedures / Treatments   Labs (all labs ordered are listed, but only abnormal results are displayed) Labs Reviewed  COMPREHENSIVE METABOLIC PANEL WITH GFR - Abnormal; Notable for the following components:      Result Value   Glucose, Bld 100 (*)    Creatinine, Ser 1.08 (*)    GFR, Estimated 52 (*)    All other components within normal limits  CBC - Abnormal; Notable for the following components:   RBC 3.68 (*)    Hemoglobin 11.2 (*)    HCT 33.7 (*)    All other components within normal limits  URINALYSIS, ROUTINE W REFLEX MICROSCOPIC - Abnormal; Notable for the following components:   Color, Urine YELLOW (*)     APPearance CLOUDY (*)    Protein, ur 30 (*)    Nitrite POSITIVE (*)    Leukocytes,Ua SMALL (*)    Bacteria, UA MANY (*)    All other components within normal limits  TROPONIN T, HIGH SENSITIVITY     EKG Interpreted by me Sinus rhythm, rate 85. Frequent PACs. Normal axis and intervals, no acute ischemic changes.  Compared to prior EKG on 02/02/2024, no significant change in rhythm or morphology.   RADIOLOGY    PROCEDURES:  Procedures   MEDICATIONS ORDERED IN ED: Medications  sodium chloride  0.9 % bolus 500 mL (0 mLs Intravenous Stopped 10/28/24 1628)  cefTRIAXone  (ROCEPHIN ) 2 g in sodium chloride  0.9 % 100 mL IVPB (0 g Intravenous Stopped 10/28/24 1757)     IMPRESSION / MDM / ASSESSMENT AND PLAN / ED COURSE  I reviewed the triage vital signs and the nursing notes.  DDx: Dehydration/orthostatic symptoms, vagal episode, electrolyte abnormality, AKI, anemia.  Doubt ACS, PE, dissection, arrhythmia  Patient's presentation is most consistent with acute presentation with potential threat to life or bodily function.  Patient presents due to sliding out of bed, episode of decreased responsiveness which sounds like it is most likely near syncope/lightheadedness.  Exam suggest dehydration.  She is asymptomatic.  Will give IV fluids, check labs.   Clinical Course as of 10/28/24 1811  Sat Oct 28, 2024  1657 UA positive for UTI.  Other labs reassuring.  Will give Rocephin , check orthostatics. [PS]  [id number] Results discussed with daughter who is come to bedside, agrees with antibiotics for UTI and is comfortable with outpatient management.  Patient lives in assisted living. [PS]    Clinical Course User Index [PS] Viviann Pastor, MD     FINAL CLINICAL IMPRESSION(S) / ED DIAGNOSES   Final diagnoses:  Cystitis  Dehydration     Rx / DC Orders   ED Discharge Orders          Ordered    cephALEXin  (KEFLEX ) 500 MG capsule  2 times daily,   Status:  Discontinued        10/28/24  1810    cephALEXin  (KEFLEX ) 500 MG capsule  2 times daily        10/28/24 1811  Note:  This document was prepared using Dragon voice recognition software and may include unintentional dictation errors.   Viviann Pastor, MD 10/28/24 [phone]

## 2024-10-28 NOTE — ED Triage Notes (Signed)
 Pt via [facility] from home. Pt slid out of her bed today, friend said afterwards pt was unresponsive not answering question for a few minutes. On arrival, pt is A&Ox4 and pt states she has not complaints. Denies any blood thinners. Pt is did not hit her head. Pt able to answer all questions.

## 2024-10-28 NOTE — ED Notes (Signed)
 Urine sent

## 2025-02-20 ENCOUNTER — Ambulatory Visit: Admitting: Nurse Practitioner
